# Patient Record
Sex: Male | Born: 1960
Health system: Southern US, Community
[De-identification: ages and names within clinical notes are randomized; demographics above are authoritative.]

## PROBLEM LIST (undated history)

## (undated) DIAGNOSIS — K7460 Unspecified cirrhosis of liver: Secondary | ICD-10-CM

## (undated) HISTORY — DX: Unspecified cirrhosis of liver: K74.60

---

## 2006-06-24 ENCOUNTER — Emergency Department (HOSPITAL_COMMUNITY): Admission: EM | Admit: 2006-06-24 | Discharge: 2006-06-24 | Payer: Self-pay | Admitting: Emergency Medicine

## 2006-06-27 ENCOUNTER — Emergency Department (HOSPITAL_COMMUNITY): Admission: EM | Admit: 2006-06-27 | Discharge: 2006-06-27 | Payer: Self-pay | Admitting: Emergency Medicine

## 2011-08-03 ENCOUNTER — Emergency Department (HOSPITAL_COMMUNITY)
Admission: EM | Admit: 2011-08-03 | Discharge: 2011-08-03 | Disposition: A | Discharge status: Home / Self Care | Payer: Self-pay | Attending: Emergency Medicine | Admitting: Emergency Medicine

## 2011-08-03 ENCOUNTER — Encounter: Payer: Self-pay | Admitting: Emergency Medicine

## 2011-08-03 DIAGNOSIS — L03039 Cellulitis of unspecified toe: Secondary | ICD-10-CM | POA: Insufficient documentation

## 2011-08-03 DIAGNOSIS — L02619 Cutaneous abscess of unspecified foot: Secondary | ICD-10-CM | POA: Insufficient documentation

## 2011-08-03 DIAGNOSIS — M7989 Other specified soft tissue disorders: Secondary | ICD-10-CM | POA: Insufficient documentation

## 2011-08-03 DIAGNOSIS — L02611 Cutaneous abscess of right foot: Secondary | ICD-10-CM

## 2011-08-03 DIAGNOSIS — M79609 Pain in unspecified limb: Secondary | ICD-10-CM | POA: Insufficient documentation

## 2011-08-03 DIAGNOSIS — F17200 Nicotine dependence, unspecified, uncomplicated: Secondary | ICD-10-CM | POA: Insufficient documentation

## 2011-08-03 MED ORDER — SULFAMETHOXAZOLE-TRIMETHOPRIM 800-160 MG PO TABS: 1.0000 | ORAL_TABLET | Freq: Two times a day (BID) | ORAL | Status: AC

## 2011-08-03 MED ORDER — OXYCODONE-ACETAMINOPHEN 5-325 MG PO TABS
1.0000 | ORAL_TABLET | Freq: Once | ORAL | Status: AC
  Administered 2011-08-03: 1 via ORAL
  Filled 2011-08-03: qty 1

## 2011-08-03 MED ORDER — OXYCODONE-ACETAMINOPHEN 5-325 MG PO TABS: 1.0000 | ORAL_TABLET | ORAL | Status: DC | PRN

## 2011-08-03 NOTE — ED Provider Notes (Signed)
History     CSN: [id number]  Arrival date & time 08/03/11  0806   First MD Initiated Contact with Patient 08/03/11 0815      Chief Complaint  Patient presents with  . Foot Pain    Pt has redness and swelling to Right foot for 2-3 weeks.   HPI Pt is a 50 year old male with past medical history notable for a right great toe laceration several years ago.  This was repaired and he has formed a callous there.  Three weeks ago he changed shoes and developed a blister in that area.  It spontaneously drained several days later and, since then, has been non-healing.  He reports a small amount of drainage from the area but increasing redness and pain.  He has been putting peroxide and antibiotic ointment on it and decided to come in due to pain.  He does not report any fevers/chills, n/v/d or other systemic symptoms.  History reviewed. No pertinent past medical history.  History reviewed. No pertinent past surgical history.  History reviewed. No pertinent family history.  History  Substance Use Topics  . Smoking status: Current Everyday Smoker  . Smokeless tobacco: Never Used  . Alcohol Use: Yes      Review of Systems  Constitutional: Negative.   HENT: Negative.   Eyes: Negative.   Respiratory: Negative.   Cardiovascular: Negative.   Gastrointestinal: Negative.   Genitourinary: Negative.   Musculoskeletal:       Pain in the area of the right great toe, swelling there and erythema  Skin:       See MSK  Neurological: Negative.   Hematological: Negative.   Psychiatric/Behavioral: Negative.     Allergies  Review of patient's allergies indicates no known allergies.  Home Medications  No current outpatient prescriptions on file.  BP 110/95  Pulse 102  Temp(Src) 98.3 F (36.8 C) (Oral)  Resp 18  SpO2 97%  Physical Exam  Constitutional: He is oriented to person, place, and time. He appears well-developed and well-nourished.  HENT:  Head: Normocephalic and atraumatic.    Right Ear: External ear normal.  Left Ear: External ear normal.  Nose: Nose normal.  Mouth/Throat: Oropharynx is clear and moist.  Eyes: Conjunctivae and EOM are normal.  Neck: Normal range of motion.  Cardiovascular: Normal rate, regular rhythm, normal heart sounds and intact distal pulses.   Pulmonary/Chest: Effort normal and breath sounds normal.  Abdominal: Soft. Bowel sounds are normal.  Musculoskeletal:       Feet:  Neurological: He is alert and oriented to person, place, and time. No cranial nerve deficit.  Skin: Skin is warm and dry.  Psychiatric: He has a normal mood and affect.    ED Course  INCISION AND DRAINAGE Date/Time: 08/03/2011 10:00 AM Performed by: Majel Homer Authorized by: Majel Homer Consent: Verbal consent obtained. Written consent not obtained. Risks and benefits: risks, benefits and alternatives were discussed Consent given by: patient Patient understanding: patient states understanding of the procedure being performed Patient consent: the patient's understanding of the procedure matches consent given Procedure consent: procedure consent matches procedure scheduled Relevant documents: relevant documents present and verified Site marked: the operative site was marked Imaging studies: imaging studies not available Patient identity confirmed: verbally with patient and arm band Time out: Immediately prior to procedure a "time out" was called to verify the correct patient, procedure, equipment, support staff and site/side marked as required. Type: abscess Body area: lower extremity Location details: left big toe Local  anesthetic: lidocaine 2% without epinephrine Anesthetic total: 2.5 ml Patient sedated: no Scalpel size: 11 Needle gauge: 22 Incision type: single straight Complexity: simple Drainage: bloody and purulent Drainage amount: scant Wound treatment: wound left open Packing material: none Patient tolerance: Patient tolerated the procedure  well with no immediate complications.   (including critical care time)  Labs Reviewed - No data to display No results found.   No diagnosis found.    MDM  I+D performed.  Will send home with pain meds and Abx for 7 days.        Majel Homer, MD 08/03/11 [phone]

## 2011-08-03 NOTE — ED Provider Notes (Signed)
I saw and evaluated the patient, reviewed the resident's note and I agree with the findings and plan. 50 year old male complains of pain and swelling over right great toe for a few days.  Denies trauma.  No fever.  No significant past medical history.  He says it began as a blister and progressed.  On examination.  He appears in moderate discomfort.  There is significant erythema.  Tenderness over the right first MTP joint.  There is purulence discharge.  Overlying lesion.  Differential is an abscess versus gallops.  We will perform an I&D and then placed on antibiotics and analgesics.  Nicholes Stairs, MD 08/03/11 1018

## 2011-08-03 NOTE — ED Notes (Signed)
Pt presents with redness and swelling to right foot/big toe area ongoing x 2-3 weeks.

## 2011-08-03 NOTE — ED Notes (Signed)
Pt staes that he has large bump on top  Of  rt foot  X 3 days  Has gotten increasingly swollon and hot to touch has green pus pus

## 2011-08-03 NOTE — ED Provider Notes (Signed)
I saw and evaluated the patient, reviewed the resident's note and I agree with the findings and plan.  Nicholes Stairs, MD 08/03/11 1025

## 2011-08-09 ENCOUNTER — Emergency Department (INDEPENDENT_AMBULATORY_CARE_PROVIDER_SITE_OTHER)
Admission: EM | Admit: 2011-08-09 | Discharge: 2011-08-09 | Disposition: A | Discharge status: Home / Self Care | Payer: Self-pay | Source: Home / Self Care | Attending: Emergency Medicine | Admitting: Emergency Medicine

## 2011-08-09 ENCOUNTER — Encounter (HOSPITAL_COMMUNITY): Payer: Self-pay | Admitting: *Deleted

## 2011-08-09 DIAGNOSIS — L02619 Cutaneous abscess of unspecified foot: Secondary | ICD-10-CM

## 2011-08-09 DIAGNOSIS — L03115 Cellulitis of right lower limb: Secondary | ICD-10-CM

## 2011-08-09 MED ORDER — DOXYCYCLINE HYCLATE 100 MG PO TABS: 100.0000 mg | ORAL_TABLET | Freq: Two times a day (BID) | ORAL | Status: AC

## 2011-08-09 MED ORDER — OXYCODONE-ACETAMINOPHEN 5-325 MG PO TABS: 1.0000 | ORAL_TABLET | ORAL | Status: AC | PRN

## 2011-08-09 NOTE — ED Notes (Signed)
Pt  Seen  6     Days  Ago  Had  I   And  D       Pt      Took  A  Course  Of  Anti  Biotics         Pt  Reports  The  Foot  Is  Swollen  And  Not  Getting  Any  Better

## 2011-08-09 NOTE — ED Provider Notes (Signed)
Edwin West is a 50 year old male with a right foot cellulitis. He was evaluated on 25 December the emergency room for abscess. The abscess was drained and he was prescribed Bactrim and Percocet for one week. He completed his course of antibiotics and pain medicines yesterday. He notes that his pain returned upon cessation of antibiotics.  He notes that the swelling did improve with antibiotics as did the redness of his foot.  He denies any fevers or chills.  He is able to walk but notes that his foot is swollen enough that he can't get into regular shoes.    PMH reviewed.  ROS as above otherwise neg Medications reviewed. No current facility-administered medications for this encounter.   Current Outpatient Prescriptions  Medication Sig Dispense Refill  . doxycycline (VIBRA-TABS) 100 MG tablet Take 1 tablet (100 mg total) by mouth 2 (two) times daily.  20 tablet  0  . oxyCODONE-acetaminophen (ROXICET) 5-325 MG per tablet Take 1 tablet by mouth every 4 (four) hours as needed for pain.  25 tablet  0  . sulfamethoxazole-trimethoprim (BACTRIM DS) 800-160 MG per tablet Take 1 tablet by mouth 2 (two) times daily.  14 tablet  0    Exam:  BP 114/78  Pulse 84  Temp(Src) 98.3 F (36.8 C) (Oral)  Resp 18  SpO2 98% Gen: Well NAD Lungs: CTABL Nl WOB Heart: RRR no MRG Exts: Right foot swollen with eschar on the dorsum of right first MTP.  Small area of erythema surrounding.  No fluctuance or induration noted.  Tender to palpation. All 5 toes are neurovascularly intact with good capillary refill.  Normal foot motion.  Assessment and plan: 50 year old male with partially treated cellulitis.  I think his treatment course was not long enough.  Plan to use doxycycline twice a day for 10 days and prescribe pain medicine as needed.  Additionally I provided information for Dr. Lorenso Quarry office so that he may schedule an appointment to be seen in the next few days.  I went over red flag signs or symptoms with Mr.  Heffington in detail. I discussed signs of worsening cellulitis and abscess or systemic bacterial infection. He expresses understanding.  I specifically said that if he does not improve in the next few days he should see a doctor.    Edwin West 08/09/11 2016

## 2011-08-09 NOTE — ED Provider Notes (Signed)
Medical screening examination/treatment/procedure(s) were performed by a resident physician and as supervising physician I was immediately available for consultation/collaboration.  Hillery Hunter, MD 08/09/11 [phone]

## 2011-08-09 NOTE — ED Notes (Signed)
Called in lobby x1.  

## 2017-02-17 ENCOUNTER — Encounter (HOSPITAL_COMMUNITY): Payer: Self-pay | Admitting: Emergency Medicine

## 2017-02-17 ENCOUNTER — Ambulatory Visit (HOSPITAL_COMMUNITY)
Admission: EM | Admit: 2017-02-17 | Discharge: 2017-02-17 | Disposition: A | Discharge status: Home / Self Care | Payer: Commercial Managed Care - PPO | Attending: Emergency Medicine | Admitting: Emergency Medicine

## 2017-02-17 DIAGNOSIS — R110 Nausea: Secondary | ICD-10-CM

## 2017-02-17 DIAGNOSIS — R319 Hematuria, unspecified: Secondary | ICD-10-CM

## 2017-02-17 DIAGNOSIS — N200 Calculus of kidney: Secondary | ICD-10-CM

## 2017-02-17 DIAGNOSIS — R1010 Upper abdominal pain, unspecified: Secondary | ICD-10-CM | POA: Diagnosis not present

## 2017-02-17 LAB — POCT URINALYSIS DIP (DEVICE)
BILIRUBIN URINE: NEGATIVE
GLUCOSE, UA: NEGATIVE mg/dL
KETONES UR: NEGATIVE mg/dL
LEUKOCYTES UA: NEGATIVE
Nitrite: NEGATIVE
Protein, ur: NEGATIVE mg/dL
SPECIFIC GRAVITY, URINE: 1.02 (ref 1.005–1.030)
Urobilinogen, UA: 1 mg/dL (ref 0.0–1.0)
pH: 7 (ref 5.0–8.0)

## 2017-02-17 MED ORDER — KETOROLAC TROMETHAMINE 60 MG/2ML IM SOLN
60.0000 mg | Freq: Once | INTRAMUSCULAR | Status: AC
  Administered 2017-02-17: 60 mg via INTRAMUSCULAR

## 2017-02-17 MED ORDER — TAMSULOSIN HCL 0.4 MG PO CAPS: 0.4000 mg | ORAL_CAPSULE | Freq: Every day | ORAL | 0 refills | Status: DC

## 2017-02-17 MED ORDER — ONDANSETRON 4 MG PO TBDP
ORAL_TABLET | ORAL | Status: AC
  Filled 2017-02-17: qty 1

## 2017-02-17 MED ORDER — ONDANSETRON 4 MG PO TBDP
4.0000 mg | ORAL_TABLET | Freq: Once | ORAL | Status: AC
  Administered 2017-02-17: 4 mg via ORAL

## 2017-02-17 MED ORDER — KETOROLAC TROMETHAMINE 60 MG/2ML IM SOLN
INTRAMUSCULAR | Status: AC
  Filled 2017-02-17: qty 2

## 2017-02-17 MED ORDER — HYDROCODONE-ACETAMINOPHEN 5-325 MG PO TABS: 1.0000 | ORAL_TABLET | ORAL | 0 refills | Status: DC | PRN

## 2017-02-17 MED ORDER — ONDANSETRON 4 MG PO TBDP: 4.0000 mg | ORAL_TABLET | Freq: Three times a day (TID) | ORAL | 0 refills | Status: DC | PRN

## 2017-02-17 NOTE — Discharge Instructions (Signed)
If pain worsens or if unable to void please go to ED

## 2017-02-17 NOTE — ED Provider Notes (Signed)
CSN: [id number]     Arrival date & time 02/17/17  1835 History   None    Chief Complaint  Patient presents with  . Abdominal Pain   (Consider location/radiation/quality/duration/timing/severity/associated sxs/prior Treatment) Patient c/o abdominal pain on right side and nausea.  Patient states the pain is persistent and severe.  He is having nausea.   The history is provided by the patient.  Abdominal Pain  Pain location:  R flank Pain quality: aching   Pain severity:  Severe Onset quality:  Sudden Duration:  4 hours Timing:  Constant Progression:  Worsening Chronicity:  New Ineffective treatments:  None tried Associated symptoms: fatigue and nausea   Risk factors: obesity     History reviewed. No pertinent past medical history. History reviewed. No pertinent surgical history. History reviewed. No pertinent family history. Social History  Substance Use Topics  . Smoking status: Current Every Day Smoker  . Smokeless tobacco: Never Used  . Alcohol use Yes    Review of Systems  Constitutional: Positive for fatigue.  HENT: Negative.   Eyes: Negative.   Respiratory: Negative.   Cardiovascular: Negative.   Gastrointestinal: Positive for abdominal pain and nausea.  Endocrine: Negative.   Genitourinary: Negative.   Musculoskeletal: Negative.   Allergic/Immunologic: Negative.   Neurological: Negative.   Hematological: Negative.   Psychiatric/Behavioral: Negative.     Allergies  Patient has no known allergies.  Home Medications   Prior to Admission medications   Medication Sig Start Date End Date Taking? Authorizing Provider  HYDROcodone-acetaminophen (NORCO/VICODIN) 5-325 MG tablet Take 1-2 tablets by mouth every 4 (four) hours as needed. 02/17/17   Deatra Canter, FNP  ondansetron (ZOFRAN ODT) 4 MG disintegrating tablet Take 1 tablet (4 mg total) by mouth every 8 (eight) hours as needed for nausea or vomiting. 02/17/17   Deatra Canter, FNP  tamsulosin  (FLOMAX) 0.4 MG CAPS capsule Take 1 capsule (0.4 mg total) by mouth daily. 02/17/17   Deatra Canter, FNP   Meds Ordered and Administered this Visit   Medications  ketorolac (TORADOL) injection 60 mg (not administered)  ondansetron (ZOFRAN-ODT) disintegrating tablet 4 mg (not administered)    BP 116/75 (BP Location: Right Arm)   Pulse 94   Temp 98.1 F (36.7 C) (Oral)   Resp 20   SpO2 97%  No data found.   Physical Exam  Constitutional: He is oriented to person, place, and time. He appears well-developed and well-nourished.  HENT:  Head: Normocephalic and atraumatic.  Eyes: Pupils are equal, round, and reactive to light. Conjunctivae and EOM are normal.  Neck: Normal range of motion. Neck supple.  Cardiovascular: Normal rate, regular rhythm and normal heart sounds.   Pulmonary/Chest: Effort normal and breath sounds normal.  Abdominal: Soft. Bowel sounds are normal. There is tenderness.  Right abdomen tender.  Neg Murphy's  Genitourinary:  Genitourinary Comments: Positive right CVA tenderness.  Neurological: He is alert and oriented to person, place, and time.  Nursing note and vitals reviewed.   Urgent Care Course     Procedures (including critical care time)  Labs Review Labs Reviewed  POCT URINALYSIS DIP (DEVICE) - Abnormal; Notable for the following:       Result Value   Hgb urine dipstick TRACE (*)    All other components within normal limits    Imaging Review No results found.   Visual Acuity Review  Right Eye Distance:   Left Eye Distance:   Bilateral Distance:    Right Eye Near:  Left Eye Near:    Bilateral Near:         MDM   1. Hematuria, unspecified type   2. Pain of upper abdomen   3. Kidney stone   4. Nausea without vomiting    Toradol 60 mg IM Zofran 4mg  po now  Norco 5/325 one to two po q 6 hours prn pain #12 Zofran ODT 4mg  one po tid prn #21  Push po fluids     Deatra CanterOxford, William J, [facility] LtdFNP 02/17/17 2043

## 2017-02-17 NOTE — ED Triage Notes (Signed)
Pt here for constant RUQ pain onset last night associated w/nasuea  Denies fevers, chills  Pain increases w/activity, cough, pressure, w/food  No hx of abd surgeries   LBM = today, no abnormalities noted.   A&O x4... NAD... Ambulatory... Slow gait

## 2017-02-17 NOTE — ED Notes (Signed)
Patient verbalized understanding of discharge instructions and denies any further needs or questions at this time. VS stable. Patient ambulatory with steady gait.  

## 2017-05-10 ENCOUNTER — Encounter (HOSPITAL_COMMUNITY): Payer: Self-pay | Admitting: Family Medicine

## 2017-05-10 ENCOUNTER — Ambulatory Visit (HOSPITAL_COMMUNITY)
Admission: EM | Admit: 2017-05-10 | Discharge: 2017-05-10 | Disposition: A | Discharge status: Home / Self Care | Payer: Commercial Managed Care - PPO | Attending: Family Medicine | Admitting: Family Medicine

## 2017-05-10 ENCOUNTER — Ambulatory Visit (INDEPENDENT_AMBULATORY_CARE_PROVIDER_SITE_OTHER): Payer: Commercial Managed Care - PPO

## 2017-05-10 DIAGNOSIS — R911 Solitary pulmonary nodule: Secondary | ICD-10-CM | POA: Diagnosis not present

## 2017-05-10 DIAGNOSIS — J40 Bronchitis, not specified as acute or chronic: Secondary | ICD-10-CM

## 2017-05-10 DIAGNOSIS — R05 Cough: Secondary | ICD-10-CM | POA: Diagnosis not present

## 2017-05-10 DIAGNOSIS — R0602 Shortness of breath: Secondary | ICD-10-CM

## 2017-05-10 MED ORDER — AZITHROMYCIN 250 MG PO TABS: 250.0000 mg | ORAL_TABLET | Freq: Every day | ORAL | 0 refills | Status: DC

## 2017-05-10 MED ORDER — HYDROCODONE-HOMATROPINE 5-1.5 MG/5ML PO SYRP: 5.0000 mL | ORAL_SOLUTION | Freq: Four times a day (QID) | ORAL | 0 refills | Status: DC | PRN

## 2017-05-10 NOTE — ED Triage Notes (Addendum)
Patient reports right sided rib cage pain radiating into back, onset on Saturday. Patient reports he had cough/cold like symptoms x 2 weeks. No fevers.   Denies injury. Pain increases with coughing and certain movement.

## 2017-05-10 NOTE — Discharge Instructions (Signed)
You have a pulmonary nodule in your left lung. This is not causing her pain but it does require further investigation when you're feeling better.  Please make an appointment with your primary care doctor or the clinic listed below.

## 2017-05-10 NOTE — ED Provider Notes (Signed)
[facility] CARE CENTER   [id number] 05/10/17 Arrival Time: 1330   SUBJECTIVE:  Edwin West is a 56 y.o. male who presents to the urgent care with complaint of right sided rib cage pain radiating into back, onset on Saturday. Patient reports he had cough/cold like symptoms x 2 weeks. No fevers.   Patient is a smoker.  He works as a Academic librarian. Pain is described as burning and sharp, coming on as the cough started to worsen three days ago.  No shortness of breath     History reviewed. No pertinent past medical history. History reviewed. No pertinent family history. Social History   Social History  . Marital status: Single    Spouse name: N/A  . Number of children: N/A  . Years of education: N/A   Occupational History  . Not on file.   Social History Main Topics  . Smoking status: Current Every Day Smoker  . Smokeless tobacco: Never Used  . Alcohol use Yes  . Drug use: Yes    Types: Marijuana  . Sexual activity: Not on file   Other Topics Concern  . Not on file   Social History Narrative  . No narrative on file   No outpatient prescriptions have been marked as taking for the 05/10/17 encounter [facility] Encounter).   No Known Allergies    ROS: As per HPI, remainder of ROS negative.   OBJECTIVE:   Vitals:   05/10/17 1350  BP: (!) 145/84  Pulse: 94  Resp: 18  Temp: 98 F (36.7 C)  TempSrc: Oral  SpO2: 95%     General appearance: alert; no distress;  Patient seated apprehensively avoiding deep breath Eyes: PERRL; EOMI; conjunctiva normal HENT: normocephalic; atraumatic; TMs normal, canal normal, external ears normal without trauma; nasal mucosa normal; oral mucosa normal Neck: supple Lungs: bibasilar rales with tender right mid and lower ribs anteriorly and laterally Heart: regular rate and rhythm Back: no CVA tenderness Extremities: no cyanosis or edema; symmetrical with no gross deformities Skin: warm and dry Neurologic: normal gait; grossly  normal Psychological: alert and cooperative; normal mood and affect      Labs:  Results for orders placed or performed during the hospital encounter of 02/17/17  POCT urinalysis dip (device)  Result Value Ref Range   Glucose, UA NEGATIVE NEGATIVE mg/dL   Bilirubin Urine NEGATIVE NEGATIVE   Ketones, ur NEGATIVE NEGATIVE mg/dL   Specific Gravity, Urine 1.020 1.005 - 1.030   Hgb urine dipstick TRACE (A) NEGATIVE   pH 7.0 5.0 - 8.0   Protein, ur NEGATIVE NEGATIVE mg/dL   Urobilinogen, UA 1.0 0.0 - 1.0 mg/dL   Nitrite NEGATIVE NEGATIVE   Leukocytes, UA NEGATIVE NEGATIVE    Labs Reviewed - No data to display  Dg Chest 2 View  Result Date: 05/10/2017 CLINICAL DATA:  Cough for 1 week, worsened over the last 3 days. Shortness of breath. EXAM: CHEST  2 VIEW COMPARISON:  None. FINDINGS: No pneumothorax. Coarsened interstitium is seen bilaterally. A nodular density in the lateral left lung base measures up to 7.5 mm. No other nodules or masses. No focal infiltrates. Minimal atelectasis in the lateral left lung base. The heart, hila, and mediastinum are normal. IMPRESSION: 1. There is a 7.5 mm nodular density in the lateral left lung base. Given history of smoking, recommend a CT scan for better evaluation if there are no previous studies for comparison. 2. Coarsened interstitium likely represents bronchitic change or sequela of smoking. 3. No other acute abnormalities.  These results will be called to the ordering clinician or representative by the Radiologist Assistant, and communication documented in the PACS or zVision Dashboard. Electronically Signed   By: Gerome Sam III M.D   On: 05/10/2017 14:25       ASSESSMENT & PLAN:  1. Bronchitis   2. Solitary pulmonary nodule     Meds ordered this encounter  Medications  . HYDROcodone-homatropine (HYDROMET) 5-1.5 MG/5ML syrup    Sig: Take 5 mLs by mouth every 6 (six) hours as needed for cough.    Dispense:  60 mL    Refill:  0  .  azithromycin (ZITHROMAX) 250 MG tablet    Sig: Take 1 tablet (250 mg total) by mouth daily. Take first 2 tablets together, then 1 every day until finished.    Dispense:  6 tablet    Refill:  0    Reviewed expectations re: course of current medical issues. Questions answered. Outlined signs and symptoms indicating need for more acute intervention. Patient verbalized understanding. After Visit Summary given.    Procedures:      Elvina Sidle, MD 05/10/17 1438

## 2017-05-11 ENCOUNTER — Telehealth (HOSPITAL_COMMUNITY): Payer: Self-pay | Admitting: Emergency Medicine

## 2017-05-11 NOTE — Telephone Encounter (Signed)
Pt called  Sts he was seen yest (05/10/17) and was told by Dr. Milus Glazier to call back if he was still in pain so he could call in pain meds  Notified pt that he is not here and wont be here until 10/14  Pt was upset... sts he now has to return to [facility] ED or here at the [facility] and waste more of his money.   I apologized for the confusion... But hanged the phone.

## 2017-09-06 DIAGNOSIS — B349 Viral infection, unspecified: Secondary | ICD-10-CM | POA: Diagnosis not present

## 2017-09-06 DIAGNOSIS — J029 Acute pharyngitis, unspecified: Secondary | ICD-10-CM | POA: Diagnosis not present

## 2018-04-03 ENCOUNTER — Emergency Department
Admission: EM | Admit: 2018-04-03 | Discharge: 2018-04-03 | Disposition: A | Discharge status: Home / Self Care | Payer: Commercial Managed Care - PPO | Attending: Emergency Medicine | Admitting: Emergency Medicine

## 2018-04-03 ENCOUNTER — Emergency Department: Payer: Commercial Managed Care - PPO

## 2018-04-03 ENCOUNTER — Encounter: Payer: Self-pay | Admitting: Emergency Medicine

## 2018-04-03 DIAGNOSIS — R188 Other ascites: Secondary | ICD-10-CM | POA: Diagnosis not present

## 2018-04-03 DIAGNOSIS — K7460 Unspecified cirrhosis of liver: Secondary | ICD-10-CM

## 2018-04-03 DIAGNOSIS — R140 Abdominal distension (gaseous): Secondary | ICD-10-CM

## 2018-04-03 DIAGNOSIS — J439 Emphysema, unspecified: Secondary | ICD-10-CM | POA: Diagnosis not present

## 2018-04-03 DIAGNOSIS — R609 Edema, unspecified: Secondary | ICD-10-CM | POA: Diagnosis not present

## 2018-04-03 DIAGNOSIS — F17210 Nicotine dependence, cigarettes, uncomplicated: Secondary | ICD-10-CM | POA: Diagnosis not present

## 2018-04-03 DIAGNOSIS — Z79899 Other long term (current) drug therapy: Secondary | ICD-10-CM | POA: Diagnosis not present

## 2018-04-03 DIAGNOSIS — R600 Localized edema: Secondary | ICD-10-CM | POA: Diagnosis not present

## 2018-04-03 LAB — CBC
HCT: 30.2 % — ABNORMAL LOW (ref 40.0–52.0)
HEMOGLOBIN: 10.2 g/dL — AB (ref 13.0–18.0)
MCH: 35.8 pg — ABNORMAL HIGH (ref 26.0–34.0)
MCHC: 33.7 g/dL (ref 32.0–36.0)
MCV: 106.2 fL — ABNORMAL HIGH (ref 80.0–100.0)
PLATELETS: 209 10*3/uL (ref 150–440)
RBC: 2.84 MIL/uL — ABNORMAL LOW (ref 4.40–5.90)
RDW: 15.6 % — ABNORMAL HIGH (ref 11.5–14.5)
WBC: 5.9 10*3/uL (ref 3.8–10.6)

## 2018-04-03 LAB — COMPREHENSIVE METABOLIC PANEL
ALK PHOS: 503 U/L — AB (ref 38–126)
ALT: 31 U/L (ref 0–44)
AST: 52 U/L — AB (ref 15–41)
Albumin: 2.7 g/dL — ABNORMAL LOW (ref 3.5–5.0)
Anion gap: 5 (ref 5–15)
BILIRUBIN TOTAL: 1.3 mg/dL — AB (ref 0.3–1.2)
BUN: 13 mg/dL (ref 6–20)
CALCIUM: 8.1 mg/dL — AB (ref 8.9–10.3)
CHLORIDE: 109 mmol/L (ref 98–111)
CO2: 23 mmol/L (ref 22–32)
CREATININE: 0.69 mg/dL (ref 0.61–1.24)
Glucose, Bld: 108 mg/dL — ABNORMAL HIGH (ref 70–99)
Potassium: 3.8 mmol/L (ref 3.5–5.1)
Sodium: 137 mmol/L (ref 135–145)
TOTAL PROTEIN: 6.7 g/dL (ref 6.5–8.1)

## 2018-04-03 LAB — URINALYSIS, COMPLETE (UACMP) WITH MICROSCOPIC
BACTERIA UA: NONE SEEN
Bilirubin Urine: NEGATIVE
GLUCOSE, UA: NEGATIVE mg/dL
HGB URINE DIPSTICK: NEGATIVE
Ketones, ur: NEGATIVE mg/dL
Leukocytes, UA: NEGATIVE
NITRITE: NEGATIVE
PH: 5 (ref 5.0–8.0)
Protein, ur: 100 mg/dL — AB
SPECIFIC GRAVITY, URINE: 1.029 (ref 1.005–1.030)

## 2018-04-03 LAB — LIPASE, BLOOD: Lipase: 32 U/L (ref 11–51)

## 2018-04-03 MED ORDER — FUROSEMIDE 20 MG PO TABS: 20.0000 mg | ORAL_TABLET | Freq: Every day | ORAL | 0 refills | Status: DC

## 2018-04-03 MED ORDER — FUROSEMIDE 10 MG/ML IJ SOLN
60.0000 mg | Freq: Once | INTRAMUSCULAR | Status: AC
  Administered 2018-04-03: 60 mg via INTRAVENOUS
  Filled 2018-04-03: qty 8

## 2018-04-03 NOTE — ED Notes (Signed)
Awaiting GI appointment to be set up before discharge.

## 2018-04-03 NOTE — ED Provider Notes (Signed)
[facility] Emergency Department Provider Note   ____________________________________________   I have reviewed the triage vital signs and the nursing notes.   HISTORY  Chief Complaint Leg Swelling and Shortness of Breath   History limited by: Not Limited   HPI Edwin West is a 57 y.o. male who presents to the emergency department today because of concern for swelling.  He states the swelling started about a week ago.  He states he has had some swelling in his legs in the past but over the past week not only has had swelling in the legs but also in his abdomen.  He states this has been coming by some shortness of breath.  It is worse when he lies flat.  He denies any history of liver kidney or heart disease.  States he does have a fairly significant alcohol use history.  Denies any fevers.  Denies any abdominal pain.   Per medical record review patient has a history of alcohol use  History reviewed. No pertinent past medical history.  There are no active problems to display for this patient.   History reviewed. No pertinent surgical history.  Prior to Admission medications   Medication Sig Start Date End Date Taking? Authorizing Provider  azithromycin (ZITHROMAX) 250 MG tablet Take 1 tablet (250 mg total) by mouth daily. Take first 2 tablets together, then 1 every day until finished. 05/10/17   Elvina SidleLauenstein, Kurt, MD  HYDROcodone-homatropine (HYDROMET) 5-1.5 MG/5ML syrup Take 5 mLs by mouth every 6 (six) hours as needed for cough. 05/10/17   Elvina SidleLauenstein, Kurt, MD  tamsulosin (FLOMAX) 0.4 MG CAPS capsule Take 1 capsule (0.4 mg total) by mouth daily. 02/17/17   Deatra Canterxford, William J, FNP    Allergies Patient has no known allergies.  No family history on file.  Social History Social History   Tobacco Use  . Smoking status: Current Every Day Smoker  . Smokeless tobacco: Never Used  Substance Use Topics  . Alcohol use: Yes  . Drug use: Yes    Types:  Marijuana    Review of Systems Constitutional: No fever/chills Eyes: No visual changes. ENT: No sore throat. Cardiovascular: Denies chest pain. Respiratory: Positive for shortness of breath. Gastrointestinal: Positive for abdominal distention. Genitourinary: Negative for dysuria. Musculoskeletal: Negative for back pain. Skin: Negative for rash. Neurological: Negative for headaches, focal weakness or numbness.  ____________________________________________   PHYSICAL EXAM:  VITAL SIGNS: ED Triage Vitals  Enc Vitals Group     BP 04/03/18 1126 115/81     Pulse Rate 04/03/18 1126 81     Resp 04/03/18 1126 20     Temp 04/03/18 1126 97.6 F (36.4 C)     Temp Source 04/03/18 1126 Oral     SpO2 04/03/18 1126 100 %     Weight 04/03/18 1126 235 lb (106.6 kg)     Height 04/03/18 1126 5\' 9"  (1.753 m)     Head Circumference --      Peak Flow --      Pain Score 04/03/18 1130 3   Constitutional: Alert and oriented.  Eyes: Conjunctivae are normal.  ENT      Head: Normocephalic and atraumatic.      Nose: No congestion/rhinnorhea.      Mouth/Throat: Mucous membranes are moist.      Neck: No stridor. Hematological/Lymphatic/Immunilogical: No cervical lymphadenopathy. Cardiovascular: Normal rate, regular rhythm.  No murmurs, rubs, or gallops.  Respiratory: Normal respiratory effort without tachypnea nor retractions. Breath sounds are clear and equal  bilaterally. No wheezes/rales/rhonchi. Gastrointestinal: Soft and distended. Non tender.  Genitourinary: Deferred Musculoskeletal: Normal range of motion in all extremities. Positive bilateral pitting edema.  Neurologic:  Normal speech and language. No gross focal neurologic deficits are appreciated.  Skin:  Skin is warm, dry and intact. No rash noted. Psychiatric: Mood and affect are normal. Speech and behavior are normal. Patient exhibits appropriate insight and judgment.  ____________________________________________    LABS  (pertinent positives/negatives)  Lipase 32 CBC wbc 5.9, hgb 10.2, plt 209 CMP na 137, k 3.8, glu 108, cr 0.69 UA cloudy, wbc 6-10   ____________________________________________   EKG  I, Phineas Semen, attending physician, personally viewed and interpreted this EKG  EKG Time: 1138 Rate: 78 Rhythm: normal sinus rhythm Axis: right axis deviation Intervals: qtc 483 QRS: narrow ST changes: no st elevation Impression: abnormal ekg   ____________________________________________    RADIOLOGY  CXR Mild interstitial edema  ____________________________________________   PROCEDURES  Procedures  ____________________________________________   INITIAL IMPRESSION / ASSESSMENT AND PLAN / ED COURSE  Pertinent labs & imaging results that were available during my care of the patient were reviewed by me and considered in my medical decision making (see chart for details).   Patient presented to the emergency department today because of concerns for increased swelling.  On exam patient had significant peripheral edema and abdominal distention.  Patient had a history of drinking so I did have concern for cirrhosis.  Less concern for heart failure kidney failure.  Patient's ultrasound does show cirrhosis and ascites.  Patient was given Lasix here in the emergency department.  At this point I think this is likely the cause of the patient's symptoms.  Will try to contact GI to see if we cannot get patient close outpatient follow-up for work-up and possible paracentesis  ____________________________________________   FINAL CLINICAL IMPRESSION(S) / ED DIAGNOSES  Final diagnoses:  Abdominal distension  Peripheral edema  Cirrhosis of liver with ascites, unspecified hepatic cirrhosis type [facility])     Note: This dictation was prepared with Dragon dictation. Any transcriptional errors that result from this process are unintentional     Phineas Semen, MD 04/03/18 1623

## 2018-04-03 NOTE — Discharge Instructions (Addendum)
Please seek medical attention for any high fevers, chest pain, shortness of breath, change in behavior, persistent vomiting, bloody stool or any other new or concerning symptoms.  

## 2018-04-03 NOTE — ED Triage Notes (Signed)
Patient presents to the ED with swelling in his feet and legs and abdomen with shortness of breath x 1 week.  Patient reports being able to sleep lying down.  Patient is in no obvious distress at this time.  Denies pain.

## 2018-04-03 NOTE — ED Notes (Signed)
Patient transported to Ultrasound 

## 2018-04-06 ENCOUNTER — Encounter: Payer: Self-pay | Admitting: Gastroenterology

## 2018-04-06 ENCOUNTER — Ambulatory Visit: Payer: Commercial Managed Care - PPO | Admitting: Gastroenterology

## 2018-04-06 VITALS — BP 109/70 | HR 86 | Ht 69.0 in | Wt 274.0 lb

## 2018-04-06 DIAGNOSIS — K7031 Alcoholic cirrhosis of liver with ascites: Secondary | ICD-10-CM | POA: Diagnosis not present

## 2018-04-06 DIAGNOSIS — K7460 Unspecified cirrhosis of liver: Secondary | ICD-10-CM | POA: Diagnosis not present

## 2018-04-06 DIAGNOSIS — R188 Other ascites: Secondary | ICD-10-CM

## 2018-04-06 MED ORDER — ALBUMIN HUMAN 25 % IV SOLN: 25.0000 g | Freq: Once | INTRAVENOUS | 0 refills | Status: AC

## 2018-04-06 NOTE — Progress Notes (Signed)
[facility] [address].  [address]  [place], [place] [address]  Main: [phone]  Fax: [phone]   Gastroenterology Consultation  Referring Provider:     Dr. Derrill Kay Primary Care Physician:  Patient, No Pcp Per Primary Gastroenterologist:  Edwin West. [facility] West Reason for Consultation:     Abdominal distention        HPI:    Chief Complaint  Patient presents with  . New Patient (Initial Visit)    ED referral-abdominal distention, Cirrhosis of liver with ascites    Edwin West West is a 57 y.o. y/o male referred for consultation & management  by Edwin West. Derrill Kay.  Patient with history of daily alcohol abuse referred by ER due to abdominal distention.  Patient reports chronic history of alcohol use, used to drink liquor then changed to beer, and only stopped drinking about a few weeks ago.  Went to the ER on 8/26 due to abdominal distention and peripheral edema.  He was given Lasix and was discharged with outpatient follow-up.  Patient reports noting abdominal distention starting 3 weeks ago.  Denies any abdominal pain, but reports abdominal discomfort due to distention.  No episodes of bleeding or confusion.  Does not see a primary care provider and has not seen one in years.  Does not know of any previous past medical problems.  Is not on any other medications besides Lasix that was given to him by the ER.  Past medical history: None  Past surgical history: None  Prior to Admission medications   Medication Sig Start Date End Date Taking? Authorizing Provider  furosemide (LASIX) 20 MG tablet Take 1 tablet (20 mg total) by mouth daily. 04/03/18 04/03/19 Yes Edwin West Semen, MD    Family History  Problem Relation Age of Onset  . Cancer Sister        cervical  . Heart failure Sister   . Cancer Other 49       breast cancer  . Heart failure Other      Social History   Tobacco Use  . Smoking status: Current Every Day Smoker  . Smokeless tobacco: Current User    Substance Use Topics  . Alcohol use: Not Currently  . Drug use: Yes    Types: Marijuana    Allergies as of 04/06/2018  . (No Known Allergies)    Review of Systems:    All systems reviewed and negative except where noted in HPI.   Physical Exam:  BP 109/70   Pulse 86   Ht 5\' 9"  (1.753 m)   Wt 274 lb (124.3 kg)   BMI 40.46 kg/m  No LMP for male patient. Psych:  Alert and cooperative. Normal mood and affect. General:   Alert,  Well-developed, well-nourished, pleasant and cooperative in NAD Head:  Normocephalic and atraumatic. Eyes:  Sclera clear, no icterus.   Conjunctiva pink. Ears:  Normal auditory acuity. Nose:  No deformity, discharge, or lesions. Mouth:  No deformity or lesions,oropharynx pink & moist. Neck:  Supple; no masses or thyromegaly. Lungs:  Respirations even and unlabored.  Clear throughout to auscultation.   No wheezes, crackles, or rhonchi. No acute distress. Heart:  Regular rate and rhythm; no murmurs, clicks, rubs, or gallops. Abdomen:  Normal bowel sounds.  No bruits.  Soft, non-tender and, distended without masses, hepatosplenomegaly or hernias noted.  No guarding or rebound tenderness.    Msk:  Symmetrical without gross deformities. Good, equal movement & strength bilaterally. Pulses:  Normal pulses noted. Extremities:  No  clubbing, 1+ lower extremity edema.  No cyanosis. Neurologic:  Alert and oriented x3;  grossly normal neurologically. Skin:  Intact without significant lesions or rashes. No jaundice. Lymph Nodes:  No significant cervical adenopathy. Psych:  Alert and cooperative. Normal mood and affect.   Labs: CBC    Component Value Date/Time   WBC 5.9 04/03/2018 1142   RBC 2.84 (L) 04/03/2018 1142   HGB 10.2 (L) 04/03/2018 1142   HCT 30.2 (L) 04/03/2018 1142   PLT 209 04/03/2018 1142   MCV 106.2 (H) 04/03/2018 1142   MCH 35.8 (H) 04/03/2018 1142   MCHC 33.7 04/03/2018 1142   RDW 15.6 (H) 04/03/2018 1142   CMP     Component Value  Date/Time   NA 137 04/03/2018 1209   K 3.8 04/03/2018 1209   CL 109 04/03/2018 1209   CO2 23 04/03/2018 1209   GLUCOSE 108 (H) 04/03/2018 1209   BUN 13 04/03/2018 1209   CREATININE 0.69 04/03/2018 1209   CALCIUM 8.1 (L) 04/03/2018 1209   PROT 6.7 04/03/2018 1209   ALBUMIN 2.7 (L) 04/03/2018 1209   AST 52 (H) 04/03/2018 1209   ALT 31 04/03/2018 1209   ALKPHOS 503 (H) 04/03/2018 1209   BILITOT 1.3 (H) 04/03/2018 1209   GFRNONAA >60 04/03/2018 1209   GFRAA >60 04/03/2018 1209    Imaging Studies: Dg Chest 2 View  Result Date: 04/03/2018 CLINICAL DATA:  Shortness of breath and bilateral foot swelling for 1 week. EXAM: CHEST - 2 VIEW COMPARISON:  PA and lateral chest 05/10/2017. FINDINGS: The chest is hyperexpanded with attenuation of the pulmonary vasculature consistent with emphysema. Heart size is upper normal. No pneumothorax. Trace left pleural effusion is noted. There is mild interstitial edema. No acute or focal bony abnormality. IMPRESSION: Mild interstitial edema with a trace left pleural effusion. Emphysema. Electronically Signed   By: [facility] West M.D.   On: 04/03/2018 12:26   US Abdomen Limited Ruq  Result Date: 04/03/2018 CLINICAL DATA:  57 year old male with abdominal distention for 1 week. EXAM: ULTRASOUND ABDOMEN LIMITED RIGHT UPPER QUADRANT COMPARISON:  None. FINDINGS: Gallbladder: No echogenic sludge or stones identified within the gallbladder lumen. Mild gallbladder wall thickening measuring 4-5 millimeters. No sonographic Murphy sign elicited. Common bile duct: Diameter: 6-7 millimeters, upper limits of normal. Liver: Nodular and cirrhotic liver with perihepatic free fluid. No discrete liver lesion. No intrahepatic biliary ductal dilatation. Portal vein is patent on color Doppler imaging with normal direction of blood flow towards the liver. Other findings: Ascites fluid visible in all 4 quadrants. Negative visible right kidney. IMPRESSION: 1. Cirrhotic Liver with ascites.  2. Mild gallbladder wall thickening, likely reactive due to #1. Electronically Signed   By: [facility] West M.D.   On: 04/03/2018 15:07    Assessment and Plan:   Edwin West West is a 57 y.o. y/o male has been referred for abdominal distention  Liver ultrasound consistent with cirrhosis Abdominal distention consistent with ascites Patient has been scheduled for outpatient paracentesis tomorrow, albumin ordered to be given at the time of paracentesis Fluid studies ordered as well  Lab work ordered to complete liver work-up Patient was given a list of primary care providers and was asked to set up primary care as soon as possible He will need an annual physical exam by his primary care provider, likely echo given his lower extremity edema abdominal distention to rule out low EF  Sodium intake should be less than 2000 g a day and this was discussed Patient was  asked to abstain from alcohol use completely and he verbalized understanding Next line no episodes of confusion or bleeding at this time No indication for urgent EGD or colonoscopy at this time  We will await lab work for further management We will start with large volume paracentesis at this time  He only has 7 days of Lasix that was given by the ER We will not refill at this time, given that he will need a large volume paracentesis tomorrow   Ed[facility] West Edwin West BouillonVarnita Jayanth West

## 2018-04-07 ENCOUNTER — Other Ambulatory Visit
Admission: RE | Admit: 2018-04-07 | Discharge: 2018-04-07 | Disposition: A | Discharge status: Home / Self Care | Payer: Commercial Managed Care - PPO | Source: Ambulatory Visit | Attending: Gastroenterology | Admitting: Gastroenterology

## 2018-04-07 ENCOUNTER — Ambulatory Visit
Admission: RE | Admit: 2018-04-07 | Discharge: 2018-04-07 | Disposition: A | Discharge status: Home / Self Care | Payer: Commercial Managed Care - PPO | Source: Ambulatory Visit | Attending: Gastroenterology | Admitting: Gastroenterology

## 2018-04-07 DIAGNOSIS — R188 Other ascites: Principal | ICD-10-CM

## 2018-04-07 DIAGNOSIS — K7031 Alcoholic cirrhosis of liver with ascites: Secondary | ICD-10-CM

## 2018-04-07 DIAGNOSIS — K7460 Unspecified cirrhosis of liver: Secondary | ICD-10-CM

## 2018-04-07 LAB — BODY FLUID CELL COUNT WITH DIFFERENTIAL
Eos, Fluid: 0 %
LYMPHS FL: 32 %
MONOCYTE-MACROPHAGE-SEROUS FLUID: 63 %
Neutrophil Count, Fluid: 5 %
OTHER CELLS FL: 0 %
Total Nucleated Cell Count, Fluid: 87 cu mm

## 2018-04-07 LAB — LACTATE DEHYDROGENASE, PLEURAL OR PERITONEAL FLUID
LD FL: 35 U/L — AB (ref 3–23)
LD FL: 36 U/L — AB (ref 3–23)

## 2018-04-07 LAB — ALBUMIN, PLEURAL OR PERITONEAL FLUID

## 2018-04-07 LAB — COMPREHENSIVE METABOLIC PANEL
ALBUMIN: 3.2 g/dL — AB (ref 3.5–5.0)
ALK PHOS: 473 U/L — AB (ref 38–126)
ALT: 27 U/L (ref 0–44)
ANION GAP: 5 (ref 5–15)
AST: 53 U/L — ABNORMAL HIGH (ref 15–41)
BILIRUBIN TOTAL: 1.3 mg/dL — AB (ref 0.3–1.2)
BUN: 12 mg/dL (ref 6–20)
CO2: 24 mmol/L (ref 22–32)
Calcium: 8 mg/dL — ABNORMAL LOW (ref 8.9–10.3)
Chloride: 106 mmol/L (ref 98–111)
Creatinine, Ser: 0.71 mg/dL (ref 0.61–1.24)
GFR calc Af Amer: >60 mL/min (ref >60)
GFR calc non Af Amer: >60 mL/min (ref >60)
GLUCOSE: 91 mg/dL (ref 70–99)
Potassium: 3.4 mmol/L — ABNORMAL LOW (ref 3.5–5.1)
SODIUM: 135 mmol/L (ref 135–145)
TOTAL PROTEIN: 6.9 g/dL (ref 6.5–8.1)

## 2018-04-07 LAB — PROTEIN, PLEURAL OR PERITONEAL FLUID: Total protein, fluid: <3 g/dL

## 2018-04-07 LAB — PROTIME-INR
INR: 1.03
Prothrombin Time: 13.4 seconds (ref 11.4–15.2)

## 2018-04-07 LAB — CBC
HEMATOCRIT: 27.6 % — AB (ref 40.0–52.0)
Hemoglobin: 9.5 g/dL — ABNORMAL LOW (ref 13.0–18.0)
MCH: 35.3 pg — ABNORMAL HIGH (ref 26.0–34.0)
MCHC: 34.4 g/dL (ref 32.0–36.0)
MCV: 102.7 fL — AB (ref 80.0–100.0)
Platelets: 179 10*3/uL (ref 150–440)
RBC: 2.69 MIL/uL — ABNORMAL LOW (ref 4.40–5.90)
RDW: 15.2 % — AB (ref 11.5–14.5)
WBC: 5.1 10*3/uL (ref 3.8–10.6)

## 2018-04-07 LAB — PATHOLOGIST SMEAR REVIEW

## 2018-04-07 LAB — FERRITIN: Ferritin: 13 ng/mL — ABNORMAL LOW (ref 24–336)

## 2018-04-07 LAB — AMMONIA: Ammonia: 18 umol/L (ref 9–35)

## 2018-04-07 MED ORDER — ALBUMIN HUMAN 25 % IV SOLN
25.0000 g | Freq: Once | INTRAVENOUS | Status: AC
  Administered 2018-04-07: 25 g via INTRAVENOUS

## 2018-04-07 MED ORDER — ALBUMIN HUMAN 25 % IV SOLN
INTRAVENOUS | Status: AC
  Administered 2018-04-07: 25 g via INTRAVENOUS
  Filled 2018-04-07: qty 100

## 2018-04-07 MED ORDER — ALBUMIN HUMAN 25 % IV SOLN
INTRAVENOUS | Status: AC
  Filled 2018-04-07: qty 100

## 2018-04-07 NOTE — Procedures (Signed)
Pre Procedural Dx: Symptomatic Ascites Post Procedural Dx: Same  Successful US guided paracentesis yielding 7.2 L of serous ascitic fluid. Sample sent to laboratory as requested.  EBL: None  Complications: None immediate  Katherina RightJay Yuliet Needs, MD Pager #: [phone]

## 2018-04-08 LAB — HEPATITIS B CORE ANTIBODY, TOTAL: HEP B C TOTAL AB: NEGATIVE

## 2018-04-08 LAB — HEPATITIS C ANTIBODY (REFLEX): HCV Ab: 0.2 s/co ratio (ref 0.0–0.9)

## 2018-04-08 LAB — HEPATITIS A ANTIBODY, IGM: HEP A IGM: NEGATIVE

## 2018-04-08 LAB — PROTEIN, BODY FLUID (OTHER): Total Protein, Body Fluid Other: 1.2 g/dL

## 2018-04-08 LAB — HEPATITIS B SURFACE ANTIBODY,QUALITATIVE: HEP B S AB: NONREACTIVE

## 2018-04-08 LAB — HEPATITIS B SURFACE ANTIGEN: Hepatitis B Surface Ag: NEGATIVE

## 2018-04-08 LAB — ALBUMIN, FLUID (OTHER): Albumin, Body Fluid Other: 0.4 g/dL

## 2018-04-08 LAB — HEPATITIS A ANTIBODY, TOTAL: Hep A Total Ab: NEGATIVE

## 2018-04-08 LAB — HCV COMMENT:

## 2018-04-08 LAB — HEPATITIS B CORE ANTIBODY, IGM: Hep B C IgM: NEGATIVE

## 2018-04-08 LAB — CERULOPLASMIN: Ceruloplasmin: 32.9 mg/dL — ABNORMAL HIGH (ref 16.0–31.0)

## 2018-04-09 DIAGNOSIS — K7460 Unspecified cirrhosis of liver: Secondary | ICD-10-CM

## 2018-04-09 HISTORY — DX: Unspecified cirrhosis of liver: K74.60

## 2018-04-09 LAB — MITOCHONDRIAL/SMOOTH MUSCLE AB PNL
F-ACTIN AB IGG: 32 U — AB (ref 0–19)
Mitochondrial M2 Ab, IgG: <20 Units (ref 0.0–20.0)

## 2018-04-10 LAB — BODY FLUID CULTURE
Culture: NO GROWTH
Gram Stain: NONE SEEN

## 2018-04-12 ENCOUNTER — Telehealth: Payer: Self-pay | Admitting: Gastroenterology

## 2018-04-12 NOTE — Telephone Encounter (Signed)
Patient daughter calling to get lab results from 8.30.19 and wanting to know if pt needs to come in for follow up since he had paracentesis. Pt daughter requesting cb today to discuss.

## 2018-04-14 NOTE — Telephone Encounter (Signed)
Dr. Maximino Greenland wants to see pt next week in office. appt set for Monday 9/9 at 11:00 am.

## 2018-04-14 NOTE — Telephone Encounter (Signed)
I spoke with daughter and went over labs, she is aware that hgb and hct low. She also scheduled a f/u appt 10/7. I informed her that I would let Dr. Maximino Greenland know and will contact her after she advises what the plan is.

## 2018-04-17 ENCOUNTER — Encounter: Payer: Self-pay | Admitting: Gastroenterology

## 2018-04-17 ENCOUNTER — Ambulatory Visit: Payer: Commercial Managed Care - PPO | Admitting: Gastroenterology

## 2018-04-17 VITALS — BP 142/77 | HR 92 | Ht 69.0 in | Wt 266.8 lb

## 2018-04-17 DIAGNOSIS — D649 Anemia, unspecified: Secondary | ICD-10-CM | POA: Diagnosis not present

## 2018-04-17 DIAGNOSIS — K7031 Alcoholic cirrhosis of liver with ascites: Secondary | ICD-10-CM

## 2018-04-17 MED ORDER — ALBUMIN HUMAN 25 % IV SOLN: 50.0000 g | Freq: Once | INTRAVENOUS | 0 refills | Status: AC

## 2018-04-17 MED ORDER — FUROSEMIDE 20 MG PO TABS: 20.0000 mg | ORAL_TABLET | Freq: Every day | ORAL | 3 refills | Status: DC

## 2018-04-17 MED ORDER — SPIRONOLACTONE 50 MG PO TABS: 50.0000 mg | ORAL_TABLET | Freq: Every day | ORAL | 3 refills | Status: DC

## 2018-04-17 MED ORDER — FERROUS SULFATE 325 (65 FE) MG PO TBEC: 325.0000 mg | DELAYED_RELEASE_TABLET | Freq: Two times a day (BID) | ORAL | 3 refills | Status: DC

## 2018-04-17 NOTE — Progress Notes (Signed)
[name], MD [address]  [address]  [place], [place] [address]  Main: [phone]  Fax: [phone]   Primary Care Physician: Erasmo Downer, MD  Primary Gastroenterologist:  Dr. [name]  Chief Complaint  Patient presents with  . Follow-up    talk with Dr. Maximino Greenland regarding low hgb/hct, pt fatigued, what is the plan    HPI: Edwin West is a 57 y.o. male here for follow-up of ascites.  Last paracentesis was on April 07, 2018, 7.2 fluid was removed.  No signs of SBP on fluid cell count.  Fluid studies consistent with portal hypertension.  Cytology on the fluid was ordered but was not performed by lab.  Patient denies any fever or chills.  Does report abdominal distention for the last few days.  No abdominal pain.  Reports lower extremity edema.  No signs of confusion or bleeding.  Has established primary care appointment as we discussed last time.  Current Outpatient Medications  Medication Sig Dispense Refill  . ferrous sulfate 325 (65 FE) MG EC tablet Take 1 tablet (325 mg total) by mouth 2 (two) times daily before a meal. 60 tablet 3  . furosemide (LASIX) 20 MG tablet Take 1 tablet (20 mg total) by mouth daily. 30 tablet 3  . spironolactone (ALDACTONE) 50 MG tablet Take 1 tablet (50 mg total) by mouth daily. 30 tablet 3   No current facility-administered medications for this visit.     Allergies as of 04/17/2018  . (No Known Allergies)    ROS:  General: Negative for anorexia, weight loss, fever, chills, reports fatigue. ENT: Negative for hoarseness, difficulty swallowing , nasal congestion. CV: Negative for chest pain, angina, palpitations, dyspnea on exertion, peripheral edema.  Respiratory: Negative for dyspnea at rest, dyspnea on exertion, cough, sputum, wheezing.  GI: See history of present illness. GU:  Negative for dysuria, hematuria, urinary incontinence, urinary frequency, nocturnal urination.  Endo: Negative for unusual  weight change.    Physical Examination:   BP (!) 142/77   Pulse 92   Ht 5\' 9"  (1.753 m)   Wt 266 lb 12.8 oz (121 kg)   BMI 39.40 kg/m   General: Well-nourished, well-developed in no acute distress.  Eyes: No icterus. Conjunctivae pink. Mouth: Oropharyngeal mucosa moist and pink , no lesions erythema or exudate. Neck: Supple, Trachea midline Abdomen: Bowel sounds are normal, nontender, distended, no hepatosplenomegaly or masses, no abdominal bruits or hernia , no rebound or guarding.   Extremities: 2+ lower extremity edema. No clubbing or deformities. Neuro: Alert and oriented x 3.  Grossly intact.  No asterixis Skin: Warm and dry, no jaundice.   Psych: Alert and cooperative, normal mood and affect.   Labs: CMP     Component Value Date/Time   NA 135 04/07/2018 1320   K 3.4 (L) 04/07/2018 1320   CL 106 04/07/2018 1320   CO2 24 04/07/2018 1320   GLUCOSE 91 04/07/2018 1320   BUN 12 04/07/2018 1320   CREATININE 0.71 04/07/2018 1320   CALCIUM 8.0 (L) 04/07/2018 1320   PROT 6.9 04/07/2018 1320   ALBUMIN 3.2 (L) 04/07/2018 1320   AST 53 (H) 04/07/2018 1320   ALT 27 04/07/2018 1320   ALKPHOS 473 (H) 04/07/2018 1320   BILITOT 1.3 (H) 04/07/2018 1320   GFRNONAA >60 04/07/2018 1320   GFRAA >60 04/07/2018 1320   Lab Results  Component Value Date   WBC 5.1 04/07/2018   HGB 9.5 (L) 04/07/2018   HCT 27.6 (L)  04/07/2018   MCV 102.7 (H) 04/07/2018   PLT 179 04/07/2018    Imaging Studies: Dg Chest 2 View  Result Date: 04/03/2018 CLINICAL DATA:  Shortness of breath and bilateral foot swelling for 1 week. EXAM: CHEST - 2 VIEW COMPARISON:  PA and lateral chest 05/10/2017. FINDINGS: The chest is hyperexpanded with attenuation of the pulmonary vasculature consistent with emphysema. Heart size is upper normal. No pneumothorax. Trace left pleural effusion is noted. There is mild interstitial edema. No acute or focal bony abnormality. IMPRESSION: Mild interstitial edema with a trace left  pleural effusion. Emphysema. Electronically Signed   By: Drusilla Kanner M.D.   On: 04/03/2018 12:26   US Paracentesis  Result Date: 04/07/2018 INDICATION: Recent diagnosis of cirrhosis, now with symptomatic ascites. Please perform ultrasound-guided paracentesis for diagnostic and therapeutic purposes. EXAM: ULTRASOUND-GUIDED PARACENTESIS COMPARISON:  None. MEDICATIONS: None. COMPLICATIONS: None immediate. TECHNIQUE: Informed written consent was obtained from the patient after a discussion of the risks, benefits and alternatives to treatment. A timeout was performed prior to the initiation of the procedure. Initial ultrasound scanning demonstrates a large amount of ascites within the right lower abdominal quadrant. The right lower abdomen was prepped and draped in the usual sterile fashion. 1% lidocaine with epinephrine was used for local anesthesia. An ultrasound image was saved for documentation purposed. An 8 Fr Safe-T-Centesis catheter was introduced. The paracentesis was performed. The catheter was removed and a dressing was applied. The patient tolerated the procedure well without immediate post procedural complication. FINDINGS: A total of approximately 7.2 liters of serous fluid was removed. Samples were sent to the laboratory as requested by the clinical team. IMPRESSION: Successful ultrasound-guided paracentesis yielding 7.2 liters of peritoneal fluid. Electronically Signed   By: Simonne Come M.D.   On: 04/07/2018 12:48   US Abdomen Limited Ruq  Result Date: 04/03/2018 CLINICAL DATA:  57 year old male with abdominal distention for 1 week. EXAM: ULTRASOUND ABDOMEN LIMITED RIGHT UPPER QUADRANT COMPARISON:  None. FINDINGS: Gallbladder: No echogenic sludge or stones identified within the gallbladder lumen. Mild gallbladder wall thickening measuring 4-5 millimeters. No sonographic Murphy sign elicited. Common bile duct: Diameter: 6-7 millimeters, upper limits of normal. Liver: Nodular and cirrhotic  liver with perihepatic free fluid. No discrete liver lesion. No intrahepatic biliary ductal dilatation. Portal vein is patent on color Doppler imaging with normal direction of blood flow towards the liver. Other findings: Ascites fluid visible in all 4 quadrants. Negative visible right kidney. IMPRESSION: 1. Cirrhotic Liver with ascites. 2. Mild gallbladder wall thickening, likely reactive due to #1. Electronically Signed   By: Odessa Fleming M.D.   On: 04/03/2018 15:07    Assessment and Plan:   Edwin West is a 57 y.o. y/o male here for follow-up of cirrhosis and ascites  Meld 7 Patient has been compliant with no alcohol use for about a month at this time He will need repeat paracentesis given abdominal distention We will try to schedule as an outpatient We will start on Lasix 20 mg and spironolactone 50 mg Repeat BMP in 1 week and if kidney function remains normal, can increase dose at that time  Sodium restriction to less than 2000 mg a day discussed again Patient was asked to try to move his primary care provider to a sooner date since he has not seen a primary care provider in years Patient will need regular labs by his primary care provider, and possibly an echo as well  Last lab work on August 30 shows low ferritin at  13 We will start oral iron replacement We will also check B12 and folate as MCV is elevated and anemia is possibly both due to iron deficiency and possible B12 or folate deficiency given his chronic alcohol use Importance of maintaining good nutrition encouraged as well  Patient will need an EGD and colonoscopy, for variceal screening, colorectal cancer screening, iron deficiency anemia  However, given his repeated abdominal distention, requiring large volume paracentesis, need for new diuretics, will further medically optimize and schedule for procedures after his paracentesis and after he has had some time on diuretics.  Importance of alcohol abstinence discussed in  detail again and he verbalized understanding No signs of GI bleeding to indicate urgent endoscopy at this time  The importance of follow-up with primary care provider discussed in detail and him and his daughter will try to make an appointment sooner if possible with their primary care provider.  No signs of confusion  Last labs showed potassium of 3.4 Patient asked to eat potassium rich foods such as banana.  In addition, the spironolactone will counteract Lasix as far as avoiding hypokalemia.  In addition patient was only taking Lasix at the time of the potassium draw.  Patient is not immune to hepatitis B.  Will check hepatitis A immunity and if not immune, patient can get vaccination for both.   Dr [name]

## 2018-04-18 ENCOUNTER — Other Ambulatory Visit: Payer: Self-pay

## 2018-04-18 ENCOUNTER — Telehealth: Payer: Self-pay

## 2018-04-18 DIAGNOSIS — K7031 Alcoholic cirrhosis of liver with ascites: Secondary | ICD-10-CM

## 2018-04-18 MED ORDER — ALBUMIN HUMAN 25 % IV SOLN: 25.0000 g | Freq: Once | INTRAVENOUS | 0 refills | Status: AC

## 2018-04-18 NOTE — Telephone Encounter (Signed)
I left both [name] and [name] a message letting them know that they were not forgotten that there was a change in the order (albumin) and this has now been changed back. (technical difficulty) and they should hear from [place] tomorrow with his appt time. The pt., Edwin West called right back and was told over the phone. He did state he was starting to hurt a little under his ribs. An appt will be scheduled asap and we will let him know.

## 2018-04-19 ENCOUNTER — Telehealth: Payer: Self-pay | Admitting: Gastroenterology

## 2018-04-19 NOTE — Telephone Encounter (Signed)
Barbara from scheduling is calling for Edwin West pt is scheduled for Parathentisis for Sep 13th needs to be there at 8:00 [facility]

## 2018-04-19 NOTE — Telephone Encounter (Signed)
Left message that paracentesis scheduled with date, place and time. If any questions to contact the office.

## 2018-04-19 NOTE — Telephone Encounter (Signed)
Pt 's daughter calls and given phone number to reschedule paracentesis since 9/13 is a conflict.

## 2018-04-21 ENCOUNTER — Ambulatory Visit
Admission: RE | Admit: 2018-04-21 | Discharge: 2018-04-21 | Disposition: A | Discharge status: Home / Self Care | Payer: Commercial Managed Care - PPO | Source: Ambulatory Visit | Attending: Gastroenterology | Admitting: Gastroenterology

## 2018-04-21 ENCOUNTER — Telehealth: Payer: Self-pay | Admitting: Gastroenterology

## 2018-04-21 DIAGNOSIS — K7031 Alcoholic cirrhosis of liver with ascites: Secondary | ICD-10-CM | POA: Diagnosis not present

## 2018-04-21 DIAGNOSIS — D649 Anemia, unspecified: Secondary | ICD-10-CM

## 2018-04-21 DIAGNOSIS — R188 Other ascites: Secondary | ICD-10-CM | POA: Diagnosis not present

## 2018-04-21 LAB — BODY FLUID CELL COUNT WITH DIFFERENTIAL
Eos, Fluid: 0 %
Lymphs, Fluid: 52 %
Monocyte-Macrophage-Serous Fluid: 37 %
NEUTROPHIL FLUID: 11 %
Total Nucleated Cell Count, Fluid: 251 cu mm

## 2018-04-21 MED ORDER — ALBUMIN HUMAN 25 % IV SOLN
INTRAVENOUS | Status: AC
  Filled 2018-04-21: qty 100

## 2018-04-21 MED ORDER — ALBUMIN HUMAN 25 % IV SOLN
INTRAVENOUS | Status: AC
  Administered 2018-04-21: 25 g via INTRAVENOUS
  Filled 2018-04-21: qty 100

## 2018-04-21 MED ORDER — ALBUMIN HUMAN 25 % IV SOLN
25.0000 g | Freq: Once | INTRAVENOUS | Status: AC
  Administered 2018-04-21: 25 g via INTRAVENOUS

## 2018-04-21 NOTE — Telephone Encounter (Signed)
Pt daughter is calling regarding pt PCP apt  And Lab work  Apt as well she wants to know if it is ok to come in any day

## 2018-04-21 NOTE — Procedures (Signed)
Ultrasound-guided diagnostic and therapeutic paracentesis performed yielding 7.4 liters of yellow fluid. No immediate complications. A portion of the fluid was sent to the lab for preordered studies. IV albumin will be administered afterwards.

## 2018-04-24 LAB — BODY FLUID CULTURE: Culture: NO GROWTH

## 2018-04-24 LAB — CYTOLOGY - NON PAP

## 2018-04-25 NOTE — Telephone Encounter (Signed)
I spoke with pt regarding work note and to come to office tomorrow for lab draw. He states he is still working on PCP.

## 2018-04-25 NOTE — Addendum Note (Signed)
Addended by: Sheenah Dimitroff W on: 04/25/2018 04:36 PM   Modules accepted: Orders  

## 2018-04-25 NOTE — Addendum Note (Signed)
Addended by: Jackquline DenmarkIDGEWAY, Gayla Benn W on: 04/25/2018 04:36 PM   Modules accepted: Orders

## 2018-04-25 NOTE — Addendum Note (Signed)
Addended by: Yulisa Chirico W on: 04/25/2018 04:36 PM   Modules accepted: Orders  

## 2018-04-25 NOTE — Telephone Encounter (Signed)
Pt daughter is calling to find out if Dr. Maximino Greenlandahiliani can write a note for pt to return to work  She is coming in for Lab work tomorrow and would like to pick this note up then.

## 2018-04-25 NOTE — Addendum Note (Signed)
Addended by: Akeela Busk W on: 04/25/2018 04:36 PM   Modules accepted: Orders  

## 2018-04-26 ENCOUNTER — Telehealth: Payer: Self-pay

## 2018-04-26 DIAGNOSIS — D649 Anemia, unspecified: Secondary | ICD-10-CM | POA: Diagnosis not present

## 2018-04-26 NOTE — Telephone Encounter (Signed)
Another work note written due to wrong Monday to return to work.

## 2018-04-27 LAB — COMPREHENSIVE METABOLIC PANEL
A/G RATIO: 0.9 — AB (ref 1.2–2.2)
ALK PHOS: 555 IU/L — AB (ref 39–117)
ALT: 20 IU/L (ref 0–44)
AST: 45 IU/L — ABNORMAL HIGH (ref 0–40)
Albumin: 3.3 g/dL — ABNORMAL LOW (ref 3.5–5.5)
BUN/Creatinine Ratio: 13 (ref 9–20)
BUN: 10 mg/dL (ref 6–24)
Bilirubin Total: 1 mg/dL (ref 0.0–1.2)
CHLORIDE: 101 mmol/L (ref 96–106)
CO2: 21 mmol/L (ref 20–29)
Calcium: 8.7 mg/dL (ref 8.7–10.2)
Creatinine, Ser: 0.79 mg/dL (ref 0.76–1.27)
GFR calc Af Amer: 117 mL/min/{1.73_m2} (ref >59)
GFR calc non Af Amer: 101 mL/min/{1.73_m2} (ref >59)
GLOBULIN, TOTAL: 3.8 g/dL (ref 1.5–4.5)
Glucose: 94 mg/dL (ref 65–99)
POTASSIUM: 5.5 mmol/L — AB (ref 3.5–5.2)
SODIUM: 135 mmol/L (ref 134–144)
Total Protein: 7.1 g/dL (ref 6.0–8.5)

## 2018-04-27 LAB — IRON AND TIBC
IRON SATURATION: 16 % (ref 15–55)
Iron: 52 ug/dL (ref 38–169)
TIBC: 328 ug/dL (ref 250–450)
UIBC: 276 ug/dL (ref 111–343)

## 2018-04-27 LAB — HEPATITIS A ANTIBODY, TOTAL: HEP A TOTAL AB: NEGATIVE

## 2018-04-27 LAB — B12 AND FOLATE PANEL
Folate: 6.8 ng/mL (ref >3.0)
Vitamin B-12: 666 pg/mL (ref 232–1245)

## 2018-05-15 ENCOUNTER — Encounter

## 2018-05-15 ENCOUNTER — Encounter: Payer: Self-pay | Admitting: Gastroenterology

## 2018-05-15 ENCOUNTER — Telehealth: Payer: Self-pay | Admitting: Gastroenterology

## 2018-05-15 ENCOUNTER — Ambulatory Visit: Payer: Commercial Managed Care - PPO | Admitting: Gastroenterology

## 2018-05-15 VITALS — BP 92/57 | HR 79 | Ht 69.0 in | Wt 222.2 lb

## 2018-05-15 DIAGNOSIS — Z1211 Encounter for screening for malignant neoplasm of colon: Secondary | ICD-10-CM | POA: Diagnosis not present

## 2018-05-15 DIAGNOSIS — Z1212 Encounter for screening for malignant neoplasm of rectum: Secondary | ICD-10-CM | POA: Diagnosis not present

## 2018-05-15 DIAGNOSIS — Z23 Encounter for immunization: Secondary | ICD-10-CM

## 2018-05-15 DIAGNOSIS — Z13810 Encounter for screening for upper gastrointestinal disorder: Secondary | ICD-10-CM | POA: Diagnosis not present

## 2018-05-15 MED ORDER — FUROSEMIDE 40 MG PO TABS: 40.0000 mg | ORAL_TABLET | Freq: Every day | ORAL | 2 refills | Status: DC

## 2018-05-15 MED ORDER — SPIRONOLACTONE 100 MG PO TABS: 100.0000 mg | ORAL_TABLET | Freq: Every day | ORAL | 2 refills | Status: DC

## 2018-05-15 NOTE — Telephone Encounter (Signed)
Patient called again & is very anxious about receiving his lab work ordered by Dr Maximino Greenland

## 2018-05-17 ENCOUNTER — Ambulatory Visit: Payer: Commercial Managed Care - PPO | Admitting: Family Medicine

## 2018-05-17 ENCOUNTER — Encounter: Payer: Self-pay | Admitting: Family Medicine

## 2018-05-17 VITALS — BP 100/52 | HR 89 | Ht 67.5 in | Wt 218.0 lb

## 2018-05-17 DIAGNOSIS — K7031 Alcoholic cirrhosis of liver with ascites: Secondary | ICD-10-CM | POA: Diagnosis not present

## 2018-05-17 DIAGNOSIS — Z7689 Persons encountering health services in other specified circumstances: Secondary | ICD-10-CM | POA: Diagnosis not present

## 2018-05-17 DIAGNOSIS — M954 Acquired deformity of chest and rib: Secondary | ICD-10-CM

## 2018-05-17 DIAGNOSIS — E875 Hyperkalemia: Secondary | ICD-10-CM

## 2018-05-17 DIAGNOSIS — D649 Anemia, unspecified: Secondary | ICD-10-CM

## 2018-05-17 DIAGNOSIS — Z720 Tobacco use: Secondary | ICD-10-CM

## 2018-05-17 NOTE — Patient Instructions (Addendum)
Please come back in 3 months for your complete physical  Consider stopping smoking  Steps to Quit Smoking Smoking tobacco can be bad for your health. It can also affect almost every organ in your body. Smoking puts you and people around you at risk for many serious long-lasting (chronic) diseases. Quitting smoking is hard, but it is one of the best things that you can do for your health. It is never too late to quit. What are the benefits of quitting smoking? When you quit smoking, you lower your risk for getting serious diseases and conditions. They can include:  Lung cancer or lung disease.  Heart disease.  Stroke.  Heart attack.  Not being able to have children (infertility).  Weak bones (osteoporosis) and broken bones (fractures).  If you have coughing, wheezing, and shortness of breath, those symptoms may get better when you quit. You may also get sick less often. If you are pregnant, quitting smoking can help to lower your chances of having a baby of low birth weight. What can I do to help me quit smoking? Talk with your doctor about what can help you quit smoking. Some things you can do (strategies) include:  Quitting smoking totally, instead of slowly cutting back how much you smoke over a period of time.  Going to in-person counseling. You are more likely to quit if you go to many counseling sessions.  Using resources and support systems, such as: ? Agricultural engineer with a Veterinary surgeon. ? Phone quitlines. ? Automotive engineer. ? Support groups or group counseling. ? Text messaging programs. ? Mobile phone apps or applications.  Taking medicines. Some of these medicines may have nicotine in them. If you are pregnant or breastfeeding, do not take any medicines to quit smoking unless your doctor says it is okay. Talk with your doctor about counseling or other things that can help you.  Talk with your doctor about using more than one strategy at the same time, such as  taking medicines while you are also going to in-person counseling. This can help make quitting easier. What things can I do to make it easier to quit? Quitting smoking might feel very hard at first, but there is a lot that you can do to make it easier. Take these steps:  Talk to your family and friends. Ask them to support and encourage you.  Call phone quitlines, reach out to support groups, or work with a Veterinary surgeon.  Ask people who smoke to not smoke around you.  Avoid places that make you want (trigger) to smoke, such as: ? Bars. ? Parties. ? Smoke-break areas at work.  Spend time with people who do not smoke.  Lower the stress in your life. Stress can make you want to smoke. Try these things to help your stress: ? Getting regular exercise. ? Deep-breathing exercises. ? Yoga. ? Meditating. ? Doing a body scan. To do this, close your eyes, focus on one area of your body at a time from head to toe, and notice which parts of your body are tense. Try to relax the muscles in those areas.  Download or buy apps on your mobile phone or tablet that can help you stick to your quit plan. There are many free apps, such as QuitGuide from the [employer] developer for Disease Control and Prevention). You can find more support from smokefree.gov and other websites.  This information is not intended to replace advice given to you by your health care provider. Make sure  you discuss any questions you have with your health care provider. Document Released: 05/22/2009 Document Revised: 03/23/2016 Document Reviewed: 12/10/2014 Elsevier Interactive Patient Education  2018 [employer].

## 2018-05-17 NOTE — Progress Notes (Signed)
Subjective:    Patient ID: Edwin West, male    DOB: 1961-07-25, 57 y.o.   MRN: [id number]  HPI This is a 57 yo male who presents today to establish care. Works as a Academic librarian. Lives with his daughter. Enjoys football.   Last CPE- many years, no recent primary care Colonoscopy- has scheduled for next month with EDG Tdap- unsure, will get at next visit, just had hep A and hep B 2 days ago Flu- will get at work Science writer- over due Eye- over due, unsure Exercise- work active  Cirrhosis- was seen in ED 04/03/18 with severe anasarca. US showed cirrhosis and ascites. He was given diuretics and seen in follow up by GI. He had paracentesis with 7.2 l fluid removed 04/07/18. Seen again by GI 04/17/18, discussed anemia, sodium restriction of 2 gm daily. Repeat paracentesis was performed on 04/21/18 and 7.4 liters of fluid was removed. He was given albumin following procedure.   Alcohol- stopped a little over 2 months ago. Was drinking up to a pint a day for many years.   Fatigued. Has some popping in right side of sternum with big breath, occurred after big cough. Not painful. Smokes less than 1 ppd. Chronic SOB. Loose bowel movement 3-4 a day, occasional blood. No abdominal pain. Some arthritic pain. No headache, visual change or lightheadedness. Denies sleep disturbance, anxiety or depression.   Past Medical History:  Diagnosis Date  . Cirrhosis of liver (HCC) 04/2018   No past surgical history on file. Family History  Problem Relation Age of Onset  . Cancer Sister        cervical  . Heart failure Sister   . Cancer Other 49       breast cancer  . Heart failure Other    Social History   Tobacco Use  . Smoking status: Current Every Day Smoker  . Smokeless tobacco: Former Engineer, water Use Topics  . Alcohol use: Not Currently  . Drug use: Not Currently    Types: Marijuana, Oxycodone     Review of Systems Per HPI    Objective:   Physical Exam  Constitutional: He is  oriented to person, place, and time. He appears well-developed and well-nourished. No distress.  HENT:  Head: Normocephalic and atraumatic.  Neck: Normal range of motion. Neck supple. No thyromegaly present.  Cardiovascular: Normal rate, regular rhythm, normal heart sounds and intact distal pulses.  Pulmonary/Chest: Effort normal and breath sounds normal.    Abdominal: Bowel sounds are normal. He exhibits distension. There is no tenderness. There is no rebound and no guarding.  Musculoskeletal: Normal range of motion. He exhibits edema (1+ pedal).  Lymphadenopathy:    He has no cervical adenopathy.  Neurological: He is alert and oriented to person, place, and time.  Skin: Skin is warm and dry. He is not diaphoretic.  Psychiatric: He has a normal mood and affect. His behavior is normal. Judgment and thought content normal.  Vitals reviewed.     BP (!) 100/52 (BP Location: Right Arm, Patient Position: Sitting, Cuff Size: Normal)   Pulse 89   Ht 5' 7.5" (1.715 m)   Wt 218 lb (98.9 kg)   SpO2 96%   BMI 33.64 kg/m  Wt Readings from Last 3 Encounters:  05/17/18 218 lb (98.9 kg)  05/15/18 222 lb 3.2 oz (100.8 kg)  04/17/18 266 lb 12.8 oz (121 kg)   Depression screen PHQ 2/9 05/17/2018  Decreased Interest 0  Down, Depressed, Hopeless 0  PHQ - 2 Score 0       Assessment & Plan:  1. Encounter to establish care - reviewed available records, patient without recent primary care, will have him follow up in 3 months for CPE and will get him caught up on overdue health maintenance - getting flu vaccine tomorrow, will have him come in for pneumococcal 23 in 2 weeks, he is getting hep A/B series from GI  2. Alcoholic cirrhosis of liver with ascites (HCC) - discussed his sobriety, he denies having cravings, has good family support  3. Hyperkalemia - Basic Metabolic Panel  4. Anemia, unspecified type - CBC with Differential  5. Chest wall deformity - no pain or instability or visible  deformity, small click felt with deep breath - RTC precautions reviewed  6. Tobacco abuse - encouraged cessation and provided tips for success.    Olean Ree, FNP-BC  [facility], [facility]  05/21/2018 8:41 PM

## 2018-05-17 NOTE — Progress Notes (Signed)
[name], MD [address]  [address]  [place], [place] [address]  Main: [phone]  Fax: [phone]   Primary Care Physician: Emi Belfast, FNP  Primary Gastroenterologist:  Dr. [name]  Chief Complaint  Patient presents with  . Follow-up    cirrhosis liver, hx ascites    HPI: Negan Grudzien is a 57 y.o. male who is overall doing well on diuretics.  States abdominal distention is much less than he used to have, but still has some distention.  No episodes of bleeding.  No episodes of confusion.  No diarrhea.  No abdominal pain.  Reports lower extremity edema is less as well.  Current Outpatient Medications  Medication Sig Dispense Refill  . ferrous sulfate 325 (65 FE) MG EC tablet Take 1 tablet (325 mg total) by mouth 2 (two) times daily before a meal. 60 tablet 3  . furosemide (LASIX) 40 MG tablet Take 1 tablet (40 mg total) by mouth daily. 30 tablet 2  . spironolactone (ALDACTONE) 100 MG tablet Take 1 tablet (100 mg total) by mouth daily. 30 tablet 2   No current facility-administered medications for this visit.     Allergies as of 05/15/2018  . (No Known Allergies)    ROS:  General: Negative for anorexia, weight loss, fever, chills, fatigue, weakness. ENT: Negative for hoarseness, difficulty swallowing , nasal congestion. CV: Negative for chest pain, angina, palpitations, dyspnea on exertion, peripheral edema.  Respiratory: Negative for dyspnea at rest, dyspnea on exertion, cough, sputum, wheezing.  GI: See history of present illness. GU:  Negative for dysuria, hematuria, urinary incontinence, urinary frequency, nocturnal urination.  Endo: Negative for unusual weight change.    Physical Examination:   BP (!) 92/57   Pulse 79   Ht 5\' 9"  (1.753 m)   Wt 222 lb 3.2 oz (100.8 kg)   BMI 32.81 kg/m   General: Well-nourished, well-developed in no acute distress.  Eyes: No icterus. Conjunctivae pink. Mouth: Oropharyngeal mucosa moist  and pink , no lesions erythema or exudate. Neck: Supple, Trachea midline Abdomen: Bowel sounds are normal, nontender, distended, no hepatosplenomegaly or masses, no abdominal bruits or hernia , no rebound or guarding.   Extremities: No lower extremity edema. No clubbing or deformities. Neuro: Alert and oriented x 3.  Grossly intact. Skin: Warm and dry, no jaundice.   Psych: Alert and cooperative, normal mood and affect.   Labs: CMP     Component Value Date/Time   NA 135 04/26/2018 1332   K 5.5 (H) 04/26/2018 1332   CL 101 04/26/2018 1332   CO2 21 04/26/2018 1332   GLUCOSE 94 04/26/2018 1332   GLUCOSE 91 04/07/2018 1320   BUN 10 04/26/2018 1332   CREATININE 0.79 04/26/2018 1332   CALCIUM 8.7 04/26/2018 1332   PROT 7.1 04/26/2018 1332   ALBUMIN 3.3 (L) 04/26/2018 1332   AST 45 (H) 04/26/2018 1332   ALT 20 04/26/2018 1332   ALKPHOS 555 (H) 04/26/2018 1332   BILITOT 1.0 04/26/2018 1332   GFRNONAA 101 04/26/2018 1332   GFRAA 117 04/26/2018 1332   Lab Results  Component Value Date   WBC 5.1 04/07/2018   HGB 9.5 (L) 04/07/2018   HCT 27.6 (L) 04/07/2018   MCV 102.7 (H) 04/07/2018   PLT 179 04/07/2018    Imaging Studies: US Paracentesis  Result Date: 04/21/2018 INDICATION: Patient with history of alcoholic cirrhosis, recurrent ascites. Request made for diagnostic and therapeutic paracentesis. EXAM: ULTRASOUND GUIDED DIAGNOSTIC AND THERAPEUTIC PARACENTESIS MEDICATIONS: None  COMPLICATIONS: None immediate. PROCEDURE: Informed written consent was obtained from the patient after a discussion of the risks, benefits and alternatives to treatment. A timeout was performed prior to the initiation of the procedure. Initial ultrasound scanning demonstrates a large amount of ascites within the right lower abdominal quadrant. The right lower abdomen was prepped and draped in the usual sterile fashion. 1% lidocaine was used for local anesthesia. Following this, a 6 Fr Safe-T-Centesis catheter was  introduced. An ultrasound image was saved for documentation purposes. The paracentesis was performed. The catheter was removed and a dressing was applied. The patient tolerated the procedure well without immediate post procedural complication. FINDINGS: A total of approximately 7.4 liters of yellow fluid was removed. Samples were sent to the laboratory as requested by the clinical team. IMPRESSION: Successful ultrasound-guided diagnostic and therapeutic paracentesis yielding 7.4 liters of peritoneal fluid. IV albumin will be administered afterwards. Read by: Jeananne Rama, PA-C Electronically Signed   By: Irish Lack M.D.   On: 04/21/2018 10:44    Assessment and Plan:   Camrin Gearheart is a 57 y.o. y/o male here for follow-up of alcoholic cirrhosis  Remains abstinent Increase Lasix to 40 mg and spironolactone to 100 mg daily Last paracentesis was on September 13  Patient due for variceal screening and CRC screening, will schedule at this time Repeat blood work ordered today  Continue abstinence  Not immune to hepatitis A or B Vaccination given today  Patient has an appointment with his primary care provider tomorrow and have asked him to make sure he is up-to-date on all of his other immunizations including flu shot, pneumonia vaccine etc. and he verbalized understanding.  Dr [name]

## 2018-05-18 ENCOUNTER — Other Ambulatory Visit: Payer: Self-pay | Admitting: Family Medicine

## 2018-05-18 DIAGNOSIS — K7031 Alcoholic cirrhosis of liver with ascites: Secondary | ICD-10-CM

## 2018-05-18 DIAGNOSIS — Z23 Encounter for immunization: Secondary | ICD-10-CM

## 2018-05-18 LAB — BASIC METABOLIC PANEL WITH GFR
BUN: 15 mg/dL (ref 6–23)
CO2: 26 meq/L (ref 19–32)
Calcium: 9.3 mg/dL (ref 8.4–10.5)
Chloride: 103 meq/L (ref 96–112)
Creatinine, Ser: 0.77 mg/dL (ref 0.40–1.50)
GFR: 111.36 mL/min
Glucose, Bld: 95 mg/dL (ref 70–99)
Potassium: 4.5 meq/L (ref 3.5–5.1)
Sodium: 135 meq/L (ref 135–145)

## 2018-05-18 LAB — CBC WITH DIFFERENTIAL/PLATELET
BASOS ABS: 0 10*3/uL (ref 0.0–0.1)
BASOS PCT: 0.4 % (ref 0.0–3.0)
EOS ABS: 0.2 10*3/uL (ref 0.0–0.7)
Eosinophils Relative: 3.3 % (ref 0.0–5.0)
HEMATOCRIT: 34.6 % — AB (ref 39.0–52.0)
Hemoglobin: 11.4 g/dL — ABNORMAL LOW (ref 13.0–17.0)
LYMPHS PCT: 29.1 % (ref 12.0–46.0)
Lymphs Abs: 1.6 10*3/uL (ref 0.7–4.0)
MCHC: 33.1 g/dL (ref 30.0–36.0)
MCV: 97.9 fl (ref 78.0–100.0)
MONO ABS: 0.7 10*3/uL (ref 0.1–1.0)
Monocytes Relative: 13.6 % — ABNORMAL HIGH (ref 3.0–12.0)
NEUTROS ABS: 2.9 10*3/uL (ref 1.4–7.7)
Neutrophils Relative %: 53.6 % (ref 43.0–77.0)
PLATELETS: 163 10*3/uL (ref 150.0–400.0)
RBC: 3.53 Mil/uL — ABNORMAL LOW (ref 4.22–5.81)
RDW: 18.9 % — AB (ref 11.5–15.5)
WBC: 5.4 10*3/uL (ref 4.0–10.5)

## 2018-05-19 ENCOUNTER — Telehealth: Payer: Self-pay | Admitting: Family Medicine

## 2018-05-19 NOTE — Telephone Encounter (Signed)
Copied from CRM [id number]. Topic: Quick Communication - Lab Results (Clinic Use ONLY) >> May 19, 2018  2:13 PM Lutricia Horsfall, [place] wrote: Called patient to inform them of  lab results. When patient returns call, triage nurse may disclose results.

## 2018-05-21 ENCOUNTER — Encounter: Payer: Self-pay | Admitting: Family Medicine

## 2018-05-21 DIAGNOSIS — K7031 Alcoholic cirrhosis of liver with ascites: Secondary | ICD-10-CM | POA: Insufficient documentation

## 2018-05-21 DIAGNOSIS — D649 Anemia, unspecified: Secondary | ICD-10-CM | POA: Insufficient documentation

## 2018-06-15 ENCOUNTER — Encounter: Payer: Self-pay | Admitting: Student

## 2018-06-16 ENCOUNTER — Ambulatory Visit
Admission: RE | Admit: 2018-06-16 | Discharge: 2018-06-16 | Disposition: A | Discharge status: Home / Self Care | Payer: Commercial Managed Care - PPO | Source: Ambulatory Visit | Attending: Gastroenterology | Admitting: Gastroenterology

## 2018-06-16 ENCOUNTER — Encounter
Admission: RE | Disposition: A | Discharge status: Home / Self Care | Payer: Self-pay | Source: Ambulatory Visit | Attending: Gastroenterology

## 2018-06-16 ENCOUNTER — Ambulatory Visit: Payer: Commercial Managed Care - PPO | Admitting: Anesthesiology

## 2018-06-16 ENCOUNTER — Encounter: Payer: Self-pay | Admitting: *Deleted

## 2018-06-16 DIAGNOSIS — K3189 Other diseases of stomach and duodenum: Secondary | ICD-10-CM

## 2018-06-16 DIAGNOSIS — K766 Portal hypertension: Secondary | ICD-10-CM | POA: Diagnosis not present

## 2018-06-16 DIAGNOSIS — K7031 Alcoholic cirrhosis of liver with ascites: Secondary | ICD-10-CM | POA: Diagnosis not present

## 2018-06-16 DIAGNOSIS — Z79899 Other long term (current) drug therapy: Secondary | ICD-10-CM | POA: Diagnosis not present

## 2018-06-16 DIAGNOSIS — Z13810 Encounter for screening for upper gastrointestinal disorder: Secondary | ICD-10-CM | POA: Diagnosis not present

## 2018-06-16 DIAGNOSIS — K7460 Unspecified cirrhosis of liver: Secondary | ICD-10-CM | POA: Diagnosis not present

## 2018-06-16 DIAGNOSIS — K7030 Alcoholic cirrhosis of liver without ascites: Secondary | ICD-10-CM | POA: Diagnosis not present

## 2018-06-16 DIAGNOSIS — Z13818 Encounter for screening for other digestive system disorders: Secondary | ICD-10-CM | POA: Diagnosis not present

## 2018-06-16 DIAGNOSIS — F17210 Nicotine dependence, cigarettes, uncomplicated: Secondary | ICD-10-CM | POA: Insufficient documentation

## 2018-06-16 DIAGNOSIS — Z1212 Encounter for screening for malignant neoplasm of rectum: Secondary | ICD-10-CM | POA: Diagnosis not present

## 2018-06-16 DIAGNOSIS — Z1211 Encounter for screening for malignant neoplasm of colon: Secondary | ICD-10-CM | POA: Diagnosis not present

## 2018-06-16 DIAGNOSIS — K621 Rectal polyp: Secondary | ICD-10-CM | POA: Diagnosis not present

## 2018-06-16 DIAGNOSIS — K635 Polyp of colon: Secondary | ICD-10-CM | POA: Diagnosis not present

## 2018-06-16 DIAGNOSIS — Z452 Encounter for adjustment and management of vascular access device: Secondary | ICD-10-CM

## 2018-06-16 DIAGNOSIS — I8510 Secondary esophageal varices without bleeding: Secondary | ICD-10-CM

## 2018-06-16 HISTORY — PX: ESOPHAGOGASTRODUODENOSCOPY (EGD) WITH PROPOFOL: SHX5813

## 2018-06-16 HISTORY — PX: COLONOSCOPY WITH PROPOFOL: SHX5780

## 2018-06-16 LAB — POCT I-STAT 4, (NA,K, GLUC, HGB,HCT)
Glucose, Bld: 91 mg/dL (ref 70–99)
HEMATOCRIT: 28 % — AB (ref 39.0–52.0)
HEMOGLOBIN: 9.5 g/dL — AB (ref 13.0–17.0)
Potassium: 3.9 mmol/L (ref 3.5–5.1)
SODIUM: 138 mmol/L (ref 135–145)

## 2018-06-16 SURGERY — COLONOSCOPY WITH PROPOFOL
Anesthesia: General

## 2018-06-16 MED ORDER — PROPOFOL 500 MG/50ML IV EMUL
INTRAVENOUS | Status: DC | PRN
  Administered 2018-06-16: 160 ug/kg/min via INTRAVENOUS

## 2018-06-16 MED ORDER — LIDOCAINE HCL (CARDIAC) PF 100 MG/5ML IV SOSY
PREFILLED_SYRINGE | INTRAVENOUS | Status: DC | PRN
  Administered 2018-06-16: 100 mg via INTRAVENOUS

## 2018-06-16 MED ORDER — PROPOFOL 500 MG/50ML IV EMUL
INTRAVENOUS | Status: AC
  Filled 2018-06-16: qty 50

## 2018-06-16 MED ORDER — IPRATROPIUM-ALBUTEROL 0.5-2.5 (3) MG/3ML IN SOLN: 3.0000 mL | Freq: Once | RESPIRATORY_TRACT | Status: DC

## 2018-06-16 MED ORDER — NADOLOL 20 MG PO TABS: 20.0000 mg | ORAL_TABLET | Freq: Every day | ORAL | 1 refills | Status: DC

## 2018-06-16 MED ORDER — PROPOFOL 10 MG/ML IV BOLUS
INTRAVENOUS | Status: DC | PRN
  Administered 2018-06-16 (×3): 50 mg via INTRAVENOUS
  Administered 2018-06-16: 100 mg via INTRAVENOUS

## 2018-06-16 MED ORDER — GLYCOPYRROLATE PF 0.2 MG/ML IJ SOSY
PREFILLED_SYRINGE | INTRAMUSCULAR | Status: DC | PRN
  Administered 2018-06-16: .2 mg via INTRAVENOUS

## 2018-06-16 MED ORDER — SODIUM CHLORIDE 0.9 % IV SOLN
INTRAVENOUS | Status: DC
  Administered 2018-06-16: 1000 mL via INTRAVENOUS

## 2018-06-16 MED ORDER — GLYCOPYRROLATE 0.2 MG/ML IJ SOLN
INTRAMUSCULAR | Status: AC
  Filled 2018-06-16: qty 1

## 2018-06-16 MED ORDER — LIDOCAINE HCL (PF) 2 % IJ SOLN
INTRAMUSCULAR | Status: AC
  Filled 2018-06-16: qty 10

## 2018-06-16 MED ORDER — PROPOFOL 10 MG/ML IV BOLUS
INTRAVENOUS | Status: AC
  Filled 2018-06-16: qty 20

## 2018-06-16 NOTE — Op Note (Signed)
[facility] Patient Name: Edwin West Procedure Date: 06/16/2018 8:57 AM MRN: [id number] Account #: [id number] Date of Birth: [birth year] Admit Type: Outpatient Age: 57 Room: [facility] ENDO ROOM 2 Gender: Male Note Status: Finalized Procedure:            Colonoscopy Indications:          Screening for colorectal malignant neoplasm Providers:            Alixandria Friedt B. Maximino Greenland MD, MD Referring MD:         Emi Belfast (Referring MD) Medicines:            Monitored Anesthesia Care Complications:        No immediate complications. Procedure:            Pre-Anesthesia Assessment:                       - ASA Grade Assessment: III - A patient with severe                        systemic disease.                       - Prior to the procedure, a History and Physical was                        performed, and patient medications, allergies and                        sensitivities were reviewed. The patient's tolerance of                        previous anesthesia was reviewed.                       - The risks and benefits of the procedure and the                        sedation options and risks were discussed with the                        patient. All questions were answered and informed                        consent was obtained.                       - Patient identification and proposed procedure were                        verified prior to the procedure by the physician, the                        nurse, the anesthesiologist, the anesthetist and the                        technician. The procedure was verified in the procedure                        room.  After obtaining informed consent, the colonoscope was                        passed under direct vision. Throughout the procedure,                        the patient's blood pressure, pulse, and oxygen                        saturations were monitored continuously. The                     Colonoscope was introduced through the anus and                        advanced to the the cecum, identified by appendiceal                        orifice and ileocecal valve. The colonoscopy was                        performed with ease. The patient tolerated the                        procedure well. The quality of the bowel preparation                        was fair. Findings:      The perianal and digital rectal examinations were normal.      A 5 mm polyp was found in the rectum. The polyp was sessile. The polyp       was removed with a cold snare. Resection and retrieval were complete.      The exam was otherwise without abnormality.      The rectum, sigmoid colon, descending colon, transverse colon, ascending       colon and cecum appeared normal.      The retroflexed view of the distal rectum and anal verge was normal and       showed no anal or rectal abnormalities. Impression:           - Preparation of the colon was fair.                       - One 5 mm polyp in the rectum, removed with a cold                        snare. Resected and retrieved.                       - The examination was otherwise normal.                       - The rectum, sigmoid colon, descending colon,                        transverse colon, ascending colon and cecum are normal.                       - The distal rectum and anal verge are normal on  retroflexion view.                       - Due to the prep, this was not a satisfactory exam for                        colorectal cancer screening (and thus repeat is                        recommended in 1 year), that is evaluation for small or                        flat lesions or polyps. Patient should follow up in                        clinic after discharge to discuss need for future                        colonoscopy and colorectal cancer screening. Recommendation:       - Discharge patient to home (with  escort).                       - Advance diet as tolerated.                       - Continue present medications.                       - Await pathology results.                       - Repeat colonoscopy in 1 year due to fair prep. 2 day                        prep on next exam.                       - The findings and recommendations were discussed with                        the patient.                       - The findings and recommendations were discussed with                        the patient's family.                       - Return to primary care physician as previously                        scheduled. Procedure Code(s):    --- Professional ---                       [phone], Colonoscopy, flexible; with removal of tumor(s),                        polyp(s), or other lesion(s) by snare technique Diagnosis Code(s):    --- Professional ---  Z12.11, Encounter for screening for malignant neoplasm                        of colon                       K62.1, Rectal polyp CPT copyright 2018 American Medical Association. All rights reserved. The codes documented in this report are preliminary and upon coder review may  be revised to meet current compliance requirements.  [name], MD Michel Bickers B. Maximino Greenland MD, MD 06/16/2018 9:57:07 AM This report has been signed electronically. Number of Addenda: 0 Note Initiated On: 06/16/2018 8:57 AM Scope Withdrawal Time: 0 hours 11 minutes 38 seconds  Total Procedure Duration: 0 hours 27 minutes 54 seconds  Estimated Blood Loss: Estimated blood loss: none.      [facility]

## 2018-06-16 NOTE — Anesthesia Postprocedure Evaluation (Signed)
Anesthesia Post Note  Patient: Edwin West  Procedure(s) Performed: COLONOSCOPY WITH PROPOFOL (N/A ) ESOPHAGOGASTRODUODENOSCOPY (EGD) WITH PROPOFOL (N/A )  Patient location during evaluation: Endoscopy Anesthesia Type: General Level of consciousness: awake and alert Pain management: pain level controlled Vital Signs Assessment: post-procedure vital signs reviewed and stable Respiratory status: spontaneous breathing, nonlabored ventilation, respiratory function stable and patient connected to nasal cannula oxygen Cardiovascular status: blood pressure returned to baseline and stable Postop Assessment: no apparent nausea or vomiting Anesthetic complications: no     Last Vitals:  Vitals:   06/16/18 1000 06/16/18 1010  BP: 116/68 110/73  Pulse: 78 78  Resp: 17 20  Temp:    SpO2: 100% 100%    Last Pain:  Vitals:   06/16/18 0945  TempSrc: Tympanic  PainSc:                  Cleda Mccreedy Merrissa Giacobbe

## 2018-06-16 NOTE — Transfer of Care (Signed)
Immediate Anesthesia Transfer of Care Note  Patient: Edwin West  Procedure(s) Performed: COLONOSCOPY WITH PROPOFOL (N/A ) ESOPHAGOGASTRODUODENOSCOPY (EGD) WITH PROPOFOL (N/A )  Patient Location: Endoscopy Unit  Anesthesia Type:General  Level of Consciousness: sedated  Airway & Oxygen Therapy: Patient Spontanous Breathing  Post-op Assessment: Report given to RN and Post -op Vital signs reviewed and stable  Post vital signs: Reviewed and stable  Last Vitals:  Vitals Value Taken Time  BP    Temp    Pulse 87 06/16/2018  9:45 AM  Resp 27 06/16/2018  9:45 AM  SpO2 98 % 06/16/2018  9:45 AM  Vitals shown include unvalidated device data.  Last Pain:  Vitals:   06/16/18 0801  TempSrc: Tympanic  PainSc: 0-No pain         Complications: No apparent anesthesia complications

## 2018-06-16 NOTE — Anesthesia Post-op Follow-up Note (Signed)
Anesthesia QCDR form completed.        

## 2018-06-16 NOTE — Anesthesia Preprocedure Evaluation (Signed)
Anesthesia Evaluation  Patient identified by MRN, date of birth, ID band Patient awake    Reviewed: Allergy & Precautions, H&P , NPO status , Patient's Chart, lab work & pertinent test results  History of Anesthesia Complications Negative for: history of anesthetic complications  Airway Mallampati: III  TM Distance: <3 FB Neck ROM: limited    Dental  (+) Chipped, Poor Dentition, Missing   Pulmonary COPD, Current Smoker,           Cardiovascular Exercise Tolerance: Good (-) angina(-) Past MI and (-) DOE negative cardio ROS       Neuro/Psych negative neurological ROS  negative psych ROS   GI/Hepatic negative GI ROS, neg GERD  ,(+) Cirrhosis       ,   Endo/Other  negative endocrine ROS  Renal/GU negative Renal ROS  negative genitourinary   Musculoskeletal   Abdominal   Peds  Hematology negative hematology ROS (+)   Anesthesia Other Findings Past Medical History: 04/2018: Cirrhosis of liver (HCC)  History reviewed. No pertinent surgical history.  BMI    Body Mass Index:  31.44 kg/m      Reproductive/Obstetrics negative OB ROS                             Anesthesia Physical Anesthesia Plan  ASA: III  Anesthesia Plan: General   Post-op Pain Management:    Induction: Intravenous  PONV Risk Score and Plan: Propofol infusion and TIVA  Airway Management Planned: Natural Airway and Nasal Cannula  Additional Equipment:   Intra-op Plan:   Post-operative Plan:   Informed Consent: I have reviewed the patients History and Physical, chart, labs and discussed the procedure including the risks, benefits and alternatives for the proposed anesthesia with the patient or authorized representative who has indicated his/her understanding and acceptance.   Dental Advisory Given  Plan Discussed with: Anesthesiologist, CRNA and Surgeon  Anesthesia Plan Comments: (Patient consented for  risks of anesthesia including but not limited to:  - adverse reactions to medications - risk of intubation if required - damage to teeth, lips or other oral mucosa - sore throat or hoarseness - Damage to heart, brain, lungs or loss of life  Patient voiced understanding.)        Anesthesia Quick Evaluation

## 2018-06-16 NOTE — H&P (Signed)
[name], MD [address], [address], [place], [place], [address] [address], [place], [place], [address] Phone: [phone]  Fax: [phone]  Primary Care Physician:  Emi Belfast, FNP   Pre-Procedure History & Physical: HPI:  Edwin West is a 57 y.o. male is here for a colonoscopy and EGD.   Past Medical History:  Diagnosis Date  . Cirrhosis of liver (HCC) 04/2018    History reviewed. No pertinent surgical history.  Prior to Admission medications   Medication Sig Start Date End Date Taking? Authorizing Provider  ferrous sulfate 325 (65 FE) MG EC tablet Take 1 tablet (325 mg total) by mouth 2 (two) times daily before a meal. 04/17/18  Yes Tahiliani, Michel Bickers B, MD  furosemide (LASIX) 40 MG tablet Take 1 tablet (40 mg total) by mouth daily. 05/15/18  Yes Pasty Spillers, MD  spironolactone (ALDACTONE) 100 MG tablet Take 1 tablet (100 mg total) by mouth daily. 05/15/18  Yes Pasty Spillers, MD    Allergies as of 05/16/2018  . (No Known Allergies)    Family History  Problem Relation Age of Onset  . Cancer Sister        cervical  . Heart failure Sister   . Cancer Other 49       breast cancer  . Heart failure Other     Social History   Socioeconomic History  . Marital status: Single    Spouse name: Not on file  . Number of children: Not on file  . Years of education: Not on file  . Highest education level: Not on file  Occupational History  . Not on file  Social Needs  . Financial resource strain: Not on file  . Food insecurity:    Worry: Not on file    Inability: Not on file  . Transportation needs:    Medical: Not on file    Non-medical: Not on file  Tobacco Use  . Smoking status: Current Every Day Smoker    Packs/day: 0.50    Types: Cigarettes  . Smokeless tobacco: Former Neurosurgeon    Types: Chew  Substance and Sexual Activity  . Alcohol use: Not Currently  . Drug use: Not Currently    Types: Marijuana,  Oxycodone    Comment: none since 5'  . Sexual activity: Yes    Partners: Female  Lifestyle  . Physical activity:    Days per week: Not on file    Minutes per session: Not on file  . Stress: Not on file  Relationships  . Social connections:    Talks on phone: Not on file    Gets together: Not on file    Attends religious service: Not on file    Active member of club or organization: Not on file    Attends meetings of clubs or organizations: Not on file    Relationship status: Not on file  . Intimate partner violence:    Fear of current or ex partner: Not on file    Emotionally abused: Not on file    Physically abused: Not on file    Forced sexual activity: Not on file  Other Topics Concern  . Not on file  Social History Narrative  . Not on file    Review of Systems: See HPI, otherwise negative ROS  Physical Exam: BP 102/68   Pulse 73   Temp (!) 96.8 F (36 C) (Tympanic)   Resp 18   Ht 5' 9.5" (1.765 m)  Wt 98 kg   SpO2 100%   BMI 31.44 kg/m  General:   Alert,  pleasant and cooperative in NAD Head:  Normocephalic and atraumatic. Neck:  Supple; no masses or thyromegaly. Lungs:  Clear throughout to auscultation, normal respiratory effort.    Heart:  +S1, +S2, Regular rate and rhythm, No edema. Abdomen:  Soft, nontender and nondistended. Normal bowel sounds, without guarding, and without rebound.   Neurologic:  Alert and  oriented x4;  grossly normal neurologically.  Impression/Plan: Edwin West is here for a colonoscopy to be performed for average risk screening and EGD for Variceal Screening   Risks, benefits, limitations, and alternatives regarding the procedures have been reviewed with the patient.  Questions have been answered.  All parties agreeable.   Pasty Spillers, MD  06/16/2018, 8:48 AM

## 2018-06-16 NOTE — Op Note (Signed)
[facility]astroenter[facility] Patient Name: Edwin West Procedure Date: 06/16/2018 8:57 AM MRN: [id number] Account #: [id number] Date of Birth: 1960-11-09 Admit Type: Outpatient Age: 57 Room: [facility] ENDO ROOM 2 Gender: Male Note Status: Finalized Procedure:            Upper GI endoscopy Indications:          Cirrhosis rule out esophageal varices Providers:            Varnita B. Maximino Greenland MD, MD Referring MD:         Emi Belfast (Referring MD) Medicines:            Monitored Anesthesia Care Complications:        No immediate complications. Procedure:            Pre-Anesthesia Assessment:                       - The risks and benefits of the procedure and the                        sedation options and risks were discussed with the                        patient. All questions were answered and informed                        consent was obtained.                       - Patient identification and proposed procedure were                        verified prior to the procedure.                       - ASA Grade Assessment: III - A patient with severe                        systemic disease.                       After obtaining informed consent, the endoscope was                        passed under direct vision. Throughout the procedure,                        the patient's blood pressure, pulse, and oxygen                        saturations were monitored continuously. The Endoscope                        was introduced through the mouth, and advanced to the                        second part of duodenum. The upper GI endoscopy was                        accomplished with ease. The patient tolerated the  procedure well. Findings:      Three columns of non-bleeding grade II varices were found in the distal       esophagus,. No stigmata of recent bleeding were evident and no red wale       signs were present. Variceal screening was done  for primary prophylaxis       and patient has no previous history of GI or variceal bleeding. Patient       was coughing throughout the procedure with intermittent hypoxia.       Variceal banding at this time would thus entail the risk of procedural       complications (bleeding and potentially the band falling off with       patient's coughing throughout the procedure) and thus risks of banding       would outweigh benefits at this time when beta blockers can be used for       primary prophylaxis more safely than EVL. The decision was thus made not       to band the varices at this time. (This was also discussed with       Anesthesia attending during the procedure and they would recommend       considering intubation in the future if EVL is needed due to patient's       coughing/reactive airway and intermittent hypoxia).      Moderate portal hypertensive gastropathy was found in the gastric fundus       and in the gastric body.      The examined duodenum was normal. Impression:           - Non-bleeding grade II esophageal varices.                       - Portal hypertensive gastropathy.                       - Normal examined duodenum.                       - No specimens collected. Recommendation:       - Give a beta blocker with dosage titrated by the heart                        rate.                       - Continue present medications.                       - Return to my office as previously scheduled.                       - Return to primary care physician as previously                        scheduled.                       - The findings and recommendations were discussed with                        the patient.                       - The findings and recommendations  were discussed with                        the patient's family. Procedure Code(s):    --- Professional ---                       [phone], Esophagogastroduodenoscopy, flexible, transoral;                         diagnostic, including collection of specimen(s) by                        brushing or washing, when performed (separate procedure) Diagnosis Code(s):    --- Professional ---                       K74.60, Unspecified cirrhosis of liver                       I85.10, Secondary esophageal varices without bleeding                       K76.6, Portal hypertension                       K31.89, Other diseases of stomach and duodenum CPT copyright 2018 American Medical Association. All rights reserved. The codes documented in this report are preliminary and upon coder review may  be revised to meet current compliance requirements.  [name], MD Michel Bickers B. Maximino Greenland MD, MD 06/16/2018 9:53:43 AM This report has been signed electronically. Number of Addenda: 0 Note Initiated On: 06/16/2018 8:57 AM      [facility]

## 2018-06-19 ENCOUNTER — Encounter: Payer: Self-pay | Admitting: Gastroenterology

## 2018-06-19 LAB — SURGICAL PATHOLOGY

## 2018-06-21 ENCOUNTER — Encounter: Payer: Self-pay | Admitting: Gastroenterology

## 2018-06-21 ENCOUNTER — Ambulatory Visit (INDEPENDENT_AMBULATORY_CARE_PROVIDER_SITE_OTHER): Payer: Commercial Managed Care - PPO | Admitting: Gastroenterology

## 2018-06-21 ENCOUNTER — Encounter (INDEPENDENT_AMBULATORY_CARE_PROVIDER_SITE_OTHER): Payer: Self-pay

## 2018-06-21 VITALS — BP 95/58 | HR 71 | Ht 67.5 in | Wt 217.8 lb

## 2018-06-21 DIAGNOSIS — K7460 Unspecified cirrhosis of liver: Secondary | ICD-10-CM | POA: Diagnosis not present

## 2018-06-21 DIAGNOSIS — Z23 Encounter for immunization: Secondary | ICD-10-CM

## 2018-06-21 DIAGNOSIS — D508 Other iron deficiency anemias: Secondary | ICD-10-CM

## 2018-06-21 DIAGNOSIS — R188 Other ascites: Secondary | ICD-10-CM

## 2018-06-22 ENCOUNTER — Other Ambulatory Visit: Payer: Self-pay

## 2018-06-22 ENCOUNTER — Telehealth: Payer: Self-pay

## 2018-06-22 ENCOUNTER — Encounter: Payer: Self-pay | Admitting: Gastroenterology

## 2018-06-22 DIAGNOSIS — R188 Other ascites: Principal | ICD-10-CM

## 2018-06-22 DIAGNOSIS — K7460 Unspecified cirrhosis of liver: Secondary | ICD-10-CM

## 2018-06-22 DIAGNOSIS — D508 Other iron deficiency anemias: Secondary | ICD-10-CM

## 2018-06-22 LAB — COMPREHENSIVE METABOLIC PANEL
A/G RATIO: 0.8 — AB (ref 1.2–2.2)
ALT: 42 IU/L (ref 0–44)
AST: 64 IU/L — ABNORMAL HIGH (ref 0–40)
Albumin: 3.4 g/dL — ABNORMAL LOW (ref 3.5–5.5)
Alkaline Phosphatase: 582 IU/L — ABNORMAL HIGH (ref 39–117)
BILIRUBIN TOTAL: 1.1 mg/dL (ref 0.0–1.2)
BUN / CREAT RATIO: 12 (ref 9–20)
BUN: 10 mg/dL (ref 6–24)
CHLORIDE: 104 mmol/L (ref 96–106)
CO2: 21 mmol/L (ref 20–29)
Calcium: 9 mg/dL (ref 8.7–10.2)
Creatinine, Ser: 0.81 mg/dL (ref 0.76–1.27)
GFR calc non Af Amer: 100 mL/min/{1.73_m2} (ref >59)
GFR, EST AFRICAN AMERICAN: 116 mL/min/{1.73_m2} (ref >59)
Globulin, Total: 4.1 g/dL (ref 1.5–4.5)
Glucose: 79 mg/dL (ref 65–99)
POTASSIUM: 4.8 mmol/L (ref 3.5–5.2)
Sodium: 136 mmol/L (ref 134–144)
TOTAL PROTEIN: 7.5 g/dL (ref 6.0–8.5)

## 2018-06-22 LAB — ANTI-SMOOTH MUSCLE ANTIBODY, IGG: Smooth Muscle Ab: 27 Units — ABNORMAL HIGH (ref 0–19)

## 2018-06-22 LAB — GAMMA GT: GGT: 354 IU/L — ABNORMAL HIGH (ref 0–65)

## 2018-06-22 LAB — PROTIME-INR
INR: 1.1 (ref 0.8–1.2)
Prothrombin Time: 11.3 s (ref 9.1–12.0)

## 2018-06-22 NOTE — Progress Notes (Signed)
Vonda Antigua, MD [address]  [place]  [place], [place] [address]  Main: [phone]  Fax: [phone]   Primary Care Physician: Elby Beck, FNP  Primary Gastroenterologist:  Dr. Vonda Antigua  Chief Complaint  Patient presents with  . Follow-up    cirrhosis of liver    HPI: Edwin West is a 57 y.o. male with history of alcoholic cirrhosis now abstinent here for follow-up.  Patient has clinically improved significantly since we first started seeing him, and since he has been abstaining from alcohol use.  Compliant with Lasix and spironolactone.  No diarrhea, constipation, abdominal pain, lower extremity edema, episodes of bleeding.  Abdominal distention improved.  Variceal screening EGD, June 16, 2018, nonbleeding grade 2 esophageal varices.  Portal hypertensive gastropathy.  Patient has since been on nadolol 20 mg once daily with no side effects.  Blood pressure today is 95/58, and has been systolic in the 86V before starting the medication as well.  No dizziness.  Screening colonoscopy, June 16, 2018.  Fair prep.  One 5 mm cecal polyp removed.  Repeat recommended in 1 year to with 2-day prep due to fair prep.  Current Outpatient Medications  Medication Sig Dispense Refill  . ferrous sulfate 325 (65 FE) MG EC tablet Take 1 tablet (325 mg total) by mouth 2 (two) times daily before a meal. 60 tablet 3  . furosemide (LASIX) 40 MG tablet Take 1 tablet (40 mg total) by mouth daily. 30 tablet 2  . nadolol (CORGARD) 20 MG tablet Take 1 tablet (20 mg total) by mouth daily. 30 tablet 1  . spironolactone (ALDACTONE) 100 MG tablet Take 1 tablet (100 mg total) by mouth daily. 30 tablet 2   No current facility-administered medications for this visit.     Allergies as of 06/21/2018  . (No Known Allergies)    ROS:  General: Negative for anorexia, weight loss, fever, chills, fatigue, weakness. ENT: Negative for hoarseness, difficulty swallowing  , nasal congestion. CV: Negative for chest pain, angina, palpitations, dyspnea on exertion, peripheral edema.  Respiratory: Negative for dyspnea at rest, dyspnea on exertion, cough, sputum, wheezing.  GI: See history of present illness. GU:  Negative for dysuria, hematuria, urinary incontinence, urinary frequency, nocturnal urination.  Endo: Negative for unusual weight change.    Physical Examination:   BP (!) 95/58   Pulse 71   Ht 5' 7.5" (1.715 m)   Wt 217 lb 12.8 oz (98.8 kg)   BMI 33.61 kg/m   General: Well-nourished, well-developed in no acute distress.  Eyes: No icterus. Conjunctivae pink. Mouth: Oropharyngeal mucosa moist and pink , no lesions erythema or exudate. Neck: Supple, Trachea midline Abdomen: Bowel sounds are normal, nontender, mildly distended, no hepatosplenomegaly or masses, no abdominal bruits or hernia , no rebound or guarding.   Extremities: No lower extremity edema. No clubbing or deformities. Neuro: Alert and oriented x 3.  Grossly intact. Skin: Warm and dry, no jaundice.   Psych: Alert and cooperative, normal mood and affect.   Labs: CMP     Component Value Date/Time   NA 136 06/21/2018 1519   K 4.8 06/21/2018 1519   CL 104 06/21/2018 1519   CO2 21 06/21/2018 1519   GLUCOSE 79 06/21/2018 1519   GLUCOSE 91 06/16/2018 0829   BUN 10 06/21/2018 1519   CREATININE 0.81 06/21/2018 1519   CALCIUM 9.0 06/21/2018 1519   PROT 7.5 06/21/2018 1519   ALBUMIN 3.4 (L) 06/21/2018 1519   AST 64 (H)  06/21/2018 1519   ALT 42 06/21/2018 1519   ALKPHOS 582 (H) 06/21/2018 1519   BILITOT 1.1 06/21/2018 1519   GFRNONAA 100 06/21/2018 1519   GFRAA 116 06/21/2018 1519   Lab Results  Component Value Date   WBC 5.4 05/17/2018   HGB 9.5 (L) 06/16/2018   HCT 28.0 (L) 06/16/2018   MCV 97.9 05/17/2018   PLT 163.0 05/17/2018    Imaging Studies: No results found.  Assessment and Plan:   Edwin West is a 58 y.o. y/o male here for follow-up of alcoholic  cirrhosis  Child Pugh class B, meld 8 based on 06/22/2018 labs from today Continue abstinence from alcohol Continue Lasix and spironolactone.  Kidney function normal. As abdominal ascites improves, can decrease Lasix and spironolactone in the future Continue nadolol 20 mg daily.  Will not increase dose today as systolic blood pressure in the 90s, asymptomatic. Can increase dose as tolerated on next visit  Alk phos remains persistently elevated in the 500s.  AST 64. Chronically elevated liver enzymes are likely due to cirrhosis Previous liver serology including viral and autoimmune hepatitis work-up has been unrevealing except for mildly elevated F-actin. This was reordered and results are pending. Based on results of the above, can proceed with further testing such as ultrasound liver Doppler and/or liver biopsy  Patient has a history of iron deficiency anemia with no etiology seen on EGD and colonoscopy. Patient referred to hematology Givens capsule scheduled.  Dr Vonda Antigua

## 2018-06-22 NOTE — Telephone Encounter (Signed)
Left message that Givens capsule study scheduled for 07-12-18. To contact [facility] the day prior to procedure for time (the number that was given on his prep instructions). If any questions to contact office.  Also left daughter Gershon Cullriscilla same message.

## 2018-06-23 ENCOUNTER — Telehealth: Payer: Self-pay

## 2018-06-23 NOTE — Telephone Encounter (Signed)
-----   Message from Pasty SpillersVarnita B Tahiliani, MD sent at 06/23/2018  8:42 AM EST ----- Eunice Blaseebbie please let patient know, his blood work shows persistently elevated alkaline phosphatase which is 1 of the other liver enzymes.  Because of this please order an ultrasound liver Doppler, indication elevated liver enzymes.

## 2018-06-23 NOTE — Telephone Encounter (Signed)
Left message for pt to contact office to schedule ultrasound liver doppler. Pt calls right back. Pt is willing to do this but he needs to keep his 40 hrs. At work. Works from 7-3:30 pm. Daughter who comes with pt is available on M, W, F. Pt aware that I will contact him next week with appt.

## 2018-06-26 ENCOUNTER — Other Ambulatory Visit: Payer: Self-pay

## 2018-06-26 DIAGNOSIS — R748 Abnormal levels of other serum enzymes: Secondary | ICD-10-CM

## 2018-06-26 NOTE — Telephone Encounter (Signed)
Daughter [phone] would like Debbie to call her concerning her dads lab work an upcomming appts

## 2018-06-26 NOTE — Telephone Encounter (Signed)
Left message to contact office. Left message that his Ultrasound liver doppler scheduled [facility]:  [facility]; arrival time 3:45pm. Nothing to eat or drink 8 hrs prior to your procedure.

## 2018-06-26 NOTE — Telephone Encounter (Signed)
Left message to contact office

## 2018-06-28 NOTE — Telephone Encounter (Signed)
I spoke with pt's daughter and she and pt got message regarding appt for ultrasound doppler. Pt would like to wait after small bowel capsule results to make sure he needs the referral to Hematology. I told her this was up to him but this was ordered also for hx of iron def. Anemia.

## 2018-06-30 ENCOUNTER — Ambulatory Visit
Admission: RE | Admit: 2018-06-30 | Discharge: 2018-06-30 | Disposition: A | Discharge status: Home / Self Care | Payer: Commercial Managed Care - PPO | Source: Ambulatory Visit | Attending: Gastroenterology | Admitting: Gastroenterology

## 2018-06-30 DIAGNOSIS — R748 Abnormal levels of other serum enzymes: Secondary | ICD-10-CM | POA: Diagnosis not present

## 2018-06-30 NOTE — Telephone Encounter (Signed)
Pt's daughter notified that Dr. Maximino Greenlandahiliani would like for pt to follow through with the hematology referral for hx iron deficiency anemia.

## 2018-07-12 ENCOUNTER — Ambulatory Visit
Admission: RE | Admit: 2018-07-12 | Discharge: 2018-07-12 | Disposition: A | Discharge status: Home / Self Care | Payer: Commercial Managed Care - PPO | Source: Ambulatory Visit | Attending: Gastroenterology | Admitting: Gastroenterology

## 2018-07-12 ENCOUNTER — Encounter
Admission: RE | Disposition: A | Discharge status: Home / Self Care | Payer: Self-pay | Source: Ambulatory Visit | Attending: Gastroenterology

## 2018-07-12 DIAGNOSIS — D509 Iron deficiency anemia, unspecified: Secondary | ICD-10-CM

## 2018-07-12 HISTORY — PX: GIVENS CAPSULE STUDY: SHX5432

## 2018-07-12 SURGERY — IMAGING PROCEDURE, GI TRACT, INTRALUMINAL, VIA CAPSULE

## 2018-07-13 ENCOUNTER — Encounter: Payer: Self-pay | Admitting: Gastroenterology

## 2018-07-19 ENCOUNTER — Ambulatory Visit (INDEPENDENT_AMBULATORY_CARE_PROVIDER_SITE_OTHER): Payer: Commercial Managed Care - PPO | Admitting: Gastroenterology

## 2018-07-19 ENCOUNTER — Encounter: Payer: Self-pay | Admitting: Gastroenterology

## 2018-07-19 VITALS — BP 108/67 | HR 85 | Ht 67.5 in | Wt 213.2 lb

## 2018-07-19 DIAGNOSIS — K7030 Alcoholic cirrhosis of liver without ascites: Secondary | ICD-10-CM | POA: Diagnosis not present

## 2018-07-19 DIAGNOSIS — D508 Other iron deficiency anemias: Secondary | ICD-10-CM | POA: Diagnosis not present

## 2018-07-19 MED ORDER — NADOLOL 20 MG PO TABS: 20.0000 mg | ORAL_TABLET | Freq: Every day | ORAL | 3 refills | Status: DC

## 2018-07-19 MED ORDER — SPIRONOLACTONE 50 MG PO TABS: 50.0000 mg | ORAL_TABLET | Freq: Every day | ORAL | 3 refills | Status: DC

## 2018-07-19 MED ORDER — FUROSEMIDE 20 MG PO TABS: 20.0000 mg | ORAL_TABLET | Freq: Every day | ORAL | 3 refills | Status: DC

## 2018-07-19 NOTE — Progress Notes (Signed)
 Varnita Tahiliani, MD [address]  [address]  [place], [place] [address]  Main: [phone]  Fax: [phone]   Primary Care Physician: Gessner, Deborah B, FNP  Primary Gastroenterologist:  Dr. Varnita Tahiliani  Chief Complaint  Patient presents with  . Follow-up    Cirrhosis of liver    HPI: Edwin West is a 57 y.o. male with history of alcoholic cirrhosis here for follow-up.  Patient continues to improve since being diagnosed with alcoholic cirrhosis.  He remains abstinent of alcohol.  Compliant with Lasix and spironolactone. The patient denies abdominal or flank pain, anorexia, nausea or vomiting, dysphagia, change in bowel habits or black or bloody stools or weight loss.  Variceal screening EGD, June 16, 2018, nonbleeding grade 2 esophageal varices.  Portal hypertensive gastropathy.  Patient has since been on nadolol 20 mg once daily with no side effects.    Screening colonoscopy, June 16, 2018.  Fair prep.  One 5 mm cecal polyp removed.  Repeat recommended in 1 year to with 2-day prep due to fair prep.  Current Outpatient Medications  Medication Sig Dispense Refill  . ferrous sulfate 325 (65 FE) MG EC tablet Take 1 tablet (325 mg total) by mouth 2 (two) times daily before a meal. 60 tablet 3  . furosemide (LASIX) 40 MG tablet Take 1 tablet (40 mg total) by mouth daily. 30 tablet 2  . nadolol (CORGARD) 20 MG tablet Take 1 tablet (20 mg total) by mouth daily. 30 tablet 1  . spironolactone (ALDACTONE) 100 MG tablet Take 1 tablet (100 mg total) by mouth daily. 30 tablet 2   No current facility-administered medications for this visit.     Allergies as of 07/19/2018  . (No Known Allergies)    ROS:  General: Negative for anorexia, weight loss, fever, chills, fatigue, weakness. ENT: Negative for hoarseness, difficulty swallowing , nasal congestion. CV: Negative for chest pain, angina, palpitations, dyspnea on exertion, peripheral edema.    Respiratory: Negative for dyspnea at rest, dyspnea on exertion, cough, sputum, wheezing.  GI: See history of present illness. GU:  Negative for dysuria, hematuria, urinary incontinence, urinary frequency, nocturnal urination.  Endo: Negative for unusual weight change.    Physical Examination:  Vitals:   07/19/18 1314  BP: 108/67  Pulse: 85  Weight: 213 lb 3.2 oz (96.7 kg)  Height: 5' 7.5" (1.715 m)    General: Well-nourished, well-developed in no acute distress.  Eyes: No icterus. Conjunctivae pink. Mouth: Oropharyngeal mucosa moist and pink , no lesions erythema or exudate. Neck: Supple, Trachea midline Abdomen: Bowel sounds are normal, nontender, nondistended, no hepatosplenomegaly or masses, no abdominal bruits or hernia , no rebound or guarding.   Extremities: No lower extremity edema. No clubbing or deformities. Neuro: Alert and oriented x 3.  Grossly intact. Skin: Warm and dry, no jaundice.   Psych: Alert and cooperative, normal mood and affect.   Labs: CMP     Component Value Date/Time   NA 136 06/21/2018 1519   K 4.8 06/21/2018 1519   CL 104 06/21/2018 1519   CO2 21 06/21/2018 1519   GLUCOSE 79 06/21/2018 1519   GLUCOSE 91 06/16/2018 0829   BUN 10 06/21/2018 1519   CREATININE 0.81 06/21/2018 1519   CALCIUM 9.0 06/21/2018 1519   PROT 7.5 06/21/2018 1519   ALBUMIN 3.4 (L) 06/21/2018 1519   AST 64 (H) 06/21/2018 1519   ALT 42 06/21/2018 1519   ALKPHOS 582 (H) 06/21/2018 1519   BILITOT 1.1 06/21/2018 1519     GFRNONAA 100 06/21/2018 1519   GFRAA 116 06/21/2018 1519   Lab Results  Component Value Date   WBC 5.4 05/17/2018   HGB 9.5 (L) 06/16/2018   HCT 28.0 (L) 06/16/2018   MCV 97.9 05/17/2018   PLT 163.0 05/17/2018    Imaging Studies: Us Liver Doppler  Result Date: 07/03/2018 CLINICAL DATA:  Elevated liver enzymes EXAM: DUPLEX ULTRASOUND OF LIVER TECHNIQUE: Color and duplex Doppler ultrasound was performed to evaluate the hepatic in-flow and out-flow  vessels. COMPARISON:  None. FINDINGS: Portal Vein Velocities Main:  42 cm/sec Right:  18 cm/sec Left:  14 cm/sec Hepatic Vein Velocities Right:  19 cm/sec Middle:  31 cm/sec Left:  22 cm/sec Hepatic Artery Velocity:  115 cm/sec Splenic Vein Velocity:  24 cm/sec Varices: Absent Ascites: Present Flow in the portal vein is hepatopedal while flow in the hepatic veins is hepatofugal. Splenic vein is patent with flow directed towards the liver. IMPRESSION: Portal, splenic, and back pains are patent with normal directionality of flow. Electronically Signed   By: Arthur  Hoss M.D.   On: 07/03/2018 07:53    Assessment and Plan:   Edwin West is a 57 y.o. y/o male here for follow-up of alcoholic cirrhosis  Child Pugh class B, meld 8 based on June 22, 2018 labs Continue alcohol abstinence Continue Lasix and spironolactone, kidney function normal Decrease Lasix to 20 mg daily and sprinolactone to 50 mg daily as no further abdominal distention or lower extremity edema Can discontinue on next visit if patient remains asymptomatic If patient develops abdominal distention, lower extremity edema or any other reason for concern he was asked to notify us and he verbalized understanding  Patient had elevated alk phos and GGT Previous liver serology including viral autoimmune hepatitis work-up has been unrevealing except for mildly elevated F-actin Ultrasound liver Doppler shows patent portal, splenic and hepatic veins. We will repeat alk phos and if still persistently elevated, consider liver biopsy  Patient has history of iron deficiency with no etiology seen on EGD and colonoscopy Patient was referred to hematology and states they will make an appointment soon Givens capsule done, results pending.  The Givens capsule software has been updated recently and has been giving an error when I tried to access it multiple times.  The Endo unit staff has contacted the capsule staff to get it corrected.   Therefore I have not been unable to read it and will try to read it once the software has been corrected.   Dr Varnita Tahiliani  

## 2018-07-19 NOTE — Addendum Note (Signed)
Addended by: Jackquline DenmarkIDGEWAY, Macarena Langseth W on: 07/19/2018 03:49 PM   Modules accepted: Orders

## 2018-07-20 ENCOUNTER — Encounter: Payer: Self-pay | Admitting: Gastroenterology

## 2018-07-20 ENCOUNTER — Telehealth: Payer: Self-pay | Admitting: Gastroenterology

## 2018-07-20 LAB — COMPREHENSIVE METABOLIC PANEL
ALT: 42 IU/L (ref 0–44)
AST: 65 IU/L — AB (ref 0–40)
Albumin/Globulin Ratio: 0.8 — ABNORMAL LOW (ref 1.2–2.2)
Albumin: 3.5 g/dL (ref 3.5–5.5)
Alkaline Phosphatase: 699 IU/L — ABNORMAL HIGH (ref 39–117)
BILIRUBIN TOTAL: 1.5 mg/dL — AB (ref 0.0–1.2)
BUN/Creatinine Ratio: 15 (ref 9–20)
BUN: 11 mg/dL (ref 6–24)
CALCIUM: 8.9 mg/dL (ref 8.7–10.2)
CHLORIDE: 102 mmol/L (ref 96–106)
CO2: 22 mmol/L (ref 20–29)
Creatinine, Ser: 0.75 mg/dL — ABNORMAL LOW (ref 0.76–1.27)
GFR calc Af Amer: 119 mL/min/{1.73_m2} (ref >59)
GFR calc non Af Amer: 103 mL/min/{1.73_m2} (ref >59)
GLOBULIN, TOTAL: 4.4 g/dL (ref 1.5–4.5)
GLUCOSE: 86 mg/dL (ref 65–99)
POTASSIUM: 4.7 mmol/L (ref 3.5–5.2)
Sodium: 136 mmol/L (ref 134–144)
Total Protein: 7.9 g/dL (ref 6.0–8.5)

## 2018-07-20 LAB — FERRITIN: FERRITIN: 51 ng/mL (ref 30–400)

## 2018-07-20 NOTE — Telephone Encounter (Signed)
Small bowel capsule study read and report sent for scanning.  In brief no active bleeding noted.  No specific lesions noted.  Nonspecific red spots seen in the small bowel.  Patient's daughter is present during patient's clinic visits and patient has given [place] permission to talk to the patient's daughter in regard to all his medical care.  I discussed with his daughter, Lannette Donath small bowel capsule results.  I did discuss that 1 of the red spots seem to have a small trace of red streak next to it.  We discussed the implications of this.  That most likely this is just a red spot and not active bleeding.  We did discuss options of further management with enteroscopy versus conservative management given that his hemoglobin is normal, he has no active bleeding, and his ferritin has responded well to iron replacement.  Risks and benefits of both options discussed in detail.  They would like to continue with conservative management not put him through risks of enteroscopy given that he has not had any active bleeding.  They will make an appointment with hematology for iron deficiency.  We discussed that his alk phos continues to be elevated and discussed need for liver biopsy.  They are agreeable and this will be scheduled.

## 2018-07-21 ENCOUNTER — Other Ambulatory Visit: Payer: Self-pay | Admitting: Gastroenterology

## 2018-07-26 ENCOUNTER — Encounter: Payer: Self-pay | Admitting: Gastroenterology

## 2018-07-28 ENCOUNTER — Ambulatory Visit: Payer: Self-pay | Admitting: Family Medicine

## 2018-08-01 ENCOUNTER — Other Ambulatory Visit: Payer: Self-pay

## 2018-08-01 DIAGNOSIS — R748 Abnormal levels of other serum enzymes: Secondary | ICD-10-CM

## 2018-08-07 ENCOUNTER — Other Ambulatory Visit: Payer: Self-pay

## 2018-08-08 ENCOUNTER — Telehealth: Payer: Self-pay

## 2018-08-08 ENCOUNTER — Other Ambulatory Visit: Payer: Self-pay

## 2018-08-08 NOTE — Telephone Encounter (Signed)
Left message with [facility] [phone], MRN to schedule liver biopsy. Office closes at noon today and opens again 08/10/18.

## 2018-08-08 NOTE — Telephone Encounter (Signed)
-----   Message from Pasty SpillersVarnita B Tahiliani, MD sent at 08/07/2018  4:47 PM EST ----- Regarding: RE: liver biopsy Just the pathology itself.  ----- Message ----- From: Fenton Mallingidgeway, Zamia Tyminski, LPN Sent: [id number]/30/2019   3:35 PM EST To: Pasty SpillersVarnita B Tahiliani, MD Subject: liver biopsy                                   Could you let me know what labs you want with his liver biopsy. I have the order pending. Thank you, Eunice Blaseebbie

## 2018-08-10 NOTE — Telephone Encounter (Signed)
[facility] calls and schedules liver biopsy for 08/16/2018 at 10 am. Arrival time 9:00 am. Left message for both daughter and patient. Also left message that RN will be calling from [facility] and to please check mobile if a number comes up that is unfamiliar.

## 2018-08-14 ENCOUNTER — Telehealth: Payer: Self-pay | Admitting: Gastroenterology

## 2018-08-14 NOTE — Telephone Encounter (Signed)
Pt daughter is calling regarding pt Liver biopsy she states pt wants to cancel it please call her

## 2018-08-15 NOTE — Telephone Encounter (Signed)
I spoke with pt's daughter and due to medical bills and insurance, pt wants to cancel liver biopsy for now. Will send recall letter for pt to contact office to reschedule this. Daughter inquired was there any blood work pt needed prior to his f/u visit on 10/25/18. I informed her that I would let Dr. Maximino Greenland know of her question and of need to cancel liver biopsy at this time.

## 2018-08-16 ENCOUNTER — Ambulatory Visit: Admission: RE | Admit: 2018-08-16 | Payer: Commercial Managed Care - PPO | Source: Ambulatory Visit

## 2018-08-25 ENCOUNTER — Encounter: Payer: Commercial Managed Care - PPO | Admitting: Family Medicine

## 2018-09-01 NOTE — Telephone Encounter (Signed)
Pt unable to get assistance due to he is working.

## 2018-09-08 ENCOUNTER — Encounter: Payer: Self-pay | Admitting: Family Medicine

## 2018-09-08 ENCOUNTER — Ambulatory Visit: Payer: Commercial Managed Care - PPO | Admitting: Family Medicine

## 2018-09-08 VITALS — BP 108/64 | HR 83 | Temp 97.8°F | Ht 67.5 in | Wt 223.0 lb

## 2018-09-08 DIAGNOSIS — J309 Allergic rhinitis, unspecified: Secondary | ICD-10-CM | POA: Diagnosis not present

## 2018-09-08 DIAGNOSIS — J312 Chronic pharyngitis: Secondary | ICD-10-CM | POA: Diagnosis not present

## 2018-09-08 DIAGNOSIS — Z23 Encounter for immunization: Secondary | ICD-10-CM | POA: Diagnosis not present

## 2018-09-08 DIAGNOSIS — R490 Dysphonia: Secondary | ICD-10-CM | POA: Diagnosis not present

## 2018-09-08 DIAGNOSIS — K7031 Alcoholic cirrhosis of liver with ascites: Secondary | ICD-10-CM

## 2018-09-08 MED ORDER — FLUTICASONE PROPIONATE 50 MCG/ACT NA SUSP: 2.0000 | Freq: Every day | NASAL | 6 refills | Status: DC

## 2018-09-08 NOTE — Patient Instructions (Addendum)
Good to see you today  Please take a daily cetirizine (generic Zyrtec) and start nasal spray nightly.  I have put in an ear, nose and throat referral- it is important that the specialist look at your throat  I will ask Dr. Randalyn Rhea about your diuretics and call you at the beginning of next week

## 2018-09-08 NOTE — Progress Notes (Signed)
Subjective:    Patient ID: Edwin West, male    DOB: [birth year], 58 y.o.   MRN: [id number]  HPI This is a 58 yo male, accompanied by his daughter, who presents today with sore throat, hoarseness x 7 months. Does not recall any trigger. Symptoms all the time. Pain on both sides of throat, feels dry.  Has tried throat lozenges without relief. Temporary relief with liquids. Occasional cough with "blood, tissue" from throat. Bad cough in morning. Some white sputum. Chronic post nasal drainage. No acid indigestion. Has decreased smoking. Wears a face shield at work, not a mask. 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 EGD 06/16/18,   History of alcoholic cirrhosis and esophageal varices. At last visit with Dr. Maximino Greenland (GI), she decreased his furosemide and spironolactone. Has noticed increased abdominal swelling, no leg swelling. Has not had liver biopsy due to cost. Has scale at home, weight up some, not as much as prior to diuretics being started.    Past Medical History:  Diagnosis Date  . Cirrhosis of liver (HCC) 04/2018   Past Surgical History:  Procedure Laterality Date  . COLONOSCOPY WITH PROPOFOL N/A 06/16/2018   Procedure: COLONOSCOPY WITH PROPOFOL;  Surgeon: Pasty Spillers, MD;  Location: [facility] ENDOSCOPY;  Service: Endoscopy;  Laterality: N/A;  . ESOPHAGOGASTRODUODENOSCOPY (EGD) WITH PROPOFOL N/A 06/16/2018   Procedure: ESOPHAGOGASTRODUODENOSCOPY (EGD) WITH PROPOFOL;  Surgeon: Pasty Spillers, MD;  Location: [facility] ENDOSCOPY;  Service: Endoscopy;  Laterality: N/A;  . GIVENS CAPSULE STUDY N/A 07/12/2018   Procedure: GIVENS CAPSULE STUDY;  Surgeon: Pasty Spillers, MD;  Location: [facility] ENDOSCOPY;  Service: Endoscopy;  Laterality: N/A;   Family History  Problem Relation Age of Onset  . Cancer Sister        cervical  . Heart failure Sister   . Cancer Other 49       breast cancer  . Heart failure Other    Social History   Tobacco Use  . Smoking status:  Current Every Day Smoker    Packs/day: 0.50    Types: Cigarettes  . Smokeless tobacco: Former Neurosurgeon    Types: Chew  Substance Use Topics  . Alcohol use: Not Currently  . Drug use: Not Currently    Types: Marijuana, Oxycodone    Comment: none since 20'      Review of Systems Per HPI    Objective:   Physical Exam Vitals signs reviewed.  Constitutional:      General: He is not in acute distress.    Appearance: He is well-developed. He is obese. He is not toxic-appearing.  HENT:     Head: Normocephalic and atraumatic.     Right Ear: Ear canal normal.     Left Ear: Ear canal normal.     Ears:     Comments: Bilateral TMs dull.     Nose: Congestion and rhinorrhea present.     Comments: Frequent throat clearing.     Mouth/Throat:     Mouth: Mucous membranes are moist. No oral lesions.     Pharynx: Uvula midline. Posterior oropharyngeal erythema (mild) present. No oropharyngeal exudate or uvula swelling.     Comments: Voice hoarse.  Neck:     Musculoskeletal: Normal range of motion and neck supple.     Thyroid: No thyromegaly.  Cardiovascular:     Rate and Rhythm: Normal rate and regular rhythm.     Heart sounds: Normal heart sounds.  Pulmonary:     Effort: Pulmonary effort is  normal.     Breath sounds: Normal breath sounds.  Abdominal:     General: There is distension.     Palpations: Abdomen is soft. There is no mass.     Tenderness: There is no abdominal tenderness. There is no guarding or rebound.  Musculoskeletal:     Right lower leg: Edema (trace) present.     Left lower leg: Edema (trace) present.  Lymphadenopathy:     Cervical: No cervical adenopathy.  Skin:    General: Skin is warm and dry.  Neurological:     Mental Status: He is alert and oriented to person, place, and time.  Psychiatric:        Mood and Affect: Mood normal.        Behavior: Behavior normal.       BP 108/64 (BP Location: Left Arm, Patient Position: Sitting, Cuff Size: Large)   Pulse  83   Temp 97.8 F (36.6 C) (Oral)   Ht 5' 7.5" (1.715 m)   Wt 231 lb (104.8 kg)   SpO2 97%   BMI 35.65 kg/m  Wt Readings from Last 3 Encounters:  09/08/18 223 lb (101.2 kg)  07/19/18 213 lb 3.2 oz (96.7 kg)  06/21/18 217 lb 12.8 oz (98.8 kg)    BP Readings from Last 3 Encounters:  09/08/18 108/64  07/19/18 108/67  06/21/18 (!) 95/58       Assessment & Plan:  1. Chronic hoarseness - Discussed importance of referral to ENT to rule out malignancy given ETOH and smoking history - may have component of post nasal drip and allergic rhinitis, but given severity and duration of symptoms, needs to have direct visualization.  - Ambulatory referral to ENT  2. Chronic sore throat - Ambulatory referral to ENT  3. Allergic rhinitis, unspecified seasonality, unspecified trigger - fluticasone (FLONASE) 50 MCG/ACT nasal spray; Place 2 sprays into both nostrils daily.  Dispense: 16 g; Refill: 6  4. Alcoholic cirrhosis of liver with ascites (HCC) - will contact his gastroenterologist about increasing one or both of his diuretics - continued follow up with GI  5. Need for 23-polyvalent pneumococcal polysaccharide vaccine - Pneumococcal polysaccharide vaccine 23-valent greater than or equal to 2yo subcutaneous/IM  - follow up in 3 months for CPE  Olean Reeeborah , FNP-BC  [facility], [facility]  09/10/2018 1:49 PM

## 2018-09-10 ENCOUNTER — Encounter: Payer: Self-pay | Admitting: Family Medicine

## 2018-09-11 ENCOUNTER — Telehealth: Payer: Self-pay | Admitting: Gastroenterology

## 2018-09-11 ENCOUNTER — Telehealth: Payer: Self-pay | Admitting: Family Medicine

## 2018-09-11 NOTE — Telephone Encounter (Signed)
-----   Message from Varnita B Tahiliani, MD sent at 09/11/2018 12:07 PM EST ----- °We will have him come to clinic to assess. Tati can you please call him and have him come in this week. Ok to overbook °----- Message ----- °From: Gessner, Deborah B, FNP °Sent: 09/08/2018   6:11 PM EST °To: Varnita B Tahiliani, MD ° °Hi Dr. Tahiliani,  °I saw Mr. Sperl today and his weight is up and he has increased abdominal distension. He has trace pretibial edema, no increased SOB. His spironolactone and furosemide were decreased at his last visit with you as he was doing so well. He continues to abstain from alcohol. He is aware of need to reschedule his liver biopsy.  °I told him that I would contact you to determine if he should go back up on his furosemide, spironolactone or both?  °Thank you for your help,  °Debbie Gessner, FNP-BC ° ° °

## 2018-09-11 NOTE — Telephone Encounter (Signed)
Dr. Michele Mcalpine (GI) office to call patient regarding follow up of weight gain/increased abdominal swelling.

## 2018-09-11 NOTE — Telephone Encounter (Signed)
Left vm for pt to call office and schedule f/u this week with Dr. Karie Schwalbe

## 2018-09-11 NOTE — Telephone Encounter (Signed)
I called daughter, Edwin West, to sch cpe for pt based on Debbie Gessner's msg below. If she call back please set up cpe.  DG msg-Will you please call Edwin West' daughter and schedule him for a CPE? He has trouble getting off work, I will do his CPE at a 4 o'clock slot if needed.

## 2018-09-12 ENCOUNTER — Telehealth: Payer: Self-pay | Admitting: Gastroenterology

## 2018-09-12 NOTE — Telephone Encounter (Signed)
-----   Message from Pasty Spillers, MD sent at 09/11/2018 12:07 PM EST ----- We will have him come to clinic to assess. Tati can you please call him and have him come in this week. Ok to [employer] ----- Message ----- From: Emi Belfast, FNP Sent: 09/08/2018   6:11 PM EST To: Pasty Spillers, MD  Hi Dr. Maximino Greenland,  I saw Mr. Shear today and his weight is up and he has increased abdominal distension. He has trace pretibial edema, no increased SOB. His spironolactone and furosemide were decreased at his last visit with you as he was doing so well. He continues to abstain from alcohol. He is aware of need to reschedule his liver biopsy.  I told him that I would contact you to determine if he should go back up on his furosemide, spironolactone or both?  Thank you for your help,  Deboraha Sprang, FNP-BC

## 2018-09-12 NOTE — Telephone Encounter (Signed)
Left vm for pt daughter to call office to get pt scheduled for an apt

## 2018-09-20 NOTE — Telephone Encounter (Signed)
Daughter called back scheduled appointment 2/28 for cpx @ 4

## 2018-10-04 ENCOUNTER — Encounter: Payer: Self-pay | Admitting: Gastroenterology

## 2018-10-04 ENCOUNTER — Ambulatory Visit: Payer: Commercial Managed Care - PPO | Admitting: Gastroenterology

## 2018-10-04 ENCOUNTER — Other Ambulatory Visit: Payer: Self-pay

## 2018-10-04 VITALS — BP 118/68 | HR 82 | Ht 67.5 in | Wt 221.6 lb

## 2018-10-04 DIAGNOSIS — K649 Unspecified hemorrhoids: Secondary | ICD-10-CM | POA: Diagnosis not present

## 2018-10-04 DIAGNOSIS — K7460 Unspecified cirrhosis of liver: Secondary | ICD-10-CM | POA: Diagnosis not present

## 2018-10-04 DIAGNOSIS — K7031 Alcoholic cirrhosis of liver with ascites: Secondary | ICD-10-CM | POA: Diagnosis not present

## 2018-10-04 MED ORDER — SPIRONOLACTONE 100 MG PO TABS: 100.0000 mg | ORAL_TABLET | Freq: Every day | ORAL | 3 refills | Status: DC

## 2018-10-04 MED ORDER — HYDROCORTISONE ACETATE 25 MG RE SUPP: 25.0000 mg | Freq: Two times a day (BID) | RECTAL | 0 refills | Status: DC

## 2018-10-04 MED ORDER — FUROSEMIDE 40 MG PO TABS: 40.0000 mg | ORAL_TABLET | Freq: Every day | ORAL | 3 refills | Status: DC

## 2018-10-04 NOTE — Progress Notes (Signed)
Vonda Antigua, MD [address]  [place]  [place], [place] [address]  Main: [phone]  Fax: [phone]   Primary Care Physician: Elby Beck, FNP  CC: Follow-up for cirrhosis  HPI: Edwin West is a 58 y.o. male with history of alcoholic cirrhosis, abstinent since October 2019, here for follow-up.  On last visit in December 2019 his Lasix and spironolactone has been decreased to Lasix 20 mg daily and spironolactone 50 mg daily as abdominal distention lower extremity edema had improved.  Patient reports increasing lower extremity edema since the decrease of his medications.  Also reports of abdominal distention.  Denies any abdominal pain.  No nausea or vomiting.  Does report noting minimal prior blood per rectum about 3 weeks ago.  This has not reoccurred.  Variceal screening EGD, June 16, 2018, nonbleeding grade 2 esophageal varices. Portal hypertensive gastropathy. Patient has since been on nadolol 20 mg once daily with no side effects.   Screening colonoscopy, June 16, 2018. Fair prep. One 5 mm cecal polyp removed. Repeat recommended in 1 year to with 2-day prep due to fair prep.  Small bowel capsule study with no active bleeding noted.  Nonspecific red spots seen.  1 of the red spots seem to have a small trace of red streak next to it.  This was discussed with patient and family after his report with options of enteroscopy versus conservative management.  They chose conservative management and patient continues to be stable from anemia perspective.  He was referred to hematology and he has not made this appointment yet.  However, his ferritin has improved with oral iron replacement.  Current Outpatient Medications  Medication Sig Dispense Refill  . ferrous sulfate 325 (65 FE) MG EC tablet Take 1 tablet (325 mg total) by mouth 2 (two) times daily before a meal. (Patient not taking: Reported on 09/08/2018) 60 tablet 3  . fluticasone (FLONASE) 50  MCG/ACT nasal spray Place 2 sprays into both nostrils daily. 16 g 6  . furosemide (LASIX) 20 MG tablet Take 1 tablet (20 mg total) by mouth daily. 30 tablet 3  . nadolol (CORGARD) 20 MG tablet Take 1 tablet (20 mg total) by mouth daily. (Patient not taking: Reported on 09/08/2018) 30 tablet 3  . spironolactone (ALDACTONE) 50 MG tablet Take 1 tablet (50 mg total) by mouth daily. 30 tablet 3   No current facility-administered medications for this visit.     Allergies as of 10/04/2018  . (No Known Allergies)    ROS:  General: Negative for anorexia, weight loss, fever, chills, fatigue, weakness. ENT: Negative for hoarseness, difficulty swallowing , nasal congestion. CV: Negative for chest pain, angina, palpitations, dyspnea on exertion, peripheral edema.  Respiratory: Negative for dyspnea at rest, dyspnea on exertion, cough, sputum, wheezing.  GI: See history of present illness. GU:  Negative for dysuria, hematuria, urinary incontinence, urinary frequency, nocturnal urination.  Endo: Negative for unusual weight change.    Physical Examination:  Vitals:   10/04/18 1523  BP: 118/68  Pulse: 82  Weight: 221 lb 9.6 oz (100.5 kg)  Height: 5' 7.5" (1.715 m)     General: Well-nourished, well-developed in no acute distress.  Eyes: No icterus. Conjunctivae pink. Mouth: Oropharyngeal mucosa moist and pink , no lesions erythema or exudate. Neck: Supple, Trachea midline Abdomen: Bowel sounds are normal, nontender, distended, no hepatosplenomegaly or masses, abdominal wall hernia present, no rebound or guarding.   Rectal exam: No external or perianal lesions.  Digital rectal  exam with no masses in the rectal vault.  No melena or bright red blood seen. Extremities: Mild, 1+ lower extremity edema. No clubbing or deformities. Neuro: Alert and oriented x 3.  Grossly intact. Skin: Warm and dry, no jaundice.   Psych: Alert and cooperative, normal mood and affect.   Labs: CMP     Component  Value Date/Time   NA 136 07/19/2018 1441   K 4.7 07/19/2018 1441   CL 102 07/19/2018 1441   CO2 22 07/19/2018 1441   GLUCOSE 86 07/19/2018 1441   GLUCOSE 91 06/16/2018 0829   BUN 11 07/19/2018 1441   CREATININE 0.75 (L) 07/19/2018 1441   CALCIUM 8.9 07/19/2018 1441   PROT 7.9 07/19/2018 1441   ALBUMIN 3.5 07/19/2018 1441   AST 65 (H) 07/19/2018 1441   ALT 42 07/19/2018 1441   ALKPHOS 699 (H) 07/19/2018 1441   BILITOT 1.5 (H) 07/19/2018 1441   GFRNONAA 103 07/19/2018 1441   GFRAA 119 07/19/2018 1441   Lab Results  Component Value Date   WBC 5.4 05/17/2018   HGB 9.5 (L) 06/16/2018   HCT 28.0 (L) 06/16/2018   MCV 97.9 05/17/2018   PLT 163.0 05/17/2018    Imaging Studies: No results found.  Assessment and Plan:   Edwin West is a 58 y.o. y/o male here for follow-up of alcoholic cirrhosis next  Patient continues to remain abstinent Meld 8 based on November 2019 labs We will repeat CMP and INR today for repeat meld  Patient has had elevated alk phos and GGT in the past Liver biopsy was ordered and patient has been asked to schedule this multiple times and has not done so Previous liver serology including viral and autoimmune hepatitis work-up has been unrevealing except for mildly elevated F-actin Ultrasound liver Doppler shows patent portal, splenic and hepatic veins We will reassess alk phos today  Patient has history of iron deficiency with no etiology seen on EGD and colonoscopy and small bowel capsule study.  Due to his report of minimal bright red blood per rectum, we have discussed high-fiber diet.  His last colonoscopy did not report any hemorrhoids.  However, we will attempt treatment with Anusol suppositories to see if it relieves his symptoms.  I will check a hemoglobin today as well.  We will increase his Lasix back to 40 mg and spironolactone 200 mg and reevaluate him in 4 to 6 weeks.  Continue abstinence  Dr Vonda Antigua

## 2018-10-05 LAB — COMPREHENSIVE METABOLIC PANEL
ALT: 38 IU/L (ref 0–44)
AST: 64 IU/L — ABNORMAL HIGH (ref 0–40)
Albumin/Globulin Ratio: 0.9 — ABNORMAL LOW (ref 1.2–2.2)
Albumin: 3.3 g/dL — ABNORMAL LOW (ref 3.8–4.9)
Alkaline Phosphatase: 659 IU/L — ABNORMAL HIGH (ref 39–117)
BUN / CREAT RATIO: 18 (ref 9–20)
BUN: 13 mg/dL (ref 6–24)
Bilirubin Total: 1.3 mg/dL — ABNORMAL HIGH (ref 0.0–1.2)
CO2: 20 mmol/L (ref 20–29)
Calcium: 8.4 mg/dL — ABNORMAL LOW (ref 8.7–10.2)
Chloride: 108 mmol/L — ABNORMAL HIGH (ref 96–106)
Creatinine, Ser: 0.72 mg/dL — ABNORMAL LOW (ref 0.76–1.27)
GFR calc Af Amer: 121 mL/min/{1.73_m2} (ref >59)
GFR, EST NON AFRICAN AMERICAN: 105 mL/min/{1.73_m2} (ref >59)
Globulin, Total: 3.5 g/dL (ref 1.5–4.5)
Glucose: 88 mg/dL (ref 65–99)
Potassium: 3.9 mmol/L (ref 3.5–5.2)
Sodium: 140 mmol/L (ref 134–144)
TOTAL PROTEIN: 6.8 g/dL (ref 6.0–8.5)

## 2018-10-05 LAB — PROTIME-INR
INR: 1.1 (ref 0.8–1.2)
Prothrombin Time: 11.1 s (ref 9.1–12.0)

## 2018-10-05 LAB — HEMOGLOBIN: Hemoglobin: 9.6 g/dL — ABNORMAL LOW (ref 13.0–17.7)

## 2018-10-06 ENCOUNTER — Ambulatory Visit (INDEPENDENT_AMBULATORY_CARE_PROVIDER_SITE_OTHER): Payer: Commercial Managed Care - PPO | Admitting: Family Medicine

## 2018-10-06 ENCOUNTER — Encounter: Payer: Self-pay | Admitting: Family Medicine

## 2018-10-06 ENCOUNTER — Telehealth: Payer: Self-pay

## 2018-10-06 VITALS — BP 118/74 | HR 84 | Temp 97.4°F | Ht 67.5 in | Wt 214.8 lb

## 2018-10-06 DIAGNOSIS — Z720 Tobacco use: Secondary | ICD-10-CM

## 2018-10-06 DIAGNOSIS — Z114 Encounter for screening for human immunodeficiency virus [HIV]: Secondary | ICD-10-CM

## 2018-10-06 DIAGNOSIS — Z0000 Encounter for general adult medical examination without abnormal findings: Secondary | ICD-10-CM

## 2018-10-06 DIAGNOSIS — F1011 Alcohol abuse, in remission: Secondary | ICD-10-CM

## 2018-10-06 DIAGNOSIS — R490 Dysphonia: Secondary | ICD-10-CM | POA: Diagnosis not present

## 2018-10-06 DIAGNOSIS — K429 Umbilical hernia without obstruction or gangrene: Secondary | ICD-10-CM

## 2018-10-06 DIAGNOSIS — Z125 Encounter for screening for malignant neoplasm of prostate: Secondary | ICD-10-CM | POA: Diagnosis not present

## 2018-10-06 DIAGNOSIS — Z13220 Encounter for screening for lipoid disorders: Secondary | ICD-10-CM

## 2018-10-06 NOTE — Telephone Encounter (Signed)
LVM for pt to call office regarding lab results.  Thanks Azha Constantin 

## 2018-10-06 NOTE — Patient Instructions (Addendum)
Good to see you today  Try to take your iron every other day- better than not at all  Call [facility] [phone] to change your appointment, it is currently scheduled for 3/5 at 9 am  Keeping you healthy  Get these tests  Blood pressure- Have your blood pressure checked once a year by your healthcare provider.  Normal blood pressure is 120/80  Weight- Have your body mass index (BMI) calculated to screen for obesity.  BMI is a measure of body fat based on height and weight. You can also calculate your own BMI at [email or web address].  Cholesterol- Have your cholesterol checked every year.  Diabetes- Have your blood sugar checked regularly if you have high blood pressure, high cholesterol, have a family history of diabetes or if you are overweight.  Screening for Colon Cancer- Colonoscopy starting at age 85.  Screening may begin sooner depending on your family history and other health conditions. Follow up colonoscopy as directed by your Gastroenterologist.  Screening for Prostate Cancer- Both blood work (PSA) and a rectal exam help screen for Prostate Cancer.  Screening begins at age 45 with African-American men and at age 31 with Caucasian men.  Screening may begin sooner depending on your family history.  Take these medicines  Aspirin- One aspirin daily can help prevent Heart disease and Stroke.  Flu shot- Every fall.  Tetanus- Every 10 years.  Zostavax- Once after the age of 38 to prevent Shingles.  Pneumonia shot- Once after the age of 12; if you are younger than 61, ask your healthcare provider if you need a Pneumonia shot.  Take these steps  Don't smoke- If you do smoke, talk to your doctor about quitting.  For tips on how to quit, go to www.smokefree.gov or call 1-800-QUIT-NOW.  Be physically active- Exercise 5 days a week for at least 30 minutes.  If you are not already physically active start slow and gradually work up to 30 minutes of moderate physical activity.   Examples of moderate activity include walking briskly, mowing the yard, dancing, swimming, bicycling, etc.  Eat a healthy diet- Eat a variety of healthy food such as fruits, vegetables, low fat milk, low fat cheese, yogurt, lean meant, poultry, fish, beans, tofu, etc. For more information go to www.thenutritionsource.org  Drink alcohol in moderation- Limit alcohol intake to less than two drinks a day. Never drink and drive.  Dentist- Brush and floss twice daily; visit your dentist twice a year.  Depression- Your emotional health is as important as your physical health. If you're feeling down, or losing interest in things you would normally enjoy please talk to your healthcare provider.  Eye exam- Visit your eye doctor every year.  Safe sex- If you may be exposed to a sexually transmitted infection, use a condom.  Seat belts- Seat belts can save your life; always wear one.  Smoke/Carbon Monoxide detectors- These detectors need to be installed on the appropriate level of your home.  Replace batteries at least once a year.  Skin cancer- When out in the sun, cover up and use sunscreen 15 SPF or higher.  Violence- If anyone is threatening you, please tell your healthcare provider.  Living Will/ Health care power of attorney- Speak with your healthcare provider and family.

## 2018-10-06 NOTE — Progress Notes (Signed)
Subjective:    Patient ID: Edwin West, male    DOB: May 18, 1961, 58 y.o.   MRN: [id number]  HPI This is a 57 yo male who presents today for complete physical. Has been doing ok. F  Last CPE- many years ago PSA- unknown Colonoscopy- 06/16/18 Tdap- he thinks with in the last 5 years Flu- annual Dental- over due Eye- over due Exercise- no regular exercise  Not taking iron due to constipation.   Cirrhosis- saw GI 10/04/18. Labs obtained. He continues to have elevated Alk phos, he has been advised to have a liver biopsy to further work up cause of persistently elevated LFTs. He stopped drinking alcohol 10/19. He reports difficulty with medical expenses and taking time off of work. His diuretics were increased at his last GI visit due to increased LE edema and abdominal swelling and bloating.   Chronic hoarseness- was seen for this 09/08/18. Has upcoming ENT appointment next week.     Past Medical History:  Diagnosis Date  . Cirrhosis of liver ([facility]) 04/2018   Past Surgical History:  Procedure Laterality Date  . COLONOSCOPY WITH PROPOFOL N/A 06/16/2018   Procedure: COLONOSCOPY WITH PROPOFOL;  Surgeon: Virgel Manifold, MD;  Location: [facility] ENDOSCOPY;  Service: Endoscopy;  Laterality: N/A;  . ESOPHAGOGASTRODUODENOSCOPY (EGD) WITH PROPOFOL N/A 06/16/2018   Procedure: ESOPHAGOGASTRODUODENOSCOPY (EGD) WITH PROPOFOL;  Surgeon: Virgel Manifold, MD;  Location: [facility]OSCOPY;  Service: Endoscopy;  Laterality: N/A;  . GIVENS CAPSULE STUDY N/A 07/12/2018   Procedure: GIVENS CAPSULE STUDY;  Surgeon: Virgel Manifold, MD;  Location: [facility]OSCOPY;  Service: Endoscopy;  Laterality: N/A;   Family History  Problem Relation Age of Onset  . Cancer Sister        cervical  . Heart failure Sister   . Cancer Other 49       breast cancer  . Heart failure Other    Social History   Tobacco Use  . Smoking status: Current Every Day Smoker    Packs/day: 0.50    Types: Cigarettes    . Smokeless tobacco: Former Systems developer    Types: Chew  Substance Use Topics  . Alcohol use: Not Currently  . Drug use: Not Currently    Types: Marijuana, Oxycodone    Comment: none since 20'      Review of Systems  Constitutional: Positive for fatigue (chronic). Negative for fever and unexpected weight change.  HENT: Positive for voice change.   Eyes: Negative.   Respiratory: Positive for cough (occasional phlegm, worse in am, worse at work) and shortness of breath (occasionally). Negative for wheezing.   Cardiovascular: Negative for leg swelling.  Gastrointestinal: Positive for abdominal distention.  Endocrine: Positive for cold intolerance.  Genitourinary: Negative.   Musculoskeletal: Negative.   Skin: Negative.   Allergic/Immunologic: Negative.   Neurological: Negative.   Hematological: Negative.   Psychiatric/Behavioral: Positive for sleep disturbance (5 hours a night).       Objective:   Physical Exam Constitutional:      General: He is not in acute distress.    Appearance: He is obese. He is not ill-appearing, toxic-appearing or diaphoretic.  HENT:     Head: Normocephalic and atraumatic.     Nose: Nose normal.     Mouth/Throat:     Mouth: Mucous membranes are moist.     Dentition: Abnormal dentition (missing and broken teeth). No gum lesions.     Tongue: No lesions.     Palate: No mass and lesions.  Pharynx: Oropharynx is clear. Uvula midline.  Eyes:     Conjunctiva/sclera: Conjunctivae normal.  Neck:     Musculoskeletal: Normal range of motion and neck supple. No neck rigidity or muscular tenderness.     Thyroid: No thyroid mass, thyromegaly or thyroid tenderness.  Cardiovascular:     Rate and Rhythm: Normal rate and regular rhythm.     Heart sounds: Normal heart sounds.  Pulmonary:     Effort: Pulmonary effort is normal.     Breath sounds: Normal breath sounds.  Abdominal:     General: Bowel sounds are normal. There is distension.     Palpations: There  is no mass.     Tenderness: There is no abdominal tenderness. There is no guarding or rebound.     Hernia: A hernia (umbilical, firm due to distension, no pain) is present.  Genitourinary:    Penis: Normal.      Scrotum/Testes: Normal.  Musculoskeletal:     Right lower leg: Edema (1+) present.     Left lower leg: Edema (1+) present.  Lymphadenopathy:     Cervical: No cervical adenopathy.  Skin:    General: Skin is warm and dry.     Coloration: Skin is pale. Skin is not jaundiced.  Neurological:     Mental Status: He is alert and oriented to person, place, and time.  Psychiatric:        Mood and Affect: Mood normal.        Behavior: Behavior normal.        Thought Content: Thought content normal.        Judgment: Judgment normal.       BP 118/74 (BP Location: Right Arm, Patient Position: Sitting, Cuff Size: Normal)   Pulse 84   Temp (!) 97.4 F (36.3 C) (Oral)   Ht 5' 7.5" (1.715 m)   Wt 214 lb 12.8 oz (97.4 kg)   SpO2 97%   BMI 33.15 kg/m  Wt Readings from Last 3 Encounters:  10/06/18 214 lb 12.8 oz (97.4 kg)  10/04/18 221 lb 9.6 oz (100.5 kg)  09/08/18 223 lb (101.2 kg)       Assessment & Plan:  1. Annual physical exam - Discussed and encouraged healthy lifestyle choices- adequate sleep, regular exercise, stress management and healthy food choices.   2. Hoarseness of voice - concerning with smoking history and history of alcoholism, has upcoming appointment with ENT - TSH  3. Screening for prostate cancer - PSA  4. Screening for lipid disorders - Lipid Panel  5. Screening for HIV without presence of risk factors - HIV Antibody (routine testing w rflx)  6. Alcohol abuse, in remission - encouraged continued abstinence  7. Umbilical hernia without obstruction and without gangrene - discussed RTC precautions if he develops increased bulging or pain  8. Tobacco abuse - he is not interested in quitting at this time, has been cutting back   Clarene Reamer, FNP-BC  [facility], [place]  10/08/2018 4:51 PM

## 2018-10-06 NOTE — Telephone Encounter (Signed)
-----   Message from Varnita B Tahiliani, MD sent at 10/06/2018 10:38 AM EST ----- Debbie please let patient know, his liver enzymes are still elevated. He should get his liver biopsy done at this time.  MELD 8 

## 2018-10-07 LAB — PSA: PSA: 0.5 ng/mL (ref <=4.0)

## 2018-10-07 LAB — LIPID PANEL
CHOLESTEROL: 143 mg/dL (ref <200)
HDL: 60 mg/dL (ref >=40)
LDL Cholesterol (Calc): 71 mg/dL (calc)
Non-HDL Cholesterol (Calc): 83 mg/dL (calc) (ref <130)
Total CHOL/HDL Ratio: 2.4 (calc) (ref <5.0)
Triglycerides: 50 mg/dL (ref <150)

## 2018-10-07 LAB — TSH: TSH: 5.6 mIU/L — ABNORMAL HIGH (ref 0.40–4.50)

## 2018-10-07 LAB — HIV ANTIBODY (ROUTINE TESTING W REFLEX): HIV 1&2 Ab, 4th Generation: NONREACTIVE

## 2018-10-08 ENCOUNTER — Encounter: Payer: Self-pay | Admitting: Family Medicine

## 2018-10-08 DIAGNOSIS — K429 Umbilical hernia without obstruction or gangrene: Secondary | ICD-10-CM | POA: Insufficient documentation

## 2018-10-08 DIAGNOSIS — Z720 Tobacco use: Secondary | ICD-10-CM | POA: Insufficient documentation

## 2018-10-08 DIAGNOSIS — F1011 Alcohol abuse, in remission: Secondary | ICD-10-CM | POA: Insufficient documentation

## 2018-10-08 DIAGNOSIS — R490 Dysphonia: Secondary | ICD-10-CM | POA: Insufficient documentation

## 2018-10-11 ENCOUNTER — Telehealth: Payer: Self-pay | Admitting: *Deleted

## 2018-10-11 NOTE — Telephone Encounter (Signed)
Left message on voicemail for patient to call back. See result note. 

## 2018-10-17 ENCOUNTER — Telehealth: Payer: Self-pay

## 2018-10-17 NOTE — Telephone Encounter (Signed)
-----   Message from Pasty Spillers, MD sent at 10/06/2018 10:38 AM EST ----- Eunice Blase please let patient know, his liver enzymes are still elevated. He should get his liver biopsy done at this time.  MELD 8

## 2018-10-17 NOTE — Telephone Encounter (Signed)
Patient has been informed his liver enzymes are still elevated and that you advise him to have a liver biopsy done.  Patient has not agreed to this, however he states that he will call back tomorrow.  Thanks [employer]

## 2018-10-18 DIAGNOSIS — R490 Dysphonia: Secondary | ICD-10-CM | POA: Diagnosis not present

## 2018-10-18 DIAGNOSIS — K219 Gastro-esophageal reflux disease without esophagitis: Secondary | ICD-10-CM | POA: Diagnosis not present

## 2018-10-18 DIAGNOSIS — Z720 Tobacco use: Secondary | ICD-10-CM | POA: Diagnosis not present

## 2018-10-18 DIAGNOSIS — J384 Edema of larynx: Secondary | ICD-10-CM | POA: Diagnosis not present

## 2018-10-25 ENCOUNTER — Ambulatory Visit: Payer: Commercial Managed Care - PPO | Admitting: Gastroenterology

## 2018-10-27 ENCOUNTER — Telehealth: Payer: Self-pay

## 2018-10-27 NOTE — Telephone Encounter (Signed)
Please try to call patient and give him his lab results.

## 2018-10-27 NOTE — Telephone Encounter (Signed)
Pt called no answer voice mail left for pt to call back 

## 2018-10-27 NOTE — Telephone Encounter (Signed)
Pt called with no answer voice mail left for pt to call back   Please call patient and let him know that his cholesterol and prostate test are normal. His HIV screening was negative. His thyroid is a little underactive and we will recheck this at his 73-month follow-up. Please schedule him a 64-month follow-up. Also, please ask him if he has his ENT appointment scheduled

## 2018-10-30 ENCOUNTER — Encounter: Payer: Self-pay | Admitting: Family Medicine

## 2018-10-30 NOTE — Telephone Encounter (Signed)
Letter sent.

## 2018-11-07 ENCOUNTER — Encounter: Admission: RE | Payer: Self-pay | Source: Home / Self Care

## 2018-11-07 ENCOUNTER — Ambulatory Visit
Admission: RE | Admit: 2018-11-07 | Payer: Commercial Managed Care - PPO | Source: Home / Self Care | Admitting: Otolaryngology

## 2018-11-07 SURGERY — MICROLARYNGOSCOPY
Anesthesia: General

## 2018-11-22 ENCOUNTER — Ambulatory Visit: Payer: Commercial Managed Care - PPO | Admitting: Gastroenterology

## 2018-11-27 ENCOUNTER — Encounter: Payer: Self-pay | Admitting: *Deleted

## 2018-11-27 ENCOUNTER — Other Ambulatory Visit: Payer: Self-pay

## 2018-11-27 ENCOUNTER — Ambulatory Visit (INDEPENDENT_AMBULATORY_CARE_PROVIDER_SITE_OTHER): Payer: Commercial Managed Care - PPO | Admitting: Gastroenterology

## 2018-11-27 DIAGNOSIS — Z5329 Procedure and treatment not carried out because of patient's decision for other reasons: Secondary | ICD-10-CM

## 2019-01-25 ENCOUNTER — Other Ambulatory Visit: Payer: Self-pay | Admitting: Gastroenterology

## 2019-01-29 NOTE — Telephone Encounter (Signed)
Patient needs clinic appointment for further refills

## 2019-02-05 ENCOUNTER — Telehealth: Payer: Self-pay | Admitting: Gastroenterology

## 2019-02-05 NOTE — Telephone Encounter (Signed)
-----   Message from Varnita B Tahiliani, MD sent at 01/29/2019  9:14 AM EDT ----- ° Please set up clinic appointment with me in 3 to 4 weeks ° °

## 2019-02-05 NOTE — Telephone Encounter (Signed)
Left vm for pt to call office and schedule f/u apt 3 weeks

## 2019-02-19 ENCOUNTER — Telehealth: Payer: Self-pay | Admitting: Gastroenterology

## 2019-02-19 ENCOUNTER — Encounter: Payer: Self-pay | Admitting: Gastroenterology

## 2019-02-19 NOTE — Telephone Encounter (Signed)
-----   Message from Varnita B Tahiliani, MD sent at 01/29/2019  9:14 AM EDT ----- ° Please set up clinic appointment with me in 3 to 4 weeks ° °

## 2019-02-19 NOTE — Telephone Encounter (Signed)
-----   Message from Virgel Manifold, MD sent at 01/29/2019  9:14 AM EDT -----  Please set up clinic appointment with me in 3 to 4 weeks

## 2019-02-19 NOTE — Telephone Encounter (Signed)
Left vm for pt to call office and schedule f/u apt

## 2019-02-19 NOTE — Telephone Encounter (Signed)
Letter sent.

## 2019-02-28 ENCOUNTER — Other Ambulatory Visit: Payer: Self-pay | Admitting: Gastroenterology

## 2019-03-05 ENCOUNTER — Telehealth: Payer: Self-pay | Admitting: Gastroenterology

## 2019-03-05 NOTE — Telephone Encounter (Signed)
-----   Message from Debbie Ridgeway, LPN sent at 03/05/2019 11:46 AM EDT ----- °This pt needs to be seen in clinic. Schedule follow up in 2-4 weeks. Last refill sent ° °

## 2019-03-05 NOTE — Telephone Encounter (Signed)
Left vm to schedule apt  °

## 2019-03-14 ENCOUNTER — Telehealth: Payer: Self-pay | Admitting: Gastroenterology

## 2019-03-14 ENCOUNTER — Encounter: Payer: Self-pay | Admitting: Gastroenterology

## 2019-03-14 NOTE — Telephone Encounter (Signed)
Left vm to schedule apt letter sent also

## 2019-03-14 NOTE — Telephone Encounter (Signed)
-----   Message from Martie Lee, LPN sent at 5/32/0233 11:46 AM EDT ----- This pt needs to be seen in clinic. Schedule follow up in 2-4 weeks. Last refill sent

## 2019-03-26 ENCOUNTER — Telehealth: Payer: Self-pay

## 2019-03-26 NOTE — Telephone Encounter (Signed)
Patients daughter Lannette Donath has contacted the office in regards to scheduling her fathers follow up office visit with Dr. Bonna Gains.  This is noted in patients chart follow up 2-4 weeks.  Please call daughter to schedule her fathers follow up.  Lannette Donath [phone]  Thanks Sharyn Lull

## 2019-03-29 ENCOUNTER — Other Ambulatory Visit: Payer: Self-pay | Admitting: Gastroenterology

## 2019-04-25 ENCOUNTER — Other Ambulatory Visit: Payer: Self-pay

## 2019-04-25 ENCOUNTER — Encounter: Payer: Self-pay | Admitting: Gastroenterology

## 2019-04-25 ENCOUNTER — Encounter (INDEPENDENT_AMBULATORY_CARE_PROVIDER_SITE_OTHER): Payer: Self-pay

## 2019-04-25 ENCOUNTER — Ambulatory Visit: Payer: Commercial Managed Care - PPO | Admitting: Gastroenterology

## 2019-04-25 VITALS — BP 123/74 | HR 80 | Temp 97.9°F | Wt 225.0 lb

## 2019-04-25 DIAGNOSIS — K7031 Alcoholic cirrhosis of liver with ascites: Secondary | ICD-10-CM

## 2019-04-25 MED ORDER — CARVEDILOL 6.25 MG PO TABS: 6.2500 mg | ORAL_TABLET | Freq: Every day | ORAL | 0 refills | Status: DC

## 2019-04-25 MED ORDER — LACTULOSE 10 G PO PACK: 10.0000 g | PACK | Freq: Every day | ORAL | 1 refills | Status: AC

## 2019-04-25 NOTE — Patient Instructions (Addendum)
[facility]15[facility]. Nothing to eat or drank after midnight.  If you need to rescheduled this please call [phone]

## 2019-04-25 NOTE — Progress Notes (Signed)
Edwin Antigua, MD [address]  [place], [place] [address]  Main: [phone]  Fax: [phone]   Primary Care Physician: Edwin Beck, FNP   Chief Complaint  Patient presents with  . Cirrhosis    Patient has been fatigue. Has had abdominal pain constipation and diarrhea     HPI: Edwin West is a 58 y.o. male with history of alcoholic cirrhosis, abstinent since October 2019 here for follow-up.  Noncompliant with previous follow-ups and work-up.  Has had elevated liver enzymes in the past liver biopsy recommended which she has not done despite multiple requests to do so.  Taking Lasix 40 mg daily and spironolactone 100 mg daily.  Has not noted any increased abdominal distention or lower extremity edema.  Has an obese abdomen otherwise.  Was also previously on nadolol which he states he has not been taking.  No episodes of bleeding or confusion.  However does report some drowsiness.  No nausea or vomiting.  No altered bowel habits, blood in stool, weight loss.  Variceal screening EGD, June 16, 2018, nonbleeding grade 2 esophageal varices. Portal hypertensive gastropathy. Patient has since been on nadolol 20 mg once daily with no side effects.  Screening colonoscopy, June 16, 2018. Fair prep. One 5 mm cecal polyp removed. Repeat recommended in 1 year to with 2-day prep due to fair prep.  Small bowel capsule study with no active bleeding noted.  Nonspecific red spots seen.  1 of the red spots seem to have a small trace of red streak next to it.  This was discussed with patient and family after his report with options of enteroscopy versus conservative management.  They chose conservative management and patient continues to be stable from anemia perspective.  He was referred to hematology and he has not made this appointment yet.  However, his ferritin has improved with oral iron replacement.  Current Outpatient Medications  Medication  Sig Dispense Refill  . furosemide (LASIX) 40 MG tablet TAKE 1 TABLET BY MOUTH EVERY DAY 30 tablet 0  . spironolactone (ALDACTONE) 100 MG tablet TAKE 1 TABLET BY MOUTH EVERY DAY 30 tablet 0  . ferrous sulfate 325 (65 FE) MG EC tablet Take 1 tablet (325 mg total) by mouth 2 (two) times daily before a meal. (Patient not taking: Reported on 10/06/2018) 60 tablet 3   No current facility-administered medications for this visit.     Allergies as of 04/25/2019  . (No Known Allergies)    ROS:  General: Negative for anorexia, weight loss, fever, chills, fatigue, weakness. ENT: Negative for hoarseness, difficulty swallowing , nasal congestion. CV: Negative for chest pain, angina, palpitations, dyspnea on exertion, peripheral edema.  Respiratory: Negative for dyspnea at rest, dyspnea on exertion, cough, sputum, wheezing.  GI: See history of present illness. GU:  Negative for dysuria, hematuria, urinary incontinence, urinary frequency, nocturnal urination.  Endo: Negative for unusual weight change.    Physical Examination:   BP 123/74 (BP Location: Left Arm, Patient Position: Sitting, Cuff Size: Normal)   Pulse 80   Temp 97.9 F (36.6 C) (Oral)   Wt 225 lb (102.1 kg)   BMI 34.72 kg/m   General: Well-nourished, well-developed in no acute distress.  Eyes: No icterus. Conjunctivae pink. Mouth: Oropharyngeal mucosa moist and pink , no lesions erythema or exudate. Neck: Supple, Trachea midline Abdomen: Bowel sounds are normal, nontender, nondistended, no hepatosplenomegaly or masses, no abdominal bruits or hernia , no rebound or guarding.   Extremities:  No lower extremity edema. No clubbing or deformities. Neuro: Alert and oriented x 3.  Grossly intact. Skin: Warm and dry, no jaundice.   Psych: Alert and cooperative, normal mood and affect.   Labs: CMP     Component Value Date/Time   NA 140 10/04/2018 1555   K 3.9 10/04/2018 1555   CL 108 (H) 10/04/2018 1555   CO2 20 10/04/2018 1555    GLUCOSE 88 10/04/2018 1555   GLUCOSE 91 06/16/2018 0829   BUN 13 10/04/2018 1555   CREATININE 0.72 (L) 10/04/2018 1555   CALCIUM 8.4 (L) 10/04/2018 1555   PROT 6.8 10/04/2018 1555   ALBUMIN 3.3 (L) 10/04/2018 1555   AST 64 (H) 10/04/2018 1555   ALT 38 10/04/2018 1555   ALKPHOS 659 (H) 10/04/2018 1555   BILITOT 1.3 (H) 10/04/2018 1555   GFRNONAA 105 10/04/2018 1555   GFRAA 121 10/04/2018 1555   Lab Results  Component Value Date   WBC 5.4 05/17/2018   HGB 9.6 (L) 10/04/2018   HCT 28.0 (L) 06/16/2018   MCV 97.9 05/17/2018   PLT 163.0 05/17/2018    Imaging Studies: No results found.  Assessment and Plan:   [name] is a 58 y.o. y/o male with alcoholic cirrhosis, abstinent since October 2019, here for follow-up  Importance of compliance discussed again We will repeat labs his last labs were in February 2020  I expect liver enzymes to continue to be elevated like they were before next still high, I would recommend liver biopsy again like before  Continue to avoid hepatotoxic drugs including alcohol Previous liver serology including viral and autoimmune hepatitis work-up has been unrevealing except for mildly elevated F-actin Ultrasound liver Doppler shows patent portal, splenic and hepatic veins  I will start him on lactulose once daily due to his reports of intermittent drowsiness  Since he has not been taking nadolol, we will switch him to Coreg and start at 6.25 once daily and increase if tolerated  He has not made his appointment with hematology for iron deficiency anemia  Continue Lasix and spironolactone  We will get right upper quadrant ultrasound for HCC screening and if ascites present, order paracentesis     Dr Melodie BouillonVarnita West

## 2019-04-26 LAB — PROTIME-INR
INR: 1 (ref 0.8–1.2)
Prothrombin Time: 10.9 s (ref 9.1–12.0)

## 2019-04-26 LAB — COMPREHENSIVE METABOLIC PANEL
ALT: 42 IU/L (ref 0–44)
AST: 61 IU/L — ABNORMAL HIGH (ref 0–40)
Albumin/Globulin Ratio: 1.3 (ref 1.2–2.2)
Albumin: 3.6 g/dL — ABNORMAL LOW (ref 3.8–4.9)
Alkaline Phosphatase: 589 IU/L — ABNORMAL HIGH (ref 39–117)
BUN/Creatinine Ratio: 16 (ref 9–20)
BUN: 12 mg/dL (ref 6–24)
Bilirubin Total: 1.1 mg/dL (ref 0.0–1.2)
CO2: 19 mmol/L — ABNORMAL LOW (ref 20–29)
Calcium: 8.6 mg/dL — ABNORMAL LOW (ref 8.7–10.2)
Chloride: 107 mmol/L — ABNORMAL HIGH (ref 96–106)
Creatinine, Ser: 0.75 mg/dL — ABNORMAL LOW (ref 0.76–1.27)
GFR calc Af Amer: 119 mL/min/{1.73_m2} (ref >59)
GFR calc non Af Amer: 103 mL/min/{1.73_m2} (ref >59)
Globulin, Total: 2.8 g/dL (ref 1.5–4.5)
Glucose: 85 mg/dL (ref 65–99)
Potassium: 4.1 mmol/L (ref 3.5–5.2)
Sodium: 137 mmol/L (ref 134–144)
Total Protein: 6.4 g/dL (ref 6.0–8.5)

## 2019-04-26 LAB — CBC
Hematocrit: 28.9 % — ABNORMAL LOW (ref 37.5–51.0)
Hemoglobin: 9.5 g/dL — ABNORMAL LOW (ref 13.0–17.7)
MCH: 29.9 pg (ref 26.6–33.0)
MCHC: 32.9 g/dL (ref 31.5–35.7)
MCV: 91 fL (ref 79–97)
Platelets: 141 10*3/uL — ABNORMAL LOW (ref 150–450)
RBC: 3.18 x10E6/uL — ABNORMAL LOW (ref 4.14–5.80)
RDW: 18.3 % — ABNORMAL HIGH (ref 11.6–15.4)
WBC: 5.3 10*3/uL (ref 3.4–10.8)

## 2019-04-26 LAB — GAMMA GT: GGT: 481 IU/L — ABNORMAL HIGH (ref 0–65)

## 2019-04-28 ENCOUNTER — Other Ambulatory Visit: Payer: Self-pay | Admitting: Gastroenterology

## 2019-04-30 ENCOUNTER — Telehealth: Payer: Self-pay

## 2019-04-30 NOTE — Telephone Encounter (Signed)
Tried to call patient but patient did not say anything when he answered

## 2019-04-30 NOTE — Telephone Encounter (Signed)
-----   Message from Virgel Manifold, MD sent at 04/27/2019 12:57 PM EDT ----- His liver enzymes are still elevated. I recommend liver biopsy for further evaluation.

## 2019-04-30 NOTE — Telephone Encounter (Signed)
Called and left a message for call back  

## 2019-04-30 NOTE — Telephone Encounter (Signed)
Last office visit 01/08/55 Alcoholic cirrhosis  Last refill 03/29/19 Lasix 40mg  30 tab 0 refills  Spironolactone 100mg  03/2019 30 tab 0 refills  Return in 4-6 weeks  Has appointment 06/27/2019

## 2019-04-30 NOTE — Telephone Encounter (Signed)
Tried to call patient. The number rang and then no one said anything. Will try again later

## 2019-05-01 NOTE — Telephone Encounter (Signed)
Patient states at this time he does not want to have the liver biopsy. Patient states he will give [place] a call back when he is ready.

## 2019-05-01 NOTE — Telephone Encounter (Signed)
Called and left a message for call back  

## 2019-05-01 NOTE — Telephone Encounter (Signed)
Pt is returning a call for Caryl Pina he aware to be on a look out for her call

## 2019-05-14 ENCOUNTER — Ambulatory Visit: Payer: Commercial Managed Care - PPO

## 2019-05-30 ENCOUNTER — Telehealth: Payer: Self-pay | Admitting: Gastroenterology

## 2019-05-30 NOTE — Telephone Encounter (Signed)
We received return mail on patient ([place], [place]  [id number]) Please get mailing address.

## 2019-06-09 ENCOUNTER — Ambulatory Visit: Admit: 2019-06-09 | Payer: Commercial Managed Care - PPO | Admitting: Otolaryngology

## 2019-06-09 SURGERY — MICROLARYNGOSCOPY
Anesthesia: General

## 2019-06-27 ENCOUNTER — Ambulatory Visit: Payer: Commercial Managed Care - PPO | Admitting: Gastroenterology

## 2019-07-27 ENCOUNTER — Other Ambulatory Visit: Payer: Self-pay | Admitting: Gastroenterology

## 2019-07-27 NOTE — Telephone Encounter (Signed)
Last office visit 82/88/3374 Alcoholic cirrhosis of liver  Last refill Lasix 04/30/19 0 refills  Spironolactone 04/30/2019 0 refills  Has appointment 07/30/2019

## 2019-07-30 ENCOUNTER — Ambulatory Visit: Payer: Commercial Managed Care - PPO | Admitting: Gastroenterology

## 2019-09-11 ENCOUNTER — Ambulatory Visit: Payer: Commercial Managed Care - PPO | Attending: Internal Medicine

## 2019-09-11 DIAGNOSIS — Z20822 Contact with and (suspected) exposure to covid-19: Secondary | ICD-10-CM

## 2019-09-12 LAB — NOVEL CORONAVIRUS, NAA: SARS-CoV-2, NAA: NOT DETECTED

## 2019-10-11 ENCOUNTER — Other Ambulatory Visit: Payer: Self-pay | Admitting: Gastroenterology

## 2019-10-11 NOTE — Telephone Encounter (Signed)
Last office visit 04/25/2019 Alcoholic cirrhosis  Last refill Lasix 07/27/2019 1 refills 30 tablets  Spironolactone 07/27/2019 30 tab 1 refill  Sent mychart message is due for appointment

## 2019-11-26 ENCOUNTER — Other Ambulatory Visit: Payer: Self-pay | Admitting: Gastroenterology

## 2019-11-26 NOTE — Telephone Encounter (Signed)
Last office visit 04/25/2019 Alcoholic cirrhosis  Last refill Spironolactone 10/11/2019 0 refills  Lasix 10/11/2019 0 refills  Was told on 10/11/2019 needed appointment

## 2019-11-27 NOTE — Telephone Encounter (Signed)
Pt will need an appointment before any further refills after this

## 2020-01-27 ENCOUNTER — Other Ambulatory Visit: Payer: Self-pay | Admitting: Gastroenterology

## 2020-01-28 ENCOUNTER — Other Ambulatory Visit: Payer: Self-pay | Admitting: Gastroenterology

## 2020-01-28 NOTE — Telephone Encounter (Signed)
Last office visit 04/25/19 Alcoholic cirrhosis  Last refill 11/27/2019 0 refills  No appointment is scheduled  

## 2020-01-28 NOTE — Telephone Encounter (Signed)
Pt needs an appointment. Only 10 days sent, please overbook before further refills

## 2020-01-28 NOTE — Telephone Encounter (Signed)
Last office visit 04/25/19 Alcoholic cirrhosis  Last refill 11/27/2019 0 refills  No appointment is scheduled

## 2020-03-12 ENCOUNTER — Other Ambulatory Visit: Payer: Self-pay | Admitting: Gastroenterology

## 2020-04-09 ENCOUNTER — Ambulatory Visit: Payer: Commercial Managed Care - PPO | Admitting: Gastroenterology

## 2020-04-09 ENCOUNTER — Other Ambulatory Visit: Payer: Self-pay

## 2020-04-09 ENCOUNTER — Encounter: Payer: Self-pay | Admitting: Gastroenterology

## 2020-04-09 VITALS — BP 126/69 | HR 80 | Temp 98.3°F | Ht 69.0 in | Wt 205.0 lb

## 2020-04-09 DIAGNOSIS — K7031 Alcoholic cirrhosis of liver with ascites: Secondary | ICD-10-CM | POA: Diagnosis not present

## 2020-04-09 DIAGNOSIS — R748 Abnormal levels of other serum enzymes: Secondary | ICD-10-CM | POA: Diagnosis not present

## 2020-04-09 MED ORDER — FUROSEMIDE 40 MG PO TABS: 40.0000 mg | ORAL_TABLET | Freq: Every day | ORAL | 0 refills | Status: DC

## 2020-04-09 MED ORDER — SPIRONOLACTONE 100 MG PO TABS: 100.0000 mg | ORAL_TABLET | Freq: Every day | ORAL | 0 refills | Status: DC

## 2020-04-09 NOTE — Progress Notes (Signed)
[name], MD [address]  [address]  [place], [place] [address]  Main: [phone]  Fax: [phone]   Primary Care Physician: Emi Belfast, FNP  CC: Cirrhosis  HPI: Edwin West is a 59 y.o. male with history of alcoholic liver cirrhosis, with noncompliance with follow-up, with multiple no-shows in the past, presents for follow-up after almost a year.  Was previously on Lasix, Aldactone.  Has been continuing to take this since last visit. Was also on Coreg for beta-blocker, due to varices noted on EGD.  Has not been taking this.  Has not had any lab work done recently either.  Liver biopsy was previously recommended due to persistently elevated liver enzymes and patient still has not had this done.  Denies any bleeding.  Denies any confusion.  Denies any alcohol intake since previous visits.  Does report some abdominal distention.  Previous history: Has had elevated liver enzymes in the past liver biopsy recommended which he has not done despite multiple requests to do so.   Has an obese abdomen otherwise.  Ultrasound liver Doppler shows patent portal, splenic and hepatic veins  Previous liver serology including viral and autoimmune hepatitis work-up has been unrevealing except for mildly elevated F-actin antibody  Was also previously on nadolol which he states he has not been taking.  No episodes of bleeding or confusion.  However does report some drowsiness.  No nausea or vomiting.  No altered bowel habits, blood in stool, weight loss.  Variceal screening EGD, June 16, 2018, nonbleeding grade 2 esophageal varices. Portal hypertensive gastropathy. Patient has since been on nadolol 20 mg once daily with no side effects.  Screening colonoscopy, June 16, 2018. Fair prep. One 5 mm cecal polyp removed. Repeat recommended in 1 year to with 2-day prep due to fair prep.  Small bowel capsule study with no active bleeding noted.   Nonspecific red spots seen.  1 of the red spots seem to have a small trace of red streak next to it.  This was discussed with patient and family after his report with options of enteroscopy versus conservative management.  They chose conservative management and patient continues to be stable from anemia perspective.  He was referred to hematology and he has not made this appointment yet.  However, his ferritin has improved with oral iron replacement.  Current Outpatient Medications  Medication Sig Dispense Refill  . carvedilol (COREG) 6.25 MG tablet Take 1 tablet (6.25 mg total) by mouth daily. 60 tablet 0  . ferrous sulfate 325 (65 FE) MG EC tablet Take 1 tablet (325 mg total) by mouth 2 (two) times daily before a meal. (Patient not taking: Reported on 10/06/2018) 60 tablet 3  . furosemide (LASIX) 40 MG tablet TAKE 1 TABLET BY MOUTH EVERY DAY 10 tablet 0  . spironolactone (ALDACTONE) 100 MG tablet TAKE 1 TABLET BY MOUTH EVERY DAY 10 tablet 0   No current facility-administered medications for this visit.    Allergies as of 04/09/2020  . (No Known Allergies)    ROS:  General: Negative for anorexia, weight loss, fever, chills, fatigue, weakness. ENT: Negative for hoarseness, difficulty swallowing , nasal congestion. CV: Negative for chest pain, angina, palpitations, dyspnea on exertion, peripheral edema.  Respiratory: Negative for dyspnea at rest, dyspnea on exertion, cough, sputum, wheezing.  GI: See history of present illness. GU:  Negative for dysuria, hematuria, urinary incontinence, urinary frequency, nocturnal urination.  Endo: Negative for unusual weight change.    Physical Examination:  Vitals:   04/09/20 1545  BP: 126/69  Pulse: 80  Temp: 98.3 F (36.8 C)  TempSrc: Oral  Weight: 205 lb (93 kg)  Height: 5\' 9"  (1.753 m)    General: Well-nourished, well-developed in no acute distress.  Eyes: No icterus. Conjunctivae pink. Mouth: Oropharyngeal mucosa moist and pink , no  lesions erythema or exudate. Neck: Supple, Trachea midline Abdomen: Bowel sounds are normal, nontender, obese abdomen, positive fluid wave, no hepatosplenomegaly or masses, no abdominal bruits or hernia , no rebound or guarding.   Extremities: No lower extremity edema. No clubbing or deformities. Neuro: Alert and oriented x 3.  Grossly intact. Skin: Warm and dry, no jaundice.   Psych: Alert and cooperative, normal mood and affect.   Labs: CMP     Component Value Date/Time   NA 137 04/25/2019 1402   K 4.1 04/25/2019 1402   CL 107 (H) 04/25/2019 1402   CO2 19 (L) 04/25/2019 1402   GLUCOSE 85 04/25/2019 1402   GLUCOSE 91 06/16/2018 0829   BUN 12 04/25/2019 1402   CREATININE 0.75 (L) 04/25/2019 1402   CALCIUM 8.6 (L) 04/25/2019 1402   PROT 6.4 04/25/2019 1402   ALBUMIN 3.6 (L) 04/25/2019 1402   AST 61 (H) 04/25/2019 1402   ALT 42 04/25/2019 1402   ALKPHOS 589 (H) 04/25/2019 1402   BILITOT 1.1 04/25/2019 1402   GFRNONAA 103 04/25/2019 1402   GFRAA 119 04/25/2019 1402   Lab Results  Component Value Date   WBC 5.3 04/25/2019   HGB 9.5 (L) 04/25/2019   HCT 28.9 (L) 04/25/2019   MCV 91 04/25/2019   PLT 141 (L) 04/25/2019    Imaging Studies: No results found.  Assessment and Plan:   Edwin West is a 59 y.o. y/o male with alcoholic liver cirrhosis here for follow-up  Patient has not followed up with 60 in almost a year He will need up-to-date labs at this time  We will also schedule for ultrasound paracentesis We will also need right upper quadrant ultrasound for [facility] screening  Continue abstinence  Continue Lasix and spironolactone at current dose for now, and we may change dosage after labs and paracentesis  Continue close follow-up in clinic  Dr [facility]

## 2020-04-09 NOTE — Patient Instructions (Addendum)
Please do not eat or drink anything after midnight the night before your ultrasound. If this date does not work for you, please call [phone].  Please discontinue taking Zantac.

## 2020-04-10 ENCOUNTER — Telehealth: Payer: Self-pay

## 2020-04-10 LAB — CBC
Hematocrit: 31.5 % — ABNORMAL LOW (ref 37.5–51.0)
Hemoglobin: 11.5 g/dL — ABNORMAL LOW (ref 13.0–17.7)
MCH: 37.6 pg — ABNORMAL HIGH (ref 26.6–33.0)
MCHC: 36.5 g/dL — ABNORMAL HIGH (ref 31.5–35.7)
MCV: 103 fL — ABNORMAL HIGH (ref 79–97)
Platelets: 94 10*3/uL — CL (ref 150–450)
RBC: 3.06 x10E6/uL — ABNORMAL LOW (ref 4.14–5.80)
RDW: 13.3 % (ref 11.6–15.4)
WBC: 4.8 10*3/uL (ref 3.4–10.8)

## 2020-04-10 LAB — COMPREHENSIVE METABOLIC PANEL
ALT: 39 IU/L (ref 0–44)
AST: 57 IU/L — ABNORMAL HIGH (ref 0–40)
Albumin/Globulin Ratio: 1.3 (ref 1.2–2.2)
Albumin: 3.4 g/dL — ABNORMAL LOW (ref 3.8–4.9)
Alkaline Phosphatase: 519 IU/L — ABNORMAL HIGH (ref 48–121)
BUN/Creatinine Ratio: 14 (ref 9–20)
BUN: 11 mg/dL (ref 6–24)
Bilirubin Total: 1.6 mg/dL — ABNORMAL HIGH (ref 0.0–1.2)
CO2: 20 mmol/L (ref 20–29)
Calcium: 8.2 mg/dL — ABNORMAL LOW (ref 8.7–10.2)
Chloride: 106 mmol/L (ref 96–106)
Creatinine, Ser: 0.79 mg/dL (ref 0.76–1.27)
GFR calc Af Amer: 115 mL/min/{1.73_m2} (ref >59)
GFR calc non Af Amer: 100 mL/min/{1.73_m2} (ref >59)
Globulin, Total: 2.7 g/dL (ref 1.5–4.5)
Glucose: 88 mg/dL (ref 65–99)
Potassium: 4.1 mmol/L (ref 3.5–5.2)
Sodium: 135 mmol/L (ref 134–144)
Total Protein: 6.1 g/dL (ref 6.0–8.5)

## 2020-04-10 LAB — PROTIME-INR
INR: 1 (ref 0.9–1.2)
Prothrombin Time: 10.9 s (ref 9.1–12.0)

## 2020-04-10 NOTE — Telephone Encounter (Signed)
Called patient to let him know about his US paracentesis appointment to [facility] done [facility]15/2020 [facility]. I also told him about his ultrasound and he stated that he was not going to miss two days of work because he could get fired. I told him that I tried to schedule it on the same day but they stated that they weren't able to. However, patient stated that he was not going to have the ultrasound done so to go ahead and cancel it.

## 2020-04-11 ENCOUNTER — Ambulatory Visit: Payer: Commercial Managed Care - PPO

## 2020-04-15 ENCOUNTER — Telehealth: Payer: Self-pay

## 2020-04-15 ENCOUNTER — Ambulatory Visit: Payer: Commercial Managed Care - PPO

## 2020-04-15 DIAGNOSIS — R748 Abnormal levels of other serum enzymes: Secondary | ICD-10-CM

## 2020-04-15 NOTE — Telephone Encounter (Signed)
-----   Message from Pasty Spillers, MD sent at 04/15/2020  1:02 PM EDT ----- Kandis Cocking please let the patient know, his liver enzymes continue to be elevated. I would recommend Liver biopsy, please schedule.  For documentation: MELD Na 8

## 2020-04-15 NOTE — Telephone Encounter (Signed)
Liver biopsy form was faxed and now I'm waiting for date and time.

## 2020-04-15 NOTE — Telephone Encounter (Signed)
Called patient to let him know that his liver enzymes are still elevated and that Dr. Maximino Greenland is recommending a liver biopsy to be done. Patient agreed on doing this but after he has his US paracentesis done since he has to work to make money and pay for his bills. I told him that I understood and that I would call him with a date and time. Patient agreed.

## 2020-04-16 ENCOUNTER — Ambulatory Visit: Payer: Commercial Managed Care - PPO

## 2020-04-22 ENCOUNTER — Ambulatory Visit
Admission: RE | Admit: 2020-04-22 | Discharge: 2020-04-22 | Disposition: A | Discharge status: Home / Self Care | Payer: Commercial Managed Care - PPO | Source: Ambulatory Visit | Attending: Gastroenterology | Admitting: Gastroenterology

## 2020-04-22 ENCOUNTER — Other Ambulatory Visit: Payer: Self-pay

## 2020-04-22 ENCOUNTER — Other Ambulatory Visit: Payer: Self-pay | Admitting: Gastroenterology

## 2020-04-22 DIAGNOSIS — K7031 Alcoholic cirrhosis of liver with ascites: Secondary | ICD-10-CM

## 2020-04-22 NOTE — Progress Notes (Signed)
Request to IR for diagnostic and therapeutic paracentesis from Dr. Maximino Greenland (GI). Per patient he did not have any abdominal distention or concerns but thinks this was ordered due to need for liver biopsy.  US examination of all 4 quadrants shows no peritoneal fluid. No procedure performed today, patient discharged to home in stable condition with instructions to follow up with GI as planned.  Images from today's encounter are available for review under imaging section of Epic.  Lynnette Caffey, PA-C

## 2020-04-30 ENCOUNTER — Telehealth: Payer: Self-pay

## 2020-04-30 ENCOUNTER — Other Ambulatory Visit: Payer: Self-pay | Admitting: Gastroenterology

## 2020-04-30 DIAGNOSIS — K7031 Alcoholic cirrhosis of liver with ascites: Secondary | ICD-10-CM

## 2020-04-30 NOTE — Telephone Encounter (Signed)
Called patient and left him a voicemail letting him know that his liver biopsy is scheduled to be on 05/08/2020 at 9:30 AM and needs to be fasting and will need a driver. I will call him again to make sure that he received the message.

## 2020-05-01 IMAGING — US US PARACENTESIS
1 series · 5 of 5 positions shown · non-contrast
Comparison: none

INDICATION: Patient with history of alcoholic cirrhosis, recurrent ascites.
Request made for diagnostic and therapeutic paracentesis.

[Series 1: us paracentesis · 5 of 5 slices shown]
[im 1/5]
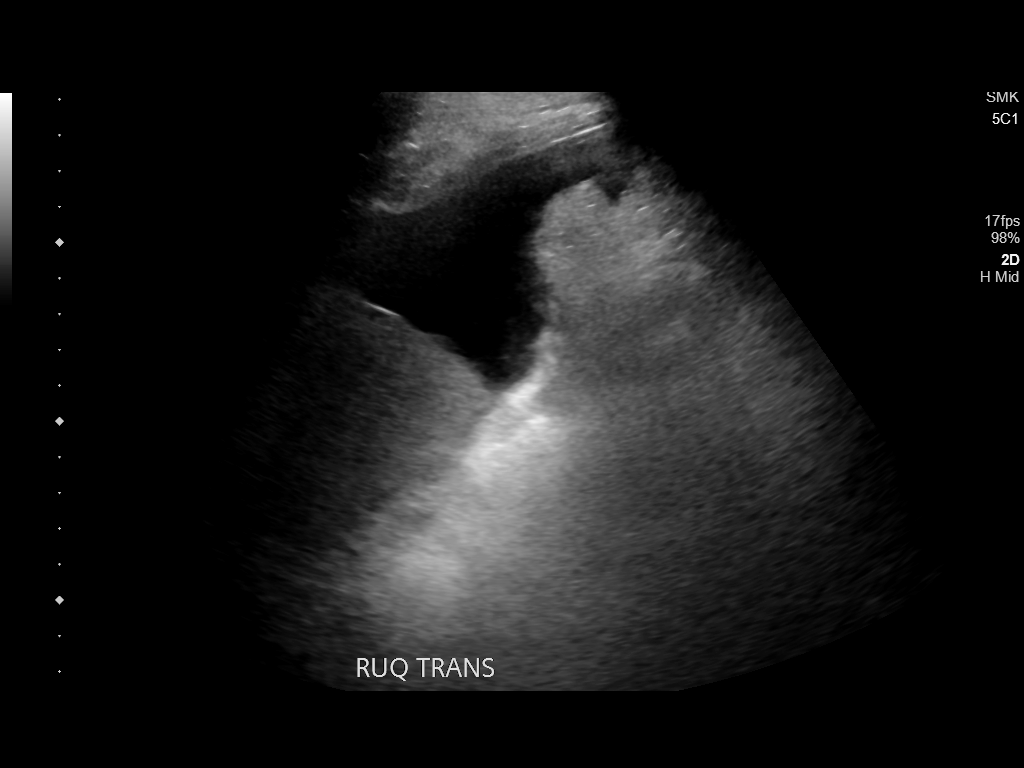
[im 2/5]
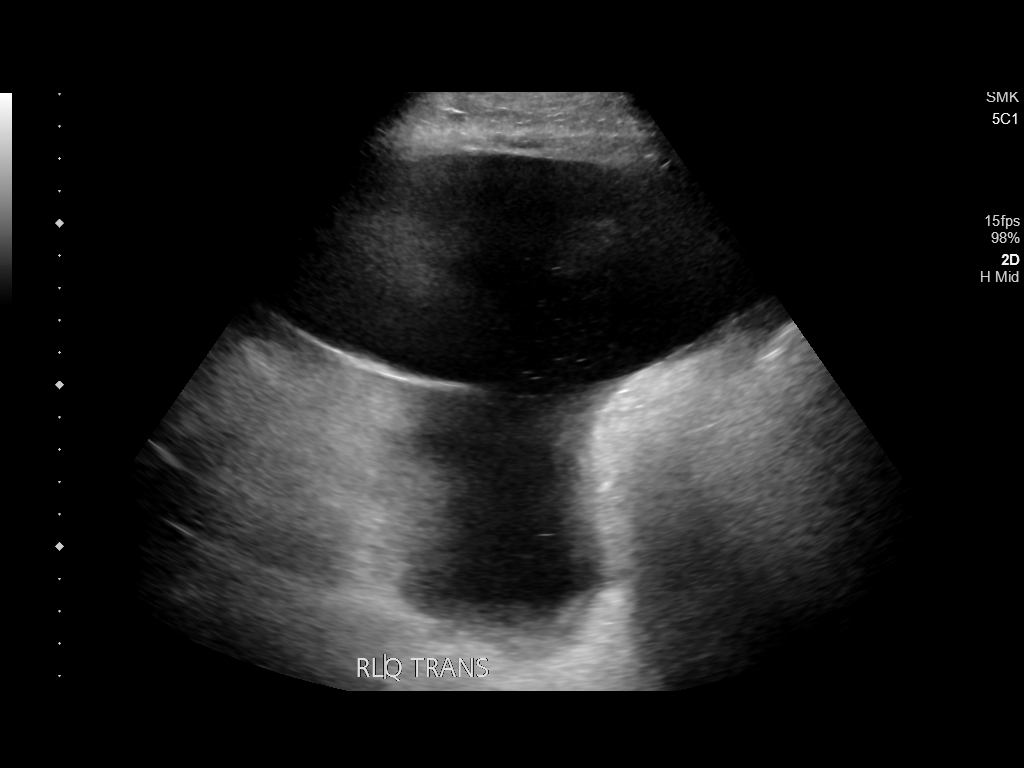
[im 3/5]
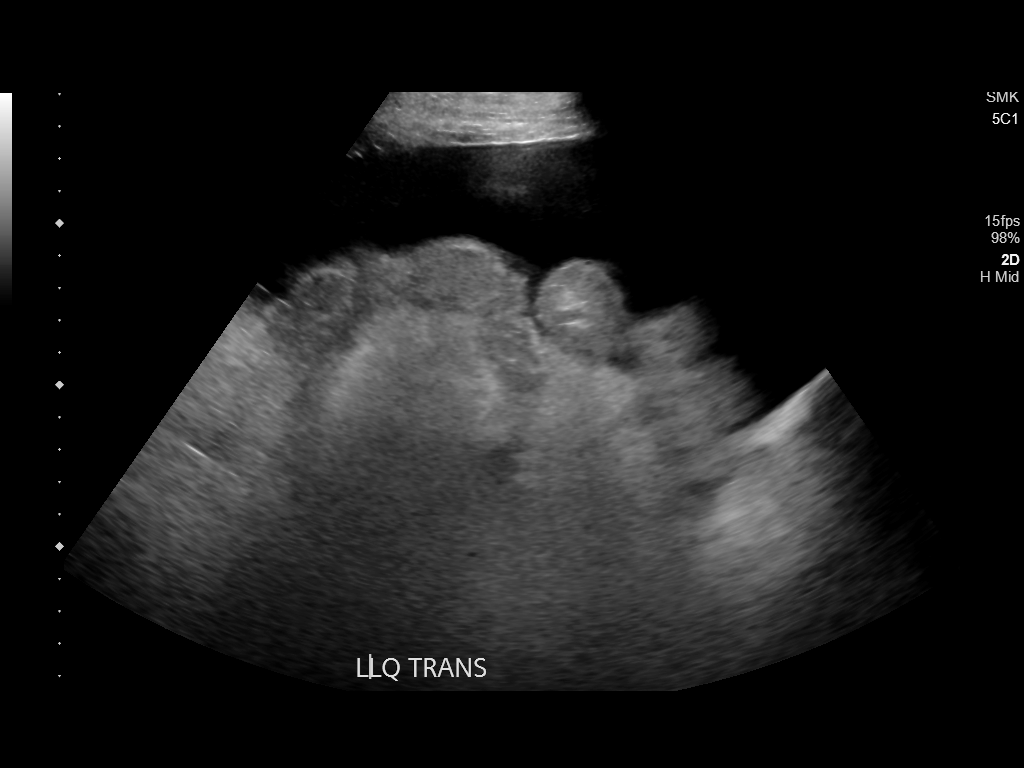
[im 4/5]
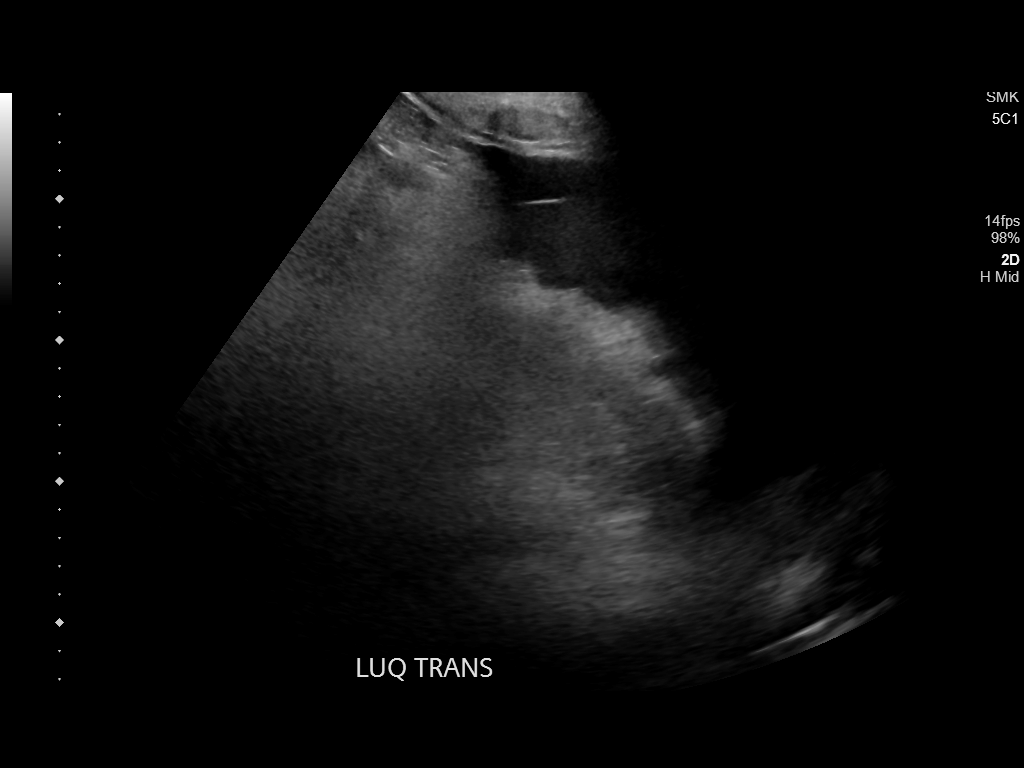
[im 5/5]
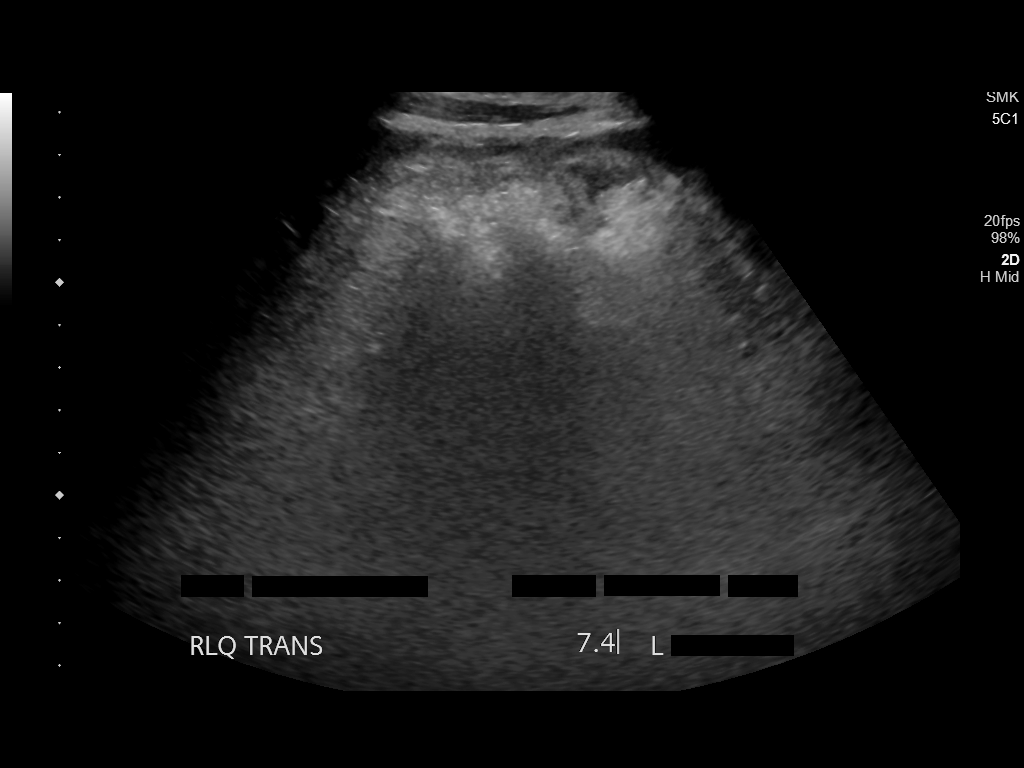

[5 of 5 positions shown; findings below may reference images not displayed]

EXAM:
ULTRASOUND GUIDED DIAGNOSTIC AND THERAPEUTIC PARACENTESIS

MEDICATIONS:
None

COMPLICATIONS:
None immediate.

PROCEDURE:
Informed written consent was obtained from the patient after a
discussion of the risks, benefits and alternatives to treatment. A
timeout was performed prior to the initiation of the procedure.

Initial ultrasound scanning demonstrates a large amount of ascites
within the right lower abdominal quadrant. The right lower abdomen
was prepped and draped in the usual sterile fashion. 1% lidocaine
was used for local anesthesia.

Following this, a 6 Fr Safe-T-Centesis catheter was introduced. An
ultrasound image was saved for documentation purposes. The
paracentesis was performed. The catheter was removed and a dressing
was applied. The patient tolerated the procedure well without
immediate post procedural complication.
FINDINGS: A total of approximately 7.4 liters of yellow fluid was removed.
Samples were sent to the laboratory as requested by the clinical
team.
IMPRESSION: Successful ultrasound-guided diagnostic and therapeutic paracentesis
yielding 7.4 liters of peritoneal fluid. IV albumin will be
administered afterwards.

## 2020-05-06 ENCOUNTER — Telehealth: Payer: Self-pay | Admitting: Gastroenterology

## 2020-05-06 NOTE — Telephone Encounter (Signed)
Called patient and told him that I would have to call specialty scheduling to let them know that he is wanting to reschedule his biopsy. I told him that once I knew when it would be scheduled for, that I would call him and let him know. Patient agreed.

## 2020-05-06 NOTE — Telephone Encounter (Signed)
He lvm that he has to cx his procedure and r/s as he has no transportation. He asked to please call him

## 2020-05-07 ENCOUNTER — Other Ambulatory Visit: Payer: Self-pay | Admitting: Radiology

## 2020-05-07 NOTE — Telephone Encounter (Signed)
Called specialty scheduling and left Britta Mccreedy a voicemail to call me back so we could try to reschedule patient's biopsy. Waiting for her to call me back.

## 2020-05-08 ENCOUNTER — Ambulatory Visit: Admission: RE | Admit: 2020-05-08 | Payer: Commercial Managed Care - PPO | Source: Ambulatory Visit

## 2020-05-08 NOTE — Telephone Encounter (Signed)
Please read telephone note from 05/06/2020.

## 2020-05-13 ENCOUNTER — Ambulatory Visit: Payer: Commercial Managed Care - PPO | Admitting: Gastroenterology

## 2020-05-26 ENCOUNTER — Telehealth: Payer: Self-pay

## 2020-05-26 NOTE — Telephone Encounter (Signed)
Barbara from Specialty scheduling tried to contact the patient to reschedule his biopsy and he will never return her call. Patient has an appointment with Dr. Maximino Greenland on 06/25/2020 and hopefully she could let him know that we have tried several times to reschedule his biopsy with no luck since he always cancels it.

## 2020-05-27 ENCOUNTER — Telehealth: Payer: Self-pay | Admitting: Gastroenterology

## 2020-05-27 DIAGNOSIS — K7031 Alcoholic cirrhosis of liver with ascites: Secondary | ICD-10-CM

## 2020-05-27 MED ORDER — SPIRONOLACTONE 100 MG PO TABS: 100.0000 mg | ORAL_TABLET | Freq: Every day | ORAL | 0 refills | Status: DC

## 2020-05-27 MED ORDER — FUROSEMIDE 40 MG PO TABS: 40.0000 mg | ORAL_TABLET | Freq: Every day | ORAL | 0 refills | Status: DC

## 2020-05-27 NOTE — Telephone Encounter (Signed)
Patient states he is almost out of both medications Dr. Maximino Greenland prescribed him and wants to know if he can get a refill sent to [facility]. Pt has follow up scheduled for 11.17.21.

## 2020-05-27 NOTE — Telephone Encounter (Signed)
Prescriptions were sent and I left a message to the pharmacist to remind patient of his appointment with Dr. Maximino Greenland.

## 2020-06-25 ENCOUNTER — Telehealth: Payer: Self-pay

## 2020-06-25 ENCOUNTER — Telehealth (INDEPENDENT_AMBULATORY_CARE_PROVIDER_SITE_OTHER): Payer: Commercial Managed Care - PPO | Admitting: Gastroenterology

## 2020-06-25 ENCOUNTER — Encounter: Payer: Self-pay | Admitting: Gastroenterology

## 2020-06-25 DIAGNOSIS — K7030 Alcoholic cirrhosis of liver without ascites: Secondary | ICD-10-CM | POA: Diagnosis not present

## 2020-06-25 DIAGNOSIS — R748 Abnormal levels of other serum enzymes: Secondary | ICD-10-CM

## 2020-06-25 NOTE — Telephone Encounter (Signed)
Liver biopsy form was faxed to Specialty scheduling, they will contact me with a date and time. Patient prefers Mondays, Wednesdays and/or Fridays.

## 2020-06-25 NOTE — Addendum Note (Signed)
Addended by: Adela Ports on: 06/25/2020 04:01 PM   Modules accepted: Orders

## 2020-06-25 NOTE — Progress Notes (Signed)
Edwin Bouillon, MD [address]  [address]  [place], [place] [address]  Main: [phone]  Fax: [phone]   Primary Care Physician: Emi Belfast, FNP  Virtual Visit via Telephone Note  I connected with patient on 06/25/20 at  2:45 PM EST by telephone and verified that I am speaking with the correct person using two identifiers.   I discussed the limitations, risks, security and privacy concerns of performing an evaluation and management service by telephone and the availability of in person appointments. I also discussed with the patient that there may be a patient responsible charge related to this service. The patient expressed understanding and agreed to proceed.  Location of Patient: Home Location of Provider: Home Persons involved: Patient and provider only during the visit (nursing staff and front desk staff was involved in communicating with the patient prior to the appointment, reviewing medications and checking them in)   History of Present Illness: Chief Complaint  Patient presents with  . Alcoholic cirrhosis    Patient stated that he had been doing well with no complaints.     HPI: Edwin West is a 59 y.o. male with history of alcoholic liver cirrhosis here for follow-up.  Liver biopsy has been recommended and ordered on multiple occasions in the past and patient has failed to schedule.  He also has had multiple noncompliance issues with multiple no-shows in the past and noncompliance to recommended testing as well.  On last visit, he reported abdominal distention and visible distention was noted and patient was sent for paracentesis.  Ultrasound report could not find enough fluid for paracentesis and it was deferred.  Patient has been taking Lasix and Aldactone with good results.  Does not watch salt labels, but states he is trying.  No episodes of confusion.  No episodes of bleeding.  Most recent meld NA score 8, on April 09, 2020  labs  Previous history: Has had elevated liver enzymes in the past liver biopsy recommended which he has not done despite multiple requests to do so.   Has an obese abdomen otherwise.  Ultrasound liver Doppler shows patent portal, splenic and hepatic veins  Previous liver serology including viral and autoimmune hepatitis work-up has been unrevealing except for mildly elevated F-actin antibody  Was also previously on nadolol which he states he has not been taking.  Was also on Coreg instead of nadolol at one point, that he was not taking either.  No episodes of bleeding or confusion.  However does report some drowsiness.  No nausea or vomiting.  No altered bowel habits, blood in stool, weight loss.  Variceal screening EGD, June 16, 2018, nonbleeding grade 2 esophageal varices. Portal hypertensive gastropathy. Patient has since been on nadolol 20 mg once daily with no side effects.  Screening colonoscopy, June 16, 2018. Fair prep. One 5 mm cecal polyp removed. Repeat recommended in 1 year to with 2-day prep due to fair prep.  Small bowel capsule study with no active bleeding noted.  Nonspecific red spots seen.  1 of the red spots seem to have a small trace of red streak next to it.  This was discussed with patient and family after his report with options of enteroscopy versus conservative management.  They chose conservative management and patient continues to be stable from anemia perspective.  He was referred to hematology and he has not made this appointment yet.  However, his ferritin has improved with oral iron replacement.  Current Outpatient Medications  Medication Sig Dispense Refill  . furosemide (LASIX) 40 MG tablet Take 1 tablet (40 mg total) by mouth daily. 30 tablet 0  . spironolactone (ALDACTONE) 100 MG tablet Take 1 tablet (100 mg total) by mouth daily. 30 tablet 0   No current facility-administered medications for this visit.    Allergies as of 06/25/2020  . (No  Known Allergies)    Review of Systems:    All systems reviewed and negative except where noted in HPI.   Observations/Objective:  Labs: CMP     Component Value Date/Time   NA 135 04/09/2020 1614   K 4.1 04/09/2020 1614   CL 106 04/09/2020 1614   CO2 20 04/09/2020 1614   GLUCOSE 88 04/09/2020 1614   GLUCOSE 91 06/16/2018 0829   BUN 11 04/09/2020 1614   CREATININE 0.79 04/09/2020 1614   CALCIUM 8.2 (L) 04/09/2020 1614   PROT 6.1 04/09/2020 1614   ALBUMIN 3.4 (L) 04/09/2020 1614   AST 57 (H) 04/09/2020 1614   ALT 39 04/09/2020 1614   ALKPHOS 519 (H) 04/09/2020 1614   BILITOT 1.6 (H) 04/09/2020 1614   GFRNONAA 100 04/09/2020 1614   GFRAA 115 04/09/2020 1614   Lab Results  Component Value Date   WBC 4.8 04/09/2020   HGB 11.5 (L) 04/09/2020   HCT 31.5 (L) 04/09/2020   MCV 103 (H) 04/09/2020   PLT 94 (LL) 04/09/2020    Imaging Studies: No results found.  Assessment and Plan:   Edwin West is a 59 y.o. y/o male with alcoholic liver cirrhosis here for follow-up  Assessment and Plan:  Patient remains abstinent of alcohol.  This was encouraged.  Sodium restriction to less than 2000 mg a day discussed  Pattern of liver enzyme elevation with predominantly elevated alkaline phosphatase is not entirely reflective of alcoholic liver disease and therefore liver biopsy is needed  Need for right upper quadrant ultrasound  and liver biopsy discussed again.  Patient states he will schedule.  Our clinic will call them again to schedule this.  Importance of getting these done to guide further treatment plans discussed in detail and he verbalized understanding.  However, he continues to cancel or no-show at his appointments  Importance of proper follow-up discussed again  Follow Up Instructions:    I discussed the assessment and treatment plan with the patient. The patient was provided an opportunity to ask questions and all were answered. The patient agreed with the  plan and demonstrated an understanding of the instructions.   The patient was advised to call back or seek an in-person evaluation if the symptoms worsen or if the condition fails to improve as anticipated.  I provided 15 minutes of non-face-to-face time during this encounter. Additional time was spent in reviewing patient's chart, placing orders etc.   Pasty Spillers, MD  Speech recognition software was used to dictate this note.

## 2020-06-25 NOTE — Telephone Encounter (Signed)
Called patient to let him know that his Ultrasound was scheduled to be on 06/30/2020 at [facility] and he stated that he would have to reschedule it since he can't go on that day. Therefore, I gave him the phone number to call to reschedule. Patient will call us back to let us know as of when his daughter was able to take him and have his liver biopsy.

## 2020-06-30 ENCOUNTER — Ambulatory Visit: Admission: RE | Admit: 2020-06-30 | Payer: Commercial Managed Care - PPO | Source: Ambulatory Visit

## 2020-07-01 NOTE — Telephone Encounter (Signed)
The radiology/ultrasound unit called letting us know that patient did not show up to his ultrasound yesterday. Apparently patient did not call them to reschedule even though I had given him the phone number to call in case he needed to reschedule.

## 2020-07-09 NOTE — Telephone Encounter (Signed)
Called patient again and he stated that he had not spoken to his daughter about taking him to the have his liver biopsy done. I asked him if he was going to reschedule his ultrasound and he stated that he would try but that he had to work and can't miss any days.

## 2020-07-11 NOTE — Telephone Encounter (Signed)
Called patient again but had to leave a voicemail to call me whenever he wanted me to schedule his ultrasound and liver biopsy. Awaiting on his response.

## 2020-08-20 ENCOUNTER — Other Ambulatory Visit: Payer: Self-pay

## 2020-08-20 ENCOUNTER — Encounter: Payer: Self-pay | Admitting: Family Medicine

## 2020-08-20 ENCOUNTER — Ambulatory Visit: Payer: Commercial Managed Care - PPO | Admitting: Family Medicine

## 2020-08-20 ENCOUNTER — Ambulatory Visit (INDEPENDENT_AMBULATORY_CARE_PROVIDER_SITE_OTHER)
Admission: RE | Admit: 2020-08-20 | Discharge: 2020-08-20 | Disposition: A | Discharge status: Home / Self Care | Payer: Commercial Managed Care - PPO | Source: Ambulatory Visit | Attending: Family Medicine | Admitting: Family Medicine

## 2020-08-20 VITALS — BP 110/60 | HR 61 | Temp 97.9°F | Ht 69.0 in | Wt 207.8 lb

## 2020-08-20 DIAGNOSIS — M5416 Radiculopathy, lumbar region: Secondary | ICD-10-CM | POA: Diagnosis not present

## 2020-08-20 MED ORDER — PREDNISONE 20 MG PO TABS: ORAL_TABLET | ORAL | 0 refills | Status: DC

## 2020-08-20 NOTE — Progress Notes (Signed)
Williemae Muriel T. Zakariyya Helfman, MD, CAQ Sports Medicine  Primary Care and Sports Medicine [facility] at [facility] [address] [place], [address]  Phone: [phone]  FAX: [phone]  Edwin West - 60 y.o. male  MRN [id number]  Date of Birth: 1961-05-08  Date: 08/20/2020  PCP: Edwin Belfast, FNP (Inactive)  Referral: Edwin Belfast, FNP  Chief Complaint  Patient presents with  . Back Pain    Center of back and radiates to the right leg all the way down.    This visit occurred during the SARS-CoV-2 public health emergency.  Safety protocols were in place, including screening questions prior to the visit, additional usage of staff PPE, and extensive cleaning of exam room while observing appropriate contact time as indicated for disinfecting solutions.   Subjective:   Edwin West is a 60 y.o. very pleasant male patient who presents with the following: Back Pain  R posterior pelvis all the way down to the R leg all the way.  With walking he will have some significant acute back pain and radiculopathy.  Has been a while back, was a straight ladder.  ? About ladder from work.  Fell down some.  But this was on the order of a few years ago.  Has been taking a lot of ibuprofen.  6-8 OTC motrin at a time daily.  No surg history. Did do some PT after his work accident.  Radicular pain would come and go.  R upper pelvis and piriformis is TTP  ongoing for approximately: 3 mo. The patient has had back pain before. The back pain is localized into the lumbar spine area. They also describe + radiculopathy on the R  No numbness or tingling. No bowel or bladder incontinence. No focal weakness. Prior interventions: none Physical therapy: No Chiropractic manipulations: No Acupuncture: No Osteopathic manipulation: No Heat or cold: Minimal effect   Review of Systems is noted in the HPI, as appropriate  Objective:   Blood pressure  110/60, pulse 61, temperature 97.9 F (36.6 C), temperature source Temporal, height 5\' 9"  (1.753 m), weight 207 lb 12.8 oz (94.3 kg), SpO2 95 %.  GEN: No acute distress; alert,appropriate. PULM: Breathing comfortably in no respiratory distress PSYCH: Normally interactive.   Range of motion at  the waist: Flexion, rotation and lateral bending: Full, he does have some pain with extension  No echymosis or edema Rises to examination table with no difficulty Gait: minimally antalgic  Inspection/Deformity: No abnormality Paraspinus T: Mild tenderness L4-S1 bilaterally  B Ankle Dorsiflexion (L5,4): 5/5 B Great Toe Dorsiflexion (L5,4): 5/5 Heel Walk (L5): WNL Toe Walk (S1): WNL Rise/Squat (L4): WNL, mild pain  SENSORY B Medial Foot (L4): WNL B Dorsum (L5): WNL B Lateral (S1): WNL Light Touch: WNL Pinprick: WNL  REFLEXES Knee (L4): 2+ Ankle (S1): 2+  B SLR, seated: neg B SLR, supine: neg B FABER: This does cause some pain on the right B Reverse FABER: This does cause some pain on the right B Greater Troch: NT B Log Roll: neg B Stork: NT B Sciatic Notch: NT  Radiology: No results found.   Assessment and Plan:     ICD-10-CM   1. Lumbar radiculopathy, acute  M54.16 DG Lumbar Spine Complete   Reviewed rehab and range of motion along with [facility] program.  Acute on chronic back pain.  Most likely disc/foraminal encroachment, degenerative changes may also be playing a role.  Pulse with steroids, range of motion, check  plain films.  He also has some intermittent FMLA paperwork, and rather than being on a continuous FMLA, some occasional intermittent FMLA would be reasonable in this case.  Follow-up: If no improvement 6 weeks  Meds ordered this encounter  Medications  . predniSONE (DELTASONE) 20 MG tablet    Sig: 2 tabs po for 7 days, then 1 tab po for 7 days    Dispense:  21 tablet    Refill:  0   There are no discontinued medications. Orders Placed This  Encounter  Procedures  . DG Lumbar Spine Complete    Signed,  Zeta Bucy T. Burt Piatek, MD   Outpatient Encounter Medications as of 08/20/2020  Medication Sig  . furosemide (LASIX) 40 MG tablet Take 1 tablet (40 mg total) by mouth daily.  . predniSONE (DELTASONE) 20 MG tablet 2 tabs po for 7 days, then 1 tab po for 7 days  . spironolactone (ALDACTONE) 100 MG tablet Take 1 tablet (100 mg total) by mouth daily.   No facility-administered encounter medications on file as of 08/20/2020.

## 2020-08-22 ENCOUNTER — Telehealth: Payer: Self-pay | Admitting: Family Medicine

## 2020-08-22 NOTE — Telephone Encounter (Signed)
Called patient in regards to [facility] paperwork received. LVM to call back to discuss.

## 2020-08-26 ENCOUNTER — Other Ambulatory Visit: Payer: Self-pay | Admitting: Gastroenterology

## 2020-08-26 DIAGNOSIS — K7031 Alcoholic cirrhosis of liver with ascites: Secondary | ICD-10-CM

## 2020-09-03 NOTE — Telephone Encounter (Signed)
Attempted to call patient again in regards to paperwork. Unable to connect. Please advise for FMLA. Placed in inbox.

## 2020-09-03 NOTE — Telephone Encounter (Signed)
Will review in office.

## 2020-09-10 ENCOUNTER — Encounter: Payer: Self-pay | Admitting: Family Medicine

## 2020-09-10 ENCOUNTER — Ambulatory Visit: Payer: Commercial Managed Care - PPO | Admitting: Family Medicine

## 2020-09-10 ENCOUNTER — Other Ambulatory Visit: Payer: Self-pay

## 2020-09-10 VITALS — BP 118/58 | HR 88 | Temp 98.7°F | Ht 69.0 in | Wt 208.0 lb

## 2020-09-10 DIAGNOSIS — F1011 Alcohol abuse, in remission: Secondary | ICD-10-CM | POA: Diagnosis not present

## 2020-09-10 DIAGNOSIS — J439 Emphysema, unspecified: Secondary | ICD-10-CM | POA: Diagnosis not present

## 2020-09-10 DIAGNOSIS — Z720 Tobacco use: Secondary | ICD-10-CM

## 2020-09-10 DIAGNOSIS — K7031 Alcoholic cirrhosis of liver with ascites: Secondary | ICD-10-CM | POA: Diagnosis not present

## 2020-09-10 DIAGNOSIS — K621 Rectal polyp: Secondary | ICD-10-CM | POA: Diagnosis not present

## 2020-09-10 MED ORDER — ALBUTEROL SULFATE HFA 108 (90 BASE) MCG/ACT IN AERS: 2.0000 | INHALATION_SPRAY | Freq: Four times a day (QID) | RESPIRATORY_TRACT | 2 refills | Status: DC | PRN

## 2020-09-10 NOTE — Assessment & Plan Note (Signed)
Last drink 2019. Encouraged continued cessation

## 2020-09-10 NOTE — Assessment & Plan Note (Signed)
Eligible for lung cancer screening - he will consider and call for referral. Encouraged smoking cessation, he will work on reducing tobacco.

## 2020-09-10 NOTE — Assessment & Plan Note (Signed)
Reviewed prior colonoscopy 2019 and recommendation for 1 year f/u due to prep. Advised GI follow-up, pt will call.

## 2020-09-10 NOTE — Assessment & Plan Note (Signed)
Reviewed chart. Pt did not get biopsy. Notes he is only taking 1/2 dose of spironolactone and lasix. Does appear to have some fluid in abdomen - but unclear if this is baseline for him. Advised taking full dose Spironolactone 100 mg and furosemide 40 mg daily. And advised f/u with GI ASAP. He said he would call.

## 2020-09-10 NOTE — Assessment & Plan Note (Signed)
Wheezes on exam. Trial of albuterol inhaler prn to see if that improves his SOB. Discussed that liver disease and fluid could cause SOB as well.

## 2020-09-10 NOTE — Patient Instructions (Addendum)
#  Emphysema - can try Albuterol inhaler - make sure the pharmacists explains how to use this - use if wheezing or coughing or short of breath - if working and needing often we may need another inhaler   MyChart or Call if you want to do lung cancer screening   Call your GI doctor today to schedule a follow-up visit  -- Also ask for the phone number to schedule the ultrasound and biopsy

## 2020-09-10 NOTE — Progress Notes (Signed)
Subjective:     Edwin West is a 60 y.o. male presenting for Transitions Of Care     HPI  #Alcoholic cirrhosis - recently no showed an US/Biopsy  - ascites is stable  #emphysema - notes some sob and wheezing - has never used an inhaler   Review of Systems   Social History   Tobacco Use  Smoking Status Current Every Day Smoker  . Packs/day: 0.50  . Years: 43.00  . Pack years: 21.50  . Types: Cigarettes  Smokeless Tobacco Former Neurosurgeon  . Types: Chew  Tobacco Comment   previously smoked more than 1/2 ppd        Objective:    BP Readings from Last 3 Encounters:  09/10/20 (!) 118/58  08/20/20 110/60  04/09/20 126/69   Wt Readings from Last 3 Encounters:  09/10/20 208 lb (94.3 kg)  08/20/20 207 lb 12.8 oz (94.3 kg)  04/09/20 205 lb (93 kg)    BP (!) 118/58   Pulse 88   Temp 98.7 F (37.1 C) (Temporal)   Ht 5\' 9"  (1.753 m)   Wt 208 lb (94.3 kg)   SpO2 97%   BMI 30.72 kg/m    Physical Exam Constitutional:      Appearance: Normal appearance. He is not ill-appearing or diaphoretic.  HENT:     Right Ear: External ear normal.     Left Ear: External ear normal.  Eyes:     General: No scleral icterus.    Extraocular Movements: Extraocular movements intact.     Conjunctiva/sclera: Conjunctivae normal.  Cardiovascular:     Rate and Rhythm: Normal rate and regular rhythm.     Heart sounds: Murmur heard.    Pulmonary:     Effort: Pulmonary effort is normal. No respiratory distress.     Breath sounds: Wheezing present.  Abdominal:     General: Abdomen is flat.     Palpations: Abdomen is soft.     Tenderness: There is no abdominal tenderness.     Hernia: A hernia is present.     Comments: Fluid wave  Musculoskeletal:     Cervical back: Neck supple.  Skin:    General: Skin is warm and dry.  Neurological:     Mental Status: He is alert. Mental status is at baseline.  Psychiatric:        Mood and Affect: Mood normal.            Assessment & Plan:   Problem List Items Addressed This Visit      Respiratory   Emphysema lung (HCC)    Wheezes on exam. Trial of albuterol inhaler prn to see if that improves his SOB. Discussed that liver disease and fluid could cause SOB as well.       Relevant Medications   albuterol (VENTOLIN HFA) 108 (90 Base) MCG/ACT inhaler     Digestive   Alcoholic cirrhosis of liver with ascites (HCC) - Primary    Reviewed chart. Pt did not get biopsy. Notes he is only taking 1/2 dose of spironolactone and lasix. Does appear to have some fluid in abdomen - but unclear if this is baseline for him. Advised taking full dose Spironolactone 100 mg and furosemide 40 mg daily. And advised f/u with GI ASAP. He said he would call.       Rectal polyp    Reviewed prior colonoscopy 2019 and recommendation for 1 year f/u due to prep. Advised GI follow-up, pt will call.  Other   Alcohol abuse, in remission    Last drink 2019. Encouraged continued cessation      Tobacco abuse    Eligible for lung cancer screening - he will consider and call for referral. Encouraged smoking cessation, he will work on reducing tobacco.           Return in about 2 months (around 11/08/2020) for breathing.  Lynnda Child, MD  This visit occurred during the SARS-CoV-2 public health emergency.  Safety protocols were in place, including screening questions prior to the visit, additional usage of staff PPE, and extensive cleaning of exam room while observing appropriate contact time as indicated for disinfecting solutions.

## 2020-09-17 ENCOUNTER — Other Ambulatory Visit: Payer: Self-pay | Admitting: Family Medicine

## 2020-09-17 DIAGNOSIS — J439 Emphysema, unspecified: Secondary | ICD-10-CM

## 2020-09-17 NOTE — Telephone Encounter (Signed)
Pharmacy requests refill on: Albuterol 108 mcg/act inhaler   LAST REFILL: 09/10/2020 LAST OV: 09/10/2020 NEXT OV: 11/10/2020 PHARMACY: [id number] [place], [place]

## 2020-09-18 ENCOUNTER — Encounter: Payer: Self-pay | Admitting: Gastroenterology

## 2020-09-18 ENCOUNTER — Ambulatory Visit: Payer: Commercial Managed Care - PPO | Admitting: Gastroenterology

## 2020-09-18 ENCOUNTER — Other Ambulatory Visit: Payer: Self-pay

## 2020-09-18 VITALS — BP 120/67 | HR 81 | Temp 98.1°F | Wt 217.0 lb

## 2020-09-18 DIAGNOSIS — R748 Abnormal levels of other serum enzymes: Secondary | ICD-10-CM | POA: Diagnosis not present

## 2020-09-18 DIAGNOSIS — K7031 Alcoholic cirrhosis of liver with ascites: Secondary | ICD-10-CM | POA: Diagnosis not present

## 2020-09-18 MED ORDER — SPIRONOLACTONE 100 MG PO TABS: 100.0000 mg | ORAL_TABLET | Freq: Two times a day (BID) | ORAL | 0 refills | Status: DC

## 2020-09-18 MED ORDER — FUROSEMIDE 40 MG PO TABS: 40.0000 mg | ORAL_TABLET | Freq: Every day | ORAL | 0 refills | Status: DC

## 2020-09-18 NOTE — Patient Instructions (Addendum)
Please call [phone] with Mrs. Edwin West to schedule your paracentesis.  Please call our office at [phone] to schedule your Endoscopy and Colonoscopy for your cirrhosis.  Please call [phone] to schedule your ultrasound of your abdomen.

## 2020-09-19 LAB — COMPREHENSIVE METABOLIC PANEL
ALT: 53 IU/L — ABNORMAL HIGH (ref 0–44)
AST: 70 IU/L — ABNORMAL HIGH (ref 0–40)
Albumin/Globulin Ratio: 1.1 — ABNORMAL LOW (ref 1.2–2.2)
Albumin: 3.2 g/dL — ABNORMAL LOW (ref 3.8–4.9)
Alkaline Phosphatase: 556 IU/L — ABNORMAL HIGH (ref 44–121)
BUN/Creatinine Ratio: 17 (ref 9–20)
BUN: 12 mg/dL (ref 6–24)
Bilirubin Total: 1.7 mg/dL — ABNORMAL HIGH (ref 0.0–1.2)
CO2: 21 mmol/L (ref 20–29)
Calcium: 8.2 mg/dL — ABNORMAL LOW (ref 8.7–10.2)
Chloride: 107 mmol/L — ABNORMAL HIGH (ref 96–106)
Creatinine, Ser: 0.7 mg/dL — ABNORMAL LOW (ref 0.76–1.27)
GFR calc Af Amer: 121 mL/min/{1.73_m2} (ref >59)
GFR calc non Af Amer: 105 mL/min/{1.73_m2} (ref >59)
Globulin, Total: 2.8 g/dL (ref 1.5–4.5)
Glucose: 91 mg/dL (ref 65–99)
Potassium: 4.4 mmol/L (ref 3.5–5.2)
Sodium: 139 mmol/L (ref 134–144)
Total Protein: 6 g/dL (ref 6.0–8.5)

## 2020-09-19 LAB — PROTIME-INR
INR: 1 (ref 0.9–1.2)
Prothrombin Time: 10.9 s (ref 9.1–12.0)

## 2020-09-19 NOTE — Progress Notes (Signed)
Vonda Antigua, MD [address]  [place]  [place], [place] [address]  Main: [phone]  Fax: [phone]   Primary Care Physician: Lesleigh Noe, MD   Chief Complaint  Patient presents with  . alcoholic cirrhosis    HPI: Edwin West is a 60 y.o. male here for follow-up of alcoholic cirrhosis.  Patient remains abstinent of alcohol.  Has continued to be noncompliant with recommendations, with several attempts to obtain liver biopsies for persistently elevated alk phos and liver enzymes.  This has been scheduled multiple times and patient continues to be a no-show for his appointment.  He denies any episodes of confusion, bleeding, diarrhea, dysphagia.  Reports some lower extremity edema after standing up at work, but not otherwise.  Current Outpatient Medications  Medication Sig Dispense Refill  . albuterol (VENTOLIN HFA) 108 (90 Base) MCG/ACT inhaler Inhale 2 puffs into the lungs every 6 (six) hours as needed for wheezing or shortness of breath. 8 g 2  . furosemide (LASIX) 40 MG tablet Take 1 tablet (40 mg total) by mouth daily. 90 tablet 0  . spironolactone (ALDACTONE) 100 MG tablet Take 1 tablet (100 mg total) by mouth 2 (two) times daily. 180 tablet 0   No current facility-administered medications for this visit.    Allergies as of 09/18/2020  . (No Known Allergies)    ROS:  General: Negative for anorexia, weight loss, fever, chills, fatigue, weakness. ENT: Negative for hoarseness, difficulty swallowing , nasal congestion. CV: Negative for chest pain, angina, palpitations, dyspnea on exertion, peripheral edema.  Respiratory: Negative for dyspnea at rest, dyspnea on exertion, cough, sputum, wheezing.  GI: See history of present illness. GU:  Negative for dysuria, hematuria, urinary incontinence, urinary frequency, nocturnal urination.  Endo: Negative for unusual weight change.    Physical Examination:   BP 120/67   Pulse 81   Temp 98.1 F  (36.7 C) (Oral)   Wt 217 lb (98.4 kg)   BMI 32.05 kg/m   General: Well-nourished, well-developed in no acute distress.  Eyes: No icterus. Conjunctivae pink. Mouth: Oropharyngeal mucosa moist and pink , no lesions erythema or exudate. Neck: Supple, Trachea midline Abdomen: Bowel sounds are normal, nontender, obese abdomen, distended, no hepatosplenomegaly or masses, no abdominal bruits or hernia , no rebound or guarding.   Extremities: 1+ extremity edema. No clubbing or deformities. Neuro: Alert and oriented x 3.  Grossly intact. Skin: Warm and dry, no jaundice.   Psych: Alert and cooperative, normal mood and affect.   Labs:  Lab Results  Component Value Date   WBC 4.8 04/09/2020   HGB 11.5 (L) 04/09/2020   HCT 31.5 (L) 04/09/2020   MCV 103 (H) 04/09/2020   PLT 94 (LL) 04/09/2020    Imaging Studies: No results found.  Assessment and Plan:   Edwin West is a 60 y.o. y/o male here for follow-up of alcoholic liver cirrhosis  Due to presence of abdominal distention, will order diagnostic and therapeutic paracentesis Increase spironolactone to 200 mg a day, continue Lasix 40 mg daily  Repeat labs today  Obtain right upper quadrant ultrasound  Need for liver biopsy discussed again.  Patient states he will talk to his daughter about her schedule to bring him for the biopsy and thus does not want to schedule at this time and states will call us back after speaking with his daughter.  The importance of getting this scheduled was discussed again  Patient also need an EGD and colonoscopy, EGD  for variceal surveillance, colonoscopy due to poor prep previously.  The importance of getting this done also discussed as it is past overdue.  Patient verbalized understanding and does not want to schedule but would rather call back after discussing with his daughter  Patient was previously on beta-blockers and was noncompliant   Dr Vonda Antigua

## 2020-11-05 ENCOUNTER — Encounter: Payer: Self-pay | Admitting: Family Medicine

## 2020-11-05 ENCOUNTER — Other Ambulatory Visit: Payer: Self-pay

## 2020-11-05 ENCOUNTER — Ambulatory Visit: Payer: Commercial Managed Care - PPO | Admitting: Family Medicine

## 2020-11-05 VITALS — BP 90/60 | HR 78 | Temp 98.4°F | Ht 69.0 in | Wt 207.8 lb

## 2020-11-05 DIAGNOSIS — M5416 Radiculopathy, lumbar region: Secondary | ICD-10-CM

## 2020-11-05 DIAGNOSIS — K7031 Alcoholic cirrhosis of liver with ascites: Secondary | ICD-10-CM

## 2020-11-05 MED ORDER — PREDNISONE 20 MG PO TABS: ORAL_TABLET | ORAL | 0 refills | Status: DC

## 2020-11-05 NOTE — Progress Notes (Signed)
Adrain Butrick T. Kierstin January, MD, CAQ Sports Medicine  Primary Care and Sports Medicine [facility] [address] [place], [address]  Phone: [phone]  FAX: [phone]  Minor Iden - 60 y.o. male  MRN [id number]  Date of Birth: 10/02/1960  Date: 11/05/2020  PCP: Lynnda Child, MD  Referral: Lynnda Child, MD  Chief Complaint  Patient presents with  . Back Pain    This visit occurred during the SARS-CoV-2 public health emergency.  Safety protocols were in place, including screening questions prior to the visit, additional usage of staff PPE, and extensive cleaning of exam room while observing appropriate contact time as indicated for disinfecting solutions.   Subjective:   Edwin West is a 60 y.o. very pleasant male patient with Body mass index is 30.68 kg/m. who presents with the following:  F/u LBP with radiculopathy: He is really having a lot of pain, and his back pain and radicular symptoms are bothering him on a day-to-day basis, almost throughout much of the day.  He has to lift a lot at work, often 50 pounds plus during the workday.  He is hindered right now.  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 injury, and he did ultimately have to do some PT.  He wonders if this might relate to that.  Has to lift a lot at work. Does lift heavy weight.   He does have a history of some significant alcoholic cirrhosis as well.  He does tell me that he was taking a lot of NSAIDs in recent time.  Review of Systems is noted in the HPI, as appropriate   Objective:   BP 90/60   Pulse 78   Temp 98.4 F (36.9 C) (Temporal)   Ht 5\' 9"  (1.753 m)   Wt 207 lb 12 oz (94.2 kg)   SpO2 97%   BMI 30.68 kg/m    Range of motion at  the waist: Flexion, extension, lateral bending and rotation: He is only able to flex to approximately 45 degrees.  Pain with extension.  Pain with lateral bending and rotation, but significantly better than  flexion and extension.  No echymosis or edema Rises to examination table with mild to moderate difficulty Gait: He walks with significant antalgia  Inspection/Deformity: N Paraspinus Tenderness: Diffuse, L2-S1 bilaterally  B Ankle Dorsiflexion (L5,4): 5/5 B Great Toe Dorsiflexion (L5,4): 5/5 Heel Walk (L5): WNL Toe Walk (S1): WNL Rise/Squat (L4): WNL, mild pain  SENSORY B Medial Foot (L4): WNL B Dorsum (L5): WNL B Lateral (S1): WNL Light Touch: WNL Pinprick: WNL  REFLEXES Knee (L4): 2+ Ankle (S1): 2+  B SLR, seated: neg B FABER: Impossible due to pain B Greater Troch: NT B Log Roll: neg B Sciatic Notch: Tender to palpation  Radiology: DG Lumbar Spine Complete  Result Date: 08/20/2020 CLINICAL DATA:  Lumbar radiculopathy for 3 months EXAM: LUMBAR SPINE - COMPLETE 4+ VIEW COMPARISON:  Chest radiographs 04/03/2018 FINDINGS: 6 non-rib-bearing lumbar type vertebra. Scattered disc space narrowing and minimal endplate spur formation. Minimal chronic anterior height loss of L1, unchanged since 2019, potentially developmental. No acute fracture, subluxation, or bone destruction. No definite spondylolysis. SI joints preserved. IMPRESSION: Mild scattered degenerative disc disease changes lumbar spine. No acute abnormalities. Electronically Signed   By: 2020 M.D.   On: 08/20/2020 13:01   Assessment and Plan:     ICD-10-CM   1. Lumbar radiculopathy, acute  M54.16   2. Alcoholic cirrhosis of  liver with ascites (HCC)  K70.31    This is a challenging case in general.  He is got some significant spine pathology with some relatively severe radiculopathy and pain.  His PT and iron are normal.  I think that he would be a poor operative candidate.  I did review this chart from a GI standpoint, and he does have some significant cirrhosis with fairly dramatically elevated liver enzymes and alkaline phosphatase.  On chart review, it seems as if he has been somewhat noncompliant in the  past.  I am hesitant to give him any NSAIDs, and I would avoid Tylenol.  Pain medication is a challenge, but non-Tylenol low-grade pain medication might be appropriate.  I think that it is more appropriate to ask his GI doctor about this, since I minimally take care of cirrhotic patients.  Meds ordered this encounter  Medications  . predniSONE (DELTASONE) 20 MG tablet    Sig: 2 tabs po for 7 days, then 1 tab po for 7 days    Dispense:  21 tablet    Refill:  0    Signed,  Edwin Gaymon T. Royston Bekele, MD   Outpatient Encounter Medications as of 11/05/2020  Medication Sig  . albuterol (VENTOLIN HFA) 108 (90 Base) MCG/ACT inhaler Inhale 2 puffs into the lungs every 6 (six) hours as needed for wheezing or shortness of breath.  . furosemide (LASIX) 40 MG tablet Take 1 tablet (40 mg total) by mouth daily.  . predniSONE (DELTASONE) 20 MG tablet 2 tabs po for 7 days, then 1 tab po for 7 days  . spironolactone (ALDACTONE) 100 MG tablet Take 1 tablet (100 mg total) by mouth 2 (two) times daily.   No facility-administered encounter medications on file as of 11/05/2020.

## 2020-11-10 ENCOUNTER — Encounter: Payer: Self-pay | Admitting: Family Medicine

## 2020-11-10 ENCOUNTER — Other Ambulatory Visit: Payer: Self-pay

## 2020-11-10 ENCOUNTER — Ambulatory Visit: Payer: Commercial Managed Care - PPO | Admitting: Family Medicine

## 2020-11-10 VITALS — BP 100/58 | HR 78 | Temp 97.6°F | Ht 69.0 in | Wt 207.0 lb

## 2020-11-10 DIAGNOSIS — M5416 Radiculopathy, lumbar region: Secondary | ICD-10-CM | POA: Diagnosis not present

## 2020-11-10 DIAGNOSIS — K7031 Alcoholic cirrhosis of liver with ascites: Secondary | ICD-10-CM

## 2020-11-10 DIAGNOSIS — J439 Emphysema, unspecified: Secondary | ICD-10-CM | POA: Diagnosis not present

## 2020-11-10 MED ORDER — FLUTICASONE-SALMETEROL 250-50 MCG/DOSE IN AEPB: 1.0000 | INHALATION_SPRAY | Freq: Two times a day (BID) | RESPIRATORY_TRACT | 3 refills | Status: AC

## 2020-11-10 NOTE — Progress Notes (Signed)
Subjective:     Edwin West is a 60 y.o. male presenting for Back Pain (Seen by copland no change in symptoms. )     HPI   #SOB - using albuterol every day - this does help  - has been using the albuterol 1-2 times daily  #Back pain - saw Dr. Patsy Lager - was prescribed prednisone - is improving with this medication - worried that the pain is going to return as soon as it stops - 2 week course     Review of Systems   Social History   Tobacco Use  Smoking Status Current Every Day Smoker  . Packs/day: 0.50  . Years: 43.00  . Pack years: 21.50  . Types: Cigarettes  Smokeless Tobacco Former Neurosurgeon  . Types: Chew  Tobacco Comment   previously smoked more than 1/2 ppd        Objective:    BP Readings from Last 3 Encounters:  11/10/20 (!) 100/58  11/05/20 90/60  09/18/20 120/67   Wt Readings from Last 3 Encounters:  11/10/20 207 lb (93.9 kg)  11/05/20 207 lb 12 oz (94.2 kg)  09/18/20 217 lb (98.4 kg)    BP (!) 100/58   Pulse 78   Temp 97.6 F (36.4 C) (Temporal)   Ht 5\' 9"  (1.753 m)   Wt 207 lb (93.9 kg)   SpO2 96%   BMI 30.57 kg/m    Physical Exam Constitutional:      Appearance: Normal appearance. He is not ill-appearing or diaphoretic.  HENT:     Right Ear: External ear normal.     Left Ear: External ear normal.  Eyes:     General: No scleral icterus.    Extraocular Movements: Extraocular movements intact.     Conjunctiva/sclera: Conjunctivae normal.  Cardiovascular:     Rate and Rhythm: Normal rate and regular rhythm.     Heart sounds: No murmur heard.   Pulmonary:     Effort: Pulmonary effort is normal. No respiratory distress.     Breath sounds: Normal breath sounds. No wheezing.  Musculoskeletal:     Cervical back: Neck supple.  Skin:    General: Skin is warm and dry.     Comments: Clubbing on fingers  Neurological:     Mental Status: He is alert. Mental status is at baseline.  Psychiatric:        Mood and Affect:  Mood normal.           Assessment & Plan:   Problem List Items Addressed This Visit      Respiratory   Emphysema lung (HCC) - Primary    Currently on prednisone for back pain but improvement with albuterol. Suspect COPD. Start advair twice daily. Cont prn albuterol.       Relevant Medications   Fluticasone-Salmeterol (ADVAIR DISKUS) 250-50 MCG/DOSE AEPB     Digestive   Alcoholic cirrhosis of liver with ascites (HCC)    Encouraged importance of following up with GI. Has appt tomorrow. Reminded of time. Appreciate GI support for paracentesis and liver f/u. Cont spironolactone 100 mg bid and furosemide 40 mg daily        Nervous and Auditory   Lumbar radiculopathy, acute    Improvement with steroids. Reviewed Dr. note. Agree with plan for discussing safest pain control option with GI.           Return in about 6 months (around 05/12/2021).  07/12/2021, MD  This visit occurred during  the SARS-CoV-2 public health emergency.  Safety protocols were in place, including screening questions prior to the visit, additional usage of staff PPE, and extensive cleaning of exam room while observing appropriate contact time as indicated for disinfecting solutions.

## 2020-11-10 NOTE — Assessment & Plan Note (Signed)
Improvement with steroids. Reviewed Dr. Cyndie Chime note. Agree with plan for discussing safest pain control option with GI.

## 2020-11-10 NOTE — Assessment & Plan Note (Signed)
Currently on prednisone for back pain but improvement with albuterol. Suspect COPD. Start advair twice daily. Cont prn albuterol.

## 2020-11-10 NOTE — Assessment & Plan Note (Addendum)
Encouraged importance of following up with GI. Has appt tomorrow. Reminded of time. Appreciate GI support for paracentesis and liver f/u. Cont spironolactone 100 mg bid and furosemide 40 mg daily

## 2020-11-10 NOTE — Patient Instructions (Signed)
#  Breathing - Start Advair every day - continue to use albuterol inhaler as needed for wheezing or breathing difficulty  #Back pain - I will touch base with Dr.Copland about treatment

## 2020-11-11 ENCOUNTER — Ambulatory Visit: Payer: Commercial Managed Care - PPO | Admitting: Gastroenterology

## 2020-11-11 ENCOUNTER — Encounter: Payer: Self-pay | Admitting: Gastroenterology

## 2020-11-11 VITALS — BP 132/70 | HR 94 | Temp 98.1°F | Ht 69.0 in | Wt 214.5 lb

## 2020-11-11 DIAGNOSIS — R748 Abnormal levels of other serum enzymes: Secondary | ICD-10-CM | POA: Diagnosis not present

## 2020-11-11 DIAGNOSIS — K7031 Alcoholic cirrhosis of liver with ascites: Secondary | ICD-10-CM | POA: Diagnosis not present

## 2020-11-11 NOTE — Patient Instructions (Signed)
Please call Britta Mccreedy at [phone] Specialty scheduling once you and your daughter are ready to schedule your liver biopsy.

## 2020-11-12 LAB — BASIC METABOLIC PANEL
BUN/Creatinine Ratio: 16 (ref 9–20)
BUN: 12 mg/dL (ref 6–24)
CO2: 20 mmol/L (ref 20–29)
Calcium: 8.7 mg/dL (ref 8.7–10.2)
Chloride: 104 mmol/L (ref 96–106)
Creatinine, Ser: 0.73 mg/dL — ABNORMAL LOW (ref 0.76–1.27)
Glucose: 140 mg/dL — ABNORMAL HIGH (ref 65–99)
Potassium: 5 mmol/L (ref 3.5–5.2)
Sodium: 139 mmol/L (ref 134–144)
eGFR: 106 mL/min/{1.73_m2} (ref >59)

## 2020-11-12 NOTE — Progress Notes (Signed)
[name], MD [address]  [address]  [place], [place] [address]  Main: [phone]  Fax: [phone]   Primary Care Physician: Lynnda Child, MD   Chief Complaint  Patient presents with  . Elevated Hepatic Enzymes    HPI: Edwin West is a 60 y.o. male here for follow-up of elevated liver enzymes.  Patient continues to be noncompliant with follow-up and several tests ordered including EGD, colonoscopy, liver biopsies, ultrasound etc.  He is taking his Lasix and spironolactone as ordered.  Current Outpatient Medications  Medication Sig Dispense Refill  . albuterol (VENTOLIN HFA) 108 (90 Base) MCG/ACT inhaler Inhale 2 puffs into the lungs every 6 (six) hours as needed for wheezing or shortness of breath. 8 g 2  . Fluticasone-Salmeterol (ADVAIR DISKUS) 250-50 MCG/DOSE AEPB Inhale 1 puff into the lungs 2 (two) times daily. 1 each 3  . furosemide (LASIX) 40 MG tablet Take 1 tablet (40 mg total) by mouth daily. 90 tablet 0  . predniSONE (DELTASONE) 20 MG tablet 2 tabs po for 7 days, then 1 tab po for 7 days 21 tablet 0  . spironolactone (ALDACTONE) 100 MG tablet Take 1 tablet (100 mg total) by mouth 2 (two) times daily. 180 tablet 0   No current facility-administered medications for this visit.    Allergies as of 11/11/2020  . (No Known Allergies)    ROS:  General: Negative for anorexia, weight loss, fever, chills, fatigue, weakness. ENT: Negative for hoarseness, difficulty swallowing , nasal congestion. CV: Negative for chest pain, angina, palpitations, dyspnea on exertion, peripheral edema.  Respiratory: Negative for dyspnea at rest, dyspnea on exertion, cough, sputum, wheezing.  GI: See history of present illness. GU:  Negative for dysuria, hematuria, urinary incontinence, urinary frequency, nocturnal urination.  Endo: Negative for unusual weight change.    Physical Examination:   BP 132/70 (BP Location: Left Arm, Patient Position: Sitting,  Cuff Size: Normal)   Pulse 94   Temp 98.1 F (36.7 C) (Oral)   Ht 5\' 9"  (1.753 m)   Wt 214 lb 8 oz (97.3 kg)   BMI 31.68 kg/m   General: Well-nourished, well-developed in no acute distress.  Eyes: No icterus. Conjunctivae pink. Mouth: Oropharyngeal mucosa moist and pink , no lesions erythema or exudate. Neck: Supple, Trachea midline Abdomen: Bowel sounds are normal, nontender, distended, no hepatosplenomegaly or masses, no abdominal bruits or hernia , no rebound or guarding.   Extremities: 1+ lower extremity edema. No clubbing or deformities. Neuro: Alert and oriented x 3.  Grossly intact. Skin: Warm and dry, no jaundice.   Psych: Alert and cooperative, normal mood and affect.   Labs: CMP     Component Value Date/Time   NA 139 11/11/2020 1609   K 5.0 11/11/2020 1609   CL 104 11/11/2020 1609   CO2 20 11/11/2020 1609   GLUCOSE 140 (H) 11/11/2020 1609   GLUCOSE 91 06/16/2018 0829   BUN 12 11/11/2020 1609   CREATININE 0.73 (L) 11/11/2020 1609   CALCIUM 8.7 11/11/2020 1609   PROT 6.0 09/18/2020 1621   ALBUMIN 3.2 (L) 09/18/2020 1621   AST 70 (H) 09/18/2020 1621   ALT 53 (H) 09/18/2020 1621   ALKPHOS 556 (H) 09/18/2020 1621   BILITOT 1.7 (H) 09/18/2020 1621   GFRNONAA 105 09/18/2020 1621   GFRAA 121 09/18/2020 1621   Lab Results  Component Value Date   WBC 4.8 04/09/2020   HGB 11.5 (L) 04/09/2020   HCT 31.5 (L) 04/09/2020  MCV 103 (H) 04/09/2020   PLT 94 (LL) 04/09/2020    Imaging Studies: No results found.  Assessment and Plan:   Edwin West is a 60 y.o. y/o male here for follow-up of cirrhosis and elevated liver enzymes  I spent an extensive amount of time discussing the importance of the ordered tests with the patient as he has continued to delay diagnosis and his liver enzymes remain elevated out of proportion to what would be expected in a cirrhotic patient who has quit drinking alcohol  After thorough discussion, he again states that he will talk  to his daughter and call us back to schedule his tests, especially his liver biopsy.  His BMP today does not show any hyponatremia or hypokalemia.  Given his abdominal distention and lower extremity edema, will increase Lasix to 60 mg daily and continue spironolactone at 200 mg a day  Follow-up in clinic closely  Dr [name]

## 2020-11-19 ENCOUNTER — Telehealth: Payer: Self-pay

## 2020-11-19 MED ORDER — SPIRONOLACTONE 100 MG PO TABS: 200.0000 mg | ORAL_TABLET | Freq: Once | ORAL | 1 refills | Status: DC

## 2020-11-19 MED ORDER — FUROSEMIDE 20 MG PO TABS: 60.0000 mg | ORAL_TABLET | Freq: Every day | ORAL | 1 refills | Status: DC

## 2020-11-19 NOTE — Telephone Encounter (Signed)
-----   Message from Pasty Spillers, MD sent at 11/12/2020  4:53 PM EDT ----- Kandis Cocking please increase patient's Lasix to 60 mg once a day.  Spironolactone is listed at 100 mg 2 times daily.  Please change spironolactone to 200 mg once a day.

## 2020-11-19 NOTE — Telephone Encounter (Signed)
Called patient and had to leave him a detailed message letting him know what Dr. Maximino Greenland recommended. I will go ahead and send him his new prescriptions to his pharmacy.

## 2020-11-24 ENCOUNTER — Other Ambulatory Visit: Payer: Self-pay

## 2020-11-24 ENCOUNTER — Encounter: Payer: Self-pay | Admitting: Family Medicine

## 2020-11-24 ENCOUNTER — Ambulatory Visit: Payer: Commercial Managed Care - PPO | Admitting: Family Medicine

## 2020-11-24 VITALS — BP 110/58 | HR 84 | Temp 98.3°F | Ht 69.0 in | Wt 203.5 lb

## 2020-11-24 DIAGNOSIS — K7031 Alcoholic cirrhosis of liver with ascites: Secondary | ICD-10-CM

## 2020-11-24 DIAGNOSIS — M5416 Radiculopathy, lumbar region: Secondary | ICD-10-CM

## 2020-11-24 MED ORDER — TRAMADOL HCL 50 MG PO TABS: 50.0000 mg | ORAL_TABLET | Freq: Three times a day (TID) | ORAL | 0 refills | Status: DC | PRN

## 2020-11-24 NOTE — Progress Notes (Signed)
[name], MD, CAQ Sports Medicine  Primary Care and Sports Medicine [facility] at [facility] [address] [place], [address]  Phone: [phone]  FAX: [phone]  Edwin West - 60 y.o. male  MRN [id number]  Date of Birth: 09-Oct-1960  Date: 11/24/2020  PCP: Lynnda Child, MD  Referral: Lynnda Child, MD  Chief Complaint  Patient presents with  . Back Pain    This visit occurred during the SARS-CoV-2 public health emergency.  Safety protocols were in place, including screening questions prior to the visit, additional usage of staff PPE, and extensive cleaning of exam room while observing appropriate contact time as indicated for disinfecting solutions.   Subjective:   Edwin West is a 60 y.o. very pleasant male patient with Body mass index is 30.05 kg/m. who presents with the following:  Ongoing lumbar radiculopathy that has been severe, and this is the second thim that I have seen him in the last few months.  He has had some ongoing back pain with right-sided lumbar radiculopathy.  He has not had any focal neurological deficit.  He is ambulating with pain much of the time.  He does do a lot of lifting at work, and this has been quite difficult for him.  He did get some relief with some steroid burst, but this fairly quickly resolved  His history is significant also for with some alcoholic cirrhosis.  I did contact his GI doctor, she did give based on recommendations for therapy.  She did feel like some low-dose opioids would probably be appropriate, but she was more cautious with Tylenol which she felt like he should avoid and rare use of NSAIDs may or may not be appropriate.  He has had some ongoing back pain for the last several months.  Review of Systems is noted in the HPI, as appropriate   Objective:   BP (!) 110/58   Pulse 84   Temp 98.3 F (36.8 C) (Temporal)   Ht 5\' 9"  (1.753 m)   Wt 203 lb 8 oz (92.3  kg)   SpO2 96%   BMI 30.05 kg/m    Range of motion at  the waist: Flexion, extension, lateral bending and rotation: Forward flexion to 70, pain with extension and mild limitation only with lateral bending and rotation  No echymosis or edema Rises to examination table with mild difficulty Gait: minimally antalgic  Inspection/Deformity: N Paraspinus Tenderness: L2-S1 bilaterally  B Ankle Dorsiflexion (L5,4): 5/5 B Great Toe Dorsiflexion (L5,4): 5/5 Heel Walk (L5): WNL Toe Walk (S1): WNL Rise/Squat (L4): WNL, mild pain  SENSORY B Medial Foot (L4): WNL B Dorsum (L5): WNL B Lateral (S1): WNL Light Touch: WNL Pinprick: WNL  REFLEXES Knee (L4): 2+ Ankle (S1): 2+  B SLR, seated: neg B SLR, supine: neg B FABER: neg B Reverse FABER: neg B Greater Troch: NT B Log Roll: neg B Stork: NT B Sciatic Notch: NT   Radiology: DG Lumbar Spine Complete  Result Date: 08/20/2020 CLINICAL DATA:  Lumbar radiculopathy for 3 months EXAM: LUMBAR SPINE - COMPLETE 4+ VIEW COMPARISON:  Chest radiographs 04/03/2018 FINDINGS: 6 non-rib-bearing lumbar type vertebra. Scattered disc space narrowing and minimal endplate spur formation. Minimal chronic anterior height loss of L1, unchanged since 2019, potentially developmental. No acute fracture, subluxation, or bone destruction. No definite spondylolysis. SI joints preserved. IMPRESSION: Mild scattered degenerative disc disease changes lumbar spine. No acute abnormalities. Electronically Signed   By: 2020  M.D.   On: 08/20/2020 13:01   Assessment and Plan:     ICD-10-CM   1. Lumbar radiculopathy, acute  M54.16   2. Alcoholic cirrhosis of liver with ascites (HCC)  K70.31    Acute on chronic back pain with exacerbation.  I think this is going to be a challenging case indefinitely.  His cirrhosis limits much of care.  I will place him on some low-dose tramadol, and this could be titrated to effect.  I think that that is probably the lesser evil  compared to Tylenol or ibuprofen in this case.  Additional opioids might be needed for effect.  Lower doses based on liver function likely needed.  He ultimately may require some pain medication intermittently indefinitely.  I will communicate this with the patient's primary care doctor, and she and I will have already talked about this face-to-face.  [employer] One speech-to-text software was used for transcription in this dictation.  Possible transcriptional errors can occur using Animal nutritionist.   Meds ordered this encounter  Medications  . traMADol (ULTRAM) 50 MG tablet    Sig: Take 1 tablet (50 mg total) by mouth every 8 (eight) hours as needed for moderate pain.    Dispense:  20 tablet    Refill:  0   Medications Discontinued During This Encounter  Medication Reason  . predniSONE (DELTASONE) 20 MG tablet Completed Course   No orders of the defined types were placed in this encounter.   Follow-up: No follow-ups on file.  Signed,  Elpidio Galea. Haleemah Buckalew, MD   Outpatient Encounter Medications as of 11/24/2020  Medication Sig  . albuterol (VENTOLIN HFA) 108 (90 Base) MCG/ACT inhaler Inhale 2 puffs into the lungs every 6 (six) hours as needed for wheezing or shortness of breath.  . Fluticasone-Salmeterol (ADVAIR DISKUS) 250-50 MCG/DOSE AEPB Inhale 1 puff into the lungs 2 (two) times daily.  . furosemide (LASIX) 20 MG tablet Take 3 tablets (60 mg total) by mouth daily.  Marland Kitchen spironolactone (ALDACTONE) 100 MG tablet Take 2 tablets (200 mg total) by mouth once for 1 dose.  . traMADol (ULTRAM) 50 MG tablet Take 1 tablet (50 mg total) by mouth every 8 (eight) hours as needed for moderate pain.  . [DISCONTINUED] predniSONE (DELTASONE) 20 MG tablet 2 tabs po for 7 days, then 1 tab po for 7 days   No facility-administered encounter medications on file as of 11/24/2020.

## 2020-11-25 ENCOUNTER — Telehealth: Payer: Self-pay

## 2020-11-25 NOTE — Telephone Encounter (Signed)
LVM for pt to call me about FMLA paperwork 

## 2020-11-26 NOTE — Telephone Encounter (Signed)
LVM again call to me

## 2020-11-27 ENCOUNTER — Encounter: Payer: Self-pay | Admitting: Family Medicine

## 2020-11-27 NOTE — Telephone Encounter (Signed)
LVM again for pt to call me about FMLA paperwork

## 2020-12-01 NOTE — Telephone Encounter (Signed)
LVM for pt to call me about FMLA paperwork 

## 2020-12-03 NOTE — Telephone Encounter (Signed)
LVM for pt to call me about FMLA paperwork 

## 2020-12-03 NOTE — Telephone Encounter (Signed)
Called Almira Coaster (Manufacturing systems engineer) and reported that I had not been able to connect with the pt.  Confirmed phone number was correct.  She was going to have the supervisor mention this and have the pt call

## 2020-12-05 ENCOUNTER — Other Ambulatory Visit: Payer: Self-pay | Admitting: Family Medicine

## 2020-12-05 NOTE — Telephone Encounter (Signed)
Last OV: 11/24/20 Last refill: 11/24/20 #20 no refills  Follow up visit 01/07/21

## 2020-12-08 NOTE — Telephone Encounter (Signed)
This is the gentleman with cirrhosis that we have spoken about a few times.  I suspect titration will be needed with opioids - no easy solution here.  Usually, I let the primary care doctors manage this.  I am not sure how much I can offer.    Some of the pain doctors might help if it becomes difficult.

## 2020-12-09 NOTE — Telephone Encounter (Signed)
LVM for pt to call me about paperwork

## 2020-12-09 NOTE — Telephone Encounter (Signed)
He will need an appointment with me to discuss chronic pain management.   #20 sent to pharmacy  Ideally this will last 20 days - as would suggest taking only if severe pain. He will need a follow-up appointment with me before he runs out.

## 2020-12-11 NOTE — Telephone Encounter (Signed)
Semi completed FMLA paperwork placed in PCP's inbox for review, completion, sign and date  

## 2020-12-17 NOTE — Telephone Encounter (Signed)
Completed.

## 2020-12-18 ENCOUNTER — Telehealth: Payer: Self-pay | Admitting: Gastroenterology

## 2020-12-18 NOTE — Telephone Encounter (Signed)
Patient called LVM for call back.  Reports severe heartburn and wants advice on what he can do to treat.

## 2020-12-18 NOTE — Telephone Encounter (Signed)
LVM to inform pt FMLA paperwork completed and faxed  Copy mailed to pt  Copy for scan   Copy retained by me

## 2020-12-18 NOTE — Telephone Encounter (Signed)
Patient has severe heartburn and stomach pain. Would like a triage call asap.

## 2020-12-23 NOTE — Telephone Encounter (Signed)
Called patient. No answer. LVM for patient to return call 

## 2021-01-06 ENCOUNTER — Ambulatory Visit: Payer: Commercial Managed Care - PPO | Admitting: Family Medicine

## 2021-01-06 NOTE — Progress Notes (Signed)
Edwin Kreiser T. Edwin Steffensmeier, MD, CAQ Sports Medicine  Primary Care and Sports Medicine [facility] at [facility] [address] [place], [address]  Phone: [phone]  FAX: [phone]  Edwin West - 60 y.o. male  MRN [id number]  Date of Birth: 1960/10/28  Date: 01/07/2021  PCP: Edwin Child, MD  Referral: Edwin Child, MD  Chief Complaint  Patient presents with  . Follow-up    6 wk- lumbar pain     This visit occurred during the SARS-CoV-2 public health emergency.  Safety protocols were in place, including screening questions prior to the visit, additional usage of staff PPE, and extensive cleaning of exam room while observing appropriate contact time as indicated for disinfecting solutions.   Subjective:   Edwin West is a 60 y.o. very pleasant male patient with Body mass index is 29.02 kg/m. who presents with the following:  He is here in follow-up for some recalcitrant back pain and radiculopathy for greater than 6 months.  He does have a history of cirrhosis.  He had some mild relief of symptoms with some oral steroids.  On his last office visit I did place him on some tramadol to evaluate for effect.  His cirrhosis does limit some of his traditional algorithmic treatment pathways, and I did discuss this with his gastroenterologist.  She felt like some lower dose pain medication would likely be tolerable.  The patient, his primary care doctor and the GI feel as if both Tylenol and NSAIDs would be of significantly impact given his alcoholic cirrhosis, portal hypertension, and risk.  R leg going down to his R knee.  He is in pain most of the time.  Right now at work, 40 lbs with a chain pull.  He has a work modification right now.  12/05/2020 Last OV with Edwin Beat, MD Ongoing lumbar radiculopathy that has been severe, and this is the second thim that I have seen him in the last few months.  He has had some ongoing back pain  with right-sided lumbar radiculopathy.  He has not had any focal neurological deficit.  He is ambulating with pain much of the time.  He does do a lot of lifting at work, and this has been quite difficult for him.   He did get some relief with some steroid burst, but this fairly quickly resolved   His history is significant also for with some alcoholic cirrhosis.  I did contact his GI doctor, she did give based on recommendations for therapy.  She did feel like some low-dose opioids would probably be appropriate, but she was more cautious with Tylenol which she felt like he should avoid and rare use of NSAIDs may or may not be appropriate.   He has had some ongoing back pain for the last several months.   Review of Systems is noted in the HPI, as appropriate   Patient Active Problem List   Diagnosis Date Noted  . Esophageal varices determined by endoscopy (HCC) 01/08/2021  . Lumbar radiculopathy, acute 11/10/2020  . Emphysema lung (HCC) 09/10/2020  . Alcohol abuse, in remission 10/08/2018  . Umbilical hernia without obstruction and without gangrene 10/08/2018  . Hoarseness of voice 10/08/2018  . Tobacco abuse 10/08/2018  . Rectal polyp   . Esophageal varices in alcoholic cirrhosis (HCC)   . Portal hypertension (HCC)   . Portal hypertensive gastropathy (HCC)   . Alcoholic cirrhosis of liver with ascites (HCC) 05/21/2018  . Anemia 05/21/2018  Past Medical History:  Diagnosis Date  . Cirrhosis of liver (HCC) 04/2018    Past Surgical History:  Procedure Laterality Date  . COLONOSCOPY WITH PROPOFOL N/A 06/16/2018   Procedure: COLONOSCOPY WITH PROPOFOL;  Surgeon: Edwin Spillers, MD;  Location: [facility] ENDOSCOPY;  Service: Endoscopy;  Laterality: N/A;  . ESOPHAGOGASTRODUODENOSCOPY (EGD) WITH PROPOFOL N/A 06/16/2018   Procedure: ESOPHAGOGASTRODUODENOSCOPY (EGD) WITH PROPOFOL;  Surgeon: Edwin Spillers, MD;  Location: [facility] ENDOSCOPY;  Service: Endoscopy;  Laterality: N/A;  .  GIVENS CAPSULE STUDY N/A 07/12/2018   Procedure: GIVENS CAPSULE STUDY;  Surgeon: Edwin Spillers, MD;  Location: [facility] ENDOSCOPY;  Service: Endoscopy;  Laterality: N/A;    Family History  Problem Relation Age of Onset  . Heart failure Sister   . Cervical cancer Sister   . Breast cancer Other 49  . Heart failure Other   . Hypertension Father   . Stroke Brother      Objective:   BP 100/60   Pulse 82   Temp 98.3 F (36.8 C) (Temporal)   Ht 5\' 9"  (1.753 m)   Wt 196 lb 8 oz (89.1 kg)   SpO2 95%   BMI 29.02 kg/m    Range of motion at  the waist: Flexion, extension, lateral bending and rotation: Limited to 40 degrees of forward flexion.  Any extension causes pain.  Lateral bending is more preserved, but causes significant pain as well as rotational movements.  No echymosis or edema Rises to examination table with mild difficulty Gait: Moderately antalgic  Inspection/Deformity: N Paraspinus Tenderness: Diffusely tender L1-S1 bilaterally.  Rigid.  B Ankle Dorsiflexion (L5,4): 5/5 B Great Toe Dorsiflexion (L5,4): 5/5 Heel Walk (L5): WNL Toe Walk (S1): WNL Rise/Squat (L4): WNL, mild pain  SENSORY B Medial Foot (L4): WNL B Dorsum (L5): WNL B Lateral (S1): WNL Light Touch: WNL Pinprick: WNL  REFLEXES Knee (L4): 2+ Ankle (S1): 2+  B SLR, seated: neg B SLR, supine: Positive  B FABER: Positive B Reverse FABER: neg B Greater Troch: NT B Log Roll: neg B Stork: Positive B Sciatic Notch: Painful  Radiology: CLINICAL DATA:  Lumbar radiculopathy for 3 months  EXAM: LUMBAR SPINE - COMPLETE 4+ VIEW  COMPARISON:  Chest radiographs 04/03/2018  FINDINGS: 6 non-rib-bearing lumbar type vertebra.  Scattered disc space narrowing and minimal endplate spur formation.  Minimal chronic anterior height loss of L1, unchanged since 2019, potentially developmental.  No acute fracture, subluxation, or bone destruction.  No definite spondylolysis.  SI joints  preserved.  IMPRESSION: Mild scattered degenerative disc disease changes lumbar spine.  No acute abnormalities.   Electronically Signed   By: 2020 M.D.   On: 08/20/2020 13:01   Assessment and Plan:     ICD-10-CM   1. Lumbar radiculopathy, acute  M54.16 traMADol (ULTRAM) 50 MG tablet    MR Lumbar Spine Wo Contrast  2. Low back pain of over 3 months duration  M54.50 traMADol (ULTRAM) 50 MG tablet    MR Lumbar Spine Wo Contrast  3. Reduced straight leg raising of right lower extremity  R29.898   4. Esophageal varices determined by endoscopy (HCC)  I85.00   5. Alcoholic cirrhosis of liver with ascites (HCC)  K70.31    Recalcitrant back pain it is severely impacting the patient's ability to work and quality of life.  Ongoing now greater than 6 months.  Chronic cirrhosis with liver impairment alters traditional algorithms of back pain and overall neurological origin pain.  He has had modest improvement with  several rounds of oral steroids.  Tylenol and NSAIDs are contraindicated secondary to alcoholic cirrhosis, esophageal varices secondary to cirrhosis, and he has had modest help with tramadol only.  He has had work modification and dedicated rehab for months now without improvement.  Obtain a lumbar spine MRI without contrast to evaluate for disc herniation in the setting of failure of conservative back care greater than 6 months with some medical limitation based on the patient's quite significant cirrhosis.  He has failed oral steroids, mild pain medication, modification of activity, nonpharmacological modalities.   Obtain an MRI to evaluate for potential procedural options for longer-term Pain management.  I think that the patient will have ongoing indefinite back pain permanently, and it will be of great benefit to the patient to establish potential procedural options given his medical management limitations based on his cirrhosis.  Meds ordered this encounter   Medications  . traMADol (ULTRAM) 50 MG tablet    Sig: Take 1 tablet (50 mg total) by mouth every 8 (eight) hours as needed for moderate pain.    Dispense:  50 tablet    Refill:  1    Not to exceed 5 additional fills before 05/23/2021   Medications Discontinued During This Encounter  Medication Reason  . traMADol (ULTRAM) 50 MG tablet Reorder   Orders Placed This Encounter  Procedures  . MR Lumbar Spine Wo Contrast    Follow-up: No follow-ups on file.  Signed,  Elpidio Galea. Alakai Macbride, MD   Outpatient Encounter Medications as of 01/07/2021  Medication Sig  . albuterol (VENTOLIN HFA) 108 (90 Base) MCG/ACT inhaler Inhale 2 puffs into the lungs every 6 (six) hours as needed for wheezing or shortness of breath.  . Fluticasone-Salmeterol (ADVAIR DISKUS) 250-50 MCG/DOSE AEPB Inhale 1 puff into the lungs 2 (two) times daily.  Marland Kitchen ibuprofen (ADVIL) 400 MG tablet Take 400 mg by mouth every 6 (six) hours as needed for moderate pain or mild pain.  . [DISCONTINUED] furosemide (LASIX) 20 MG tablet Take 3 tablets (60 mg total) by mouth daily.  . [DISCONTINUED] traMADol (ULTRAM) 50 MG tablet TAKE 1 TABLET (50 MG TOTAL) BY MOUTH EVERY 8 (EIGHT) HOURS AS NEEDED FOR MODERATE PAIN  . traMADol (ULTRAM) 50 MG tablet Take 1 tablet (50 mg total) by mouth every 8 (eight) hours as needed for moderate pain.  . [DISCONTINUED] spironolactone (ALDACTONE) 100 MG tablet Take 2 tablets (200 mg total) by mouth once for 1 dose.   No facility-administered encounter medications on file as of 01/07/2021.

## 2021-01-07 ENCOUNTER — Ambulatory Visit: Payer: Commercial Managed Care - PPO | Admitting: Family Medicine

## 2021-01-07 ENCOUNTER — Encounter: Payer: Self-pay | Admitting: Gastroenterology

## 2021-01-07 ENCOUNTER — Other Ambulatory Visit: Payer: Self-pay

## 2021-01-07 ENCOUNTER — Ambulatory Visit: Payer: Commercial Managed Care - PPO | Admitting: Gastroenterology

## 2021-01-07 VITALS — BP 100/60 | HR 82 | Temp 98.3°F | Ht 69.0 in | Wt 196.5 lb

## 2021-01-07 VITALS — BP 106/61 | HR 82 | Wt 197.0 lb

## 2021-01-07 DIAGNOSIS — R29898 Other symptoms and signs involving the musculoskeletal system: Secondary | ICD-10-CM

## 2021-01-07 DIAGNOSIS — K7031 Alcoholic cirrhosis of liver with ascites: Secondary | ICD-10-CM

## 2021-01-07 DIAGNOSIS — M5416 Radiculopathy, lumbar region: Secondary | ICD-10-CM

## 2021-01-07 DIAGNOSIS — I8500 Esophageal varices without bleeding: Secondary | ICD-10-CM | POA: Diagnosis not present

## 2021-01-07 DIAGNOSIS — R1013 Epigastric pain: Secondary | ICD-10-CM | POA: Diagnosis not present

## 2021-01-07 DIAGNOSIS — R748 Abnormal levels of other serum enzymes: Secondary | ICD-10-CM | POA: Diagnosis not present

## 2021-01-07 DIAGNOSIS — M545 Low back pain, unspecified: Secondary | ICD-10-CM

## 2021-01-07 MED ORDER — SPIRONOLACTONE 100 MG PO TABS: 200.0000 mg | ORAL_TABLET | Freq: Every day | ORAL | 0 refills | Status: AC

## 2021-01-07 MED ORDER — FUROSEMIDE 20 MG PO TABS: 40.0000 mg | ORAL_TABLET | Freq: Every day | ORAL | 0 refills | Status: AC

## 2021-01-07 MED ORDER — OMEPRAZOLE 40 MG PO CPDR: 40.0000 mg | DELAYED_RELEASE_CAPSULE | Freq: Every day | ORAL | 0 refills | Status: AC

## 2021-01-07 MED ORDER — TRAMADOL HCL 50 MG PO TABS
50.0000 mg | ORAL_TABLET | Freq: Three times a day (TID) | ORAL | 1 refills | Status: AC | PRN
Start: 2021-01-07 — End: ?

## 2021-01-07 NOTE — Progress Notes (Signed)
[name], MD [address]  [address]  [address] [place], [place] [address]  Main: [phone]  Fax: [phone]   Primary Care Physician: Lynnda Child, MD   Chief Complaint  Patient presents with  . 2 Month Follow Up    Epigastric pain x 3 weeks... started Prilosec x 3 weeks with minimal relief... denies nausea/vomiting    HPI: Ronit Marczak is a 60 y.o. male with history of alcoholic cirrhosis here for follow-up.  Patient has remained noncompliant to multiple recommendations including need for liver biopsy, EGD and colonoscopy, medication compliance and others.  On last visit patient was supposed to increase his Lasix to 60 mg once daily, and spironolactone to 200 mg once daily, but he has continued to take Lasix 20 mg once daily and spironolactone 100 mg once daily.  He has noted improvement in abdominal distention.  No episodes of confusion or bleeding.  Does report some epigastric pain and has started taking Prilosec over-the-counter as needed.  Current Outpatient Medications  Medication Sig Dispense Refill  . albuterol (VENTOLIN HFA) 108 (90 Base) MCG/ACT inhaler Inhale 2 puffs into the lungs every 6 (six) hours as needed for wheezing or shortness of breath. 8 g 2  . Fluticasone-Salmeterol (ADVAIR DISKUS) 250-50 MCG/DOSE AEPB Inhale 1 puff into the lungs 2 (two) times daily. 1 each 3  . ibuprofen (ADVIL) 400 MG tablet Take 400 mg by mouth every 6 (six) hours as needed for moderate pain or mild pain.    Marland Kitchen omeprazole (PRILOSEC) 40 MG capsule Take 1 capsule (40 mg total) by mouth daily for 28 days. 28 capsule 0  . traMADol (ULTRAM) 50 MG tablet Take 1 tablet (50 mg total) by mouth every 8 (eight) hours as needed for moderate pain. 50 tablet 1  . furosemide (LASIX) 20 MG tablet Take 2 tablets (40 mg total) by mouth daily. 180 tablet 0  . spironolactone (ALDACTONE) 100 MG tablet Take 2 tablets (200 mg total) by mouth daily for 90 doses. 180 tablet 0   No current  facility-administered medications for this visit.    Allergies as of 01/07/2021  . (No Known Allergies)    ROS:  General: Negative for anorexia, weight loss, fever, chills, fatigue, weakness. ENT: Negative for hoarseness, difficulty swallowing , nasal congestion. CV: Negative for chest pain, angina, palpitations, dyspnea on exertion, peripheral edema.  Respiratory: Negative for dyspnea at rest, dyspnea on exertion, cough, sputum, wheezing.  GI: See history of present illness. GU:  Negative for dysuria, hematuria, urinary incontinence, urinary frequency, nocturnal urination.  Endo: Negative for unusual weight change.    Physical Examination:   BP 106/61   Pulse 82   Wt 197 lb (89.4 kg)   BMI 29.09 kg/m   General: Well-nourished, well-developed in no acute distress.  Eyes: No icterus. Conjunctivae pink. Mouth: Oropharyngeal mucosa moist and pink , no lesions erythema or exudate. Neck: Supple, Trachea midline Abdomen: Bowel sounds are normal, nontender, distended, no hepatosplenomegaly or masses, no abdominal bruits or hernia , no rebound or guarding.   Extremities: 1+ lower extremity edema. No clubbing or deformities. Neuro: Alert and oriented x 3.  Grossly intact. Skin: Warm and dry, no jaundice.   Psych: Alert and cooperative, normal mood and affect.   Labs: CMP     Component Value Date/Time   NA 139 11/11/2020 1609   K 5.0 11/11/2020 1609   CL 104 11/11/2020 1609   CO2 20 11/11/2020 1609   GLUCOSE 140 (H) 11/11/2020  1609   GLUCOSE 91 06/16/2018 0829   BUN 12 11/11/2020 1609   CREATININE 0.73 (L) 11/11/2020 1609   CALCIUM 8.7 11/11/2020 1609   PROT 6.0 09/18/2020 1621   ALBUMIN 3.2 (L) 09/18/2020 1621   AST 70 (H) 09/18/2020 1621   ALT 53 (H) 09/18/2020 1621   ALKPHOS 556 (H) 09/18/2020 1621   BILITOT 1.7 (H) 09/18/2020 1621   GFRNONAA 105 09/18/2020 1621   GFRAA 121 09/18/2020 1621   Lab Results  Component Value Date   WBC 4.8 04/09/2020   HGB 11.5 (L)  04/09/2020   HCT 31.5 (L) 04/09/2020   MCV 103 (H) 04/09/2020   PLT 94 (LL) 04/09/2020    Imaging Studies: No results found.  Assessment and Plan:   Darshan Solanki is a 60 y.o. y/o male here for follow-up of alcoholic liver cirrhosis, elevated liver enzymes  Abdominal distention has improved compared to before but still present, and also has lower extremity edema.  I have advised him to start taking Lasix 40 mg once daily and spironolactone 200 mg once daily.  We went over these instructions in detail and he verbalized understanding.  Refill sent to the pharmacy.  I will repeat labs today.  We again discussed the importance of EGD and colonoscopy, but patient does not want to schedule at this time.  Liver biopsy has been discussed with him multiple times at previous visits and again today.  I will await labs again today and see where things are.  Given his epigastric pain, obtain H. pylori serology and start omeprazole 40 mg once daily for 4 weeks only  (Risks of PPI use were discussed with patient including bone loss, C. Diff diarrhea, pneumonia, infections, CKD, electrolyte abnormalities.  Pt. Verbalizes understanding and chooses to continue the medication.)    Dr [name]

## 2021-01-08 ENCOUNTER — Encounter: Payer: Self-pay | Admitting: Family Medicine

## 2021-01-08 DIAGNOSIS — I8500 Esophageal varices without bleeding: Secondary | ICD-10-CM | POA: Insufficient documentation

## 2021-01-09 LAB — COMPREHENSIVE METABOLIC PANEL
ALT: 36 IU/L (ref 0–44)
AST: 58 IU/L — ABNORMAL HIGH (ref 0–40)
Albumin/Globulin Ratio: 1 — ABNORMAL LOW (ref 1.2–2.2)
Albumin: 3.1 g/dL — ABNORMAL LOW (ref 3.8–4.9)
Alkaline Phosphatase: 472 IU/L — ABNORMAL HIGH (ref 44–121)
BUN/Creatinine Ratio: 13 (ref 9–20)
BUN: 11 mg/dL (ref 6–24)
Bilirubin Total: 1.7 mg/dL — ABNORMAL HIGH (ref 0.0–1.2)
CO2: 19 mmol/L — ABNORMAL LOW (ref 20–29)
Calcium: 8.5 mg/dL — ABNORMAL LOW (ref 8.7–10.2)
Chloride: 105 mmol/L (ref 96–106)
Creatinine, Ser: 0.87 mg/dL (ref 0.76–1.27)
Globulin, Total: 3 g/dL (ref 1.5–4.5)
Glucose: 104 mg/dL — ABNORMAL HIGH (ref 65–99)
Potassium: 3.9 mmol/L (ref 3.5–5.2)
Sodium: 138 mmol/L (ref 134–144)
Total Protein: 6.1 g/dL (ref 6.0–8.5)
eGFR: 101 mL/min/{1.73_m2} (ref >59)

## 2021-01-09 LAB — H PYLORI, IGM, IGG, IGA AB
H pylori, IgM Abs: <9 units (ref 0.0–8.9)
H. pylori, IgA Abs: <9 units (ref 0.0–8.9)
H. pylori, IgG AbS: 0.23 Index Value (ref 0.00–0.79)

## 2021-01-09 LAB — PROTIME-INR
INR: 1.1 (ref 0.9–1.2)
Prothrombin Time: 11.1 s (ref 9.1–12.0)

## 2021-01-19 ENCOUNTER — Telehealth: Payer: Self-pay

## 2021-01-19 NOTE — Telephone Encounter (Signed)
error 

## 2021-01-20 NOTE — Telephone Encounter (Signed)
Patient has been seen with Dr.Copland, but lvm to schedule f.u with Dr.Cody.

## 2021-02-02 ENCOUNTER — Other Ambulatory Visit: Payer: Self-pay | Admitting: Gastroenterology

## 2021-02-13 ENCOUNTER — Other Ambulatory Visit: Payer: Self-pay | Admitting: Family Medicine

## 2021-02-13 DIAGNOSIS — J439 Emphysema, unspecified: Secondary | ICD-10-CM

## 2021-02-28 ENCOUNTER — Other Ambulatory Visit: Payer: Self-pay | Admitting: Family Medicine

## 2021-02-28 DIAGNOSIS — M545 Low back pain, unspecified: Secondary | ICD-10-CM

## 2021-02-28 DIAGNOSIS — M5416 Radiculopathy, lumbar region: Secondary | ICD-10-CM

## 2021-03-01 NOTE — Telephone Encounter (Signed)
Last office visit 01/07/21 for follow up lumbar pain with Dr. Patsy Lager.  Last refilled 01/07/21 for #50 with 1 refill.  No future appointments with PCP.

## 2021-03-02 NOTE — Telephone Encounter (Signed)
Left voice message to call the office  

## 2021-03-02 NOTE — Telephone Encounter (Signed)
Please have patient schedule f/u appointment with me for pain management

## 2021-03-02 NOTE — Telephone Encounter (Signed)
Shanda Bumps,   We have talked about this case a few times.  It looks like he never had his back MRI that I ordered.  It seems like he has been fairly non-compliant with GI recommendations, too.  I normally do not do chronic pain management.  I use tramadol commonly for chronic back pain.  While I think this is reasonable, I think that you ultimately will have to make that long-term call.

## 2021-03-06 ENCOUNTER — Other Ambulatory Visit: Payer: Self-pay | Admitting: Family Medicine

## 2021-03-06 DIAGNOSIS — M5416 Radiculopathy, lumbar region: Secondary | ICD-10-CM

## 2021-03-06 DIAGNOSIS — M545 Low back pain, unspecified: Secondary | ICD-10-CM

## 2021-03-06 NOTE — Telephone Encounter (Signed)
Last office visit 01/07/2021 for Lumbar Pain.  Last refilled 01/07/2021 for #50 with 1 refill.  No future appointments.

## 2021-03-08 NOTE — Telephone Encounter (Signed)
I will continue to defer to primary care.

## 2021-03-09 NOTE — Telephone Encounter (Signed)
Please let pt know he will need to make an appointment with me for future and additional refills.

## 2021-03-13 NOTE — Telephone Encounter (Signed)
Patient advised. Patient states they are really busy at work and he will need to look at the schedule. Patient stated this medication is for back pain so he continue been able to work. When he does not have Tramadol he has to take Ibuprofen 4 tablets to get some relief. Patient was advised we need to follow up on this issue and discuss best care plan for him. Patient stated he would call back to schedule.  FYI to Dr Selena Batten

## 2021-03-24 ENCOUNTER — Encounter (HOSPITAL_COMMUNITY): Payer: Self-pay

## 2021-03-24 ENCOUNTER — Other Ambulatory Visit: Payer: Self-pay

## 2021-03-24 ENCOUNTER — Inpatient Hospital Stay (HOSPITAL_COMMUNITY): Payer: Commercial Managed Care - PPO

## 2021-03-24 ENCOUNTER — Inpatient Hospital Stay (HOSPITAL_COMMUNITY)
Admission: EM | Admit: 2021-03-24 | Discharge: 2021-04-09 | DRG: 853 | Disposition: E | Payer: Commercial Managed Care - PPO | Attending: Student | Admitting: Student

## 2021-03-24 ENCOUNTER — Other Ambulatory Visit (HOSPITAL_COMMUNITY): Payer: Commercial Managed Care - PPO

## 2021-03-24 ENCOUNTER — Emergency Department (HOSPITAL_COMMUNITY): Payer: Commercial Managed Care - PPO

## 2021-03-24 DIAGNOSIS — L8990 Pressure ulcer of unspecified site, unspecified stage: Secondary | ICD-10-CM | POA: Insufficient documentation

## 2021-03-24 DIAGNOSIS — R0602 Shortness of breath: Secondary | ICD-10-CM | POA: Diagnosis present

## 2021-03-24 DIAGNOSIS — J439 Emphysema, unspecified: Secondary | ICD-10-CM | POA: Diagnosis present

## 2021-03-24 DIAGNOSIS — I480 Paroxysmal atrial fibrillation: Secondary | ICD-10-CM | POA: Diagnosis present

## 2021-03-24 DIAGNOSIS — K767 Hepatorenal syndrome: Secondary | ICD-10-CM | POA: Diagnosis present

## 2021-03-24 DIAGNOSIS — G9341 Metabolic encephalopathy: Secondary | ICD-10-CM | POA: Diagnosis present

## 2021-03-24 DIAGNOSIS — U071 COVID-19: Secondary | ICD-10-CM | POA: Diagnosis present

## 2021-03-24 DIAGNOSIS — I70208 Unspecified atherosclerosis of native arteries of extremities, other extremity: Secondary | ICD-10-CM | POA: Diagnosis present

## 2021-03-24 DIAGNOSIS — J9601 Acute respiratory failure with hypoxia: Secondary | ICD-10-CM

## 2021-03-24 DIAGNOSIS — A403 Sepsis due to Streptococcus pneumoniae: Principal | ICD-10-CM | POA: Diagnosis present

## 2021-03-24 DIAGNOSIS — J1282 Pneumonia due to coronavirus disease 2019: Secondary | ICD-10-CM | POA: Diagnosis present

## 2021-03-24 DIAGNOSIS — Z8249 Family history of ischemic heart disease and other diseases of the circulatory system: Secondary | ICD-10-CM

## 2021-03-24 DIAGNOSIS — N170 Acute kidney failure with tubular necrosis: Secondary | ICD-10-CM | POA: Diagnosis present

## 2021-03-24 DIAGNOSIS — J151 Pneumonia due to Pseudomonas: Secondary | ICD-10-CM | POA: Diagnosis present

## 2021-03-24 DIAGNOSIS — I8510 Secondary esophageal varices without bleeding: Secondary | ICD-10-CM | POA: Diagnosis present

## 2021-03-24 DIAGNOSIS — Z515 Encounter for palliative care: Secondary | ICD-10-CM | POA: Diagnosis not present

## 2021-03-24 DIAGNOSIS — E872 Acidosis: Secondary | ICD-10-CM | POA: Diagnosis present

## 2021-03-24 DIAGNOSIS — J95851 Ventilator associated pneumonia: Secondary | ICD-10-CM | POA: Diagnosis not present

## 2021-03-24 DIAGNOSIS — K766 Portal hypertension: Secondary | ICD-10-CM | POA: Diagnosis present

## 2021-03-24 DIAGNOSIS — Z28310 Unvaccinated for covid-19: Secondary | ICD-10-CM

## 2021-03-24 DIAGNOSIS — I4891 Unspecified atrial fibrillation: Secondary | ICD-10-CM | POA: Diagnosis not present

## 2021-03-24 DIAGNOSIS — D689 Coagulation defect, unspecified: Secondary | ICD-10-CM | POA: Diagnosis present

## 2021-03-24 DIAGNOSIS — Y848 Other medical procedures as the cause of abnormal reaction of the patient, or of later complication, without mention of misadventure at the time of the procedure: Secondary | ICD-10-CM | POA: Diagnosis not present

## 2021-03-24 DIAGNOSIS — Z7189 Other specified counseling: Secondary | ICD-10-CM | POA: Diagnosis not present

## 2021-03-24 DIAGNOSIS — M62241 Nontraumatic ischemic infarction of muscle, right hand: Secondary | ICD-10-CM | POA: Diagnosis not present

## 2021-03-24 DIAGNOSIS — K7290 Hepatic failure, unspecified without coma: Secondary | ICD-10-CM | POA: Diagnosis present

## 2021-03-24 DIAGNOSIS — N179 Acute kidney failure, unspecified: Secondary | ICD-10-CM

## 2021-03-24 DIAGNOSIS — E871 Hypo-osmolality and hyponatremia: Secondary | ICD-10-CM | POA: Diagnosis present

## 2021-03-24 DIAGNOSIS — R34 Anuria and oliguria: Secondary | ICD-10-CM

## 2021-03-24 DIAGNOSIS — Z66 Do not resuscitate: Secondary | ICD-10-CM | POA: Diagnosis not present

## 2021-03-24 DIAGNOSIS — Z452 Encounter for adjustment and management of vascular access device: Secondary | ICD-10-CM

## 2021-03-24 DIAGNOSIS — J9600 Acute respiratory failure, unspecified whether with hypoxia or hypercapnia: Secondary | ICD-10-CM

## 2021-03-24 DIAGNOSIS — E781 Pure hyperglyceridemia: Secondary | ICD-10-CM | POA: Diagnosis not present

## 2021-03-24 DIAGNOSIS — R0902 Hypoxemia: Secondary | ICD-10-CM

## 2021-03-24 DIAGNOSIS — J80 Acute respiratory distress syndrome: Secondary | ICD-10-CM | POA: Diagnosis present

## 2021-03-24 DIAGNOSIS — B965 Pseudomonas (aeruginosa) (mallei) (pseudomallei) as the cause of diseases classified elsewhere: Secondary | ICD-10-CM | POA: Diagnosis not present

## 2021-03-24 DIAGNOSIS — R680 Hypothermia, not associated with low environmental temperature: Secondary | ICD-10-CM | POA: Diagnosis present

## 2021-03-24 DIAGNOSIS — R011 Cardiac murmur, unspecified: Secondary | ICD-10-CM | POA: Diagnosis not present

## 2021-03-24 DIAGNOSIS — D709 Neutropenia, unspecified: Secondary | ICD-10-CM | POA: Diagnosis present

## 2021-03-24 DIAGNOSIS — A419 Sepsis, unspecified organism: Secondary | ICD-10-CM

## 2021-03-24 DIAGNOSIS — Z7951 Long term (current) use of inhaled steroids: Secondary | ICD-10-CM

## 2021-03-24 DIAGNOSIS — E870 Hyperosmolality and hypernatremia: Secondary | ICD-10-CM | POA: Diagnosis not present

## 2021-03-24 DIAGNOSIS — K3189 Other diseases of stomach and duodenum: Secondary | ICD-10-CM | POA: Diagnosis present

## 2021-03-24 DIAGNOSIS — D6959 Other secondary thrombocytopenia: Secondary | ICD-10-CM | POA: Diagnosis present

## 2021-03-24 DIAGNOSIS — Z79899 Other long term (current) drug therapy: Secondary | ICD-10-CM

## 2021-03-24 DIAGNOSIS — K7031 Alcoholic cirrhosis of liver with ascites: Secondary | ICD-10-CM | POA: Diagnosis not present

## 2021-03-24 DIAGNOSIS — R6521 Severe sepsis with septic shock: Secondary | ICD-10-CM | POA: Diagnosis not present

## 2021-03-24 DIAGNOSIS — R739 Hyperglycemia, unspecified: Secondary | ICD-10-CM | POA: Diagnosis not present

## 2021-03-24 DIAGNOSIS — D509 Iron deficiency anemia, unspecified: Secondary | ICD-10-CM | POA: Diagnosis present

## 2021-03-24 DIAGNOSIS — K567 Ileus, unspecified: Secondary | ICD-10-CM

## 2021-03-24 DIAGNOSIS — R748 Abnormal levels of other serum enzymes: Secondary | ICD-10-CM

## 2021-03-24 DIAGNOSIS — I742 Embolism and thrombosis of arteries of the upper extremities: Secondary | ICD-10-CM | POA: Diagnosis not present

## 2021-03-24 DIAGNOSIS — E875 Hyperkalemia: Secondary | ICD-10-CM | POA: Diagnosis present

## 2021-03-24 DIAGNOSIS — F17210 Nicotine dependence, cigarettes, uncomplicated: Secondary | ICD-10-CM | POA: Diagnosis present

## 2021-03-24 DIAGNOSIS — Z4659 Encounter for fitting and adjustment of other gastrointestinal appliance and device: Secondary | ICD-10-CM

## 2021-03-24 LAB — CBC
HCT: 39.7 % (ref 39.0–52.0)
Hemoglobin: 14.1 g/dL (ref 13.0–17.0)
MCH: 36.2 pg — ABNORMAL HIGH (ref 26.0–34.0)
MCHC: 35.5 g/dL (ref 30.0–36.0)
MCV: 102.1 fL — ABNORMAL HIGH (ref 80.0–100.0)
Platelets: 97 10*3/uL — ABNORMAL LOW (ref 150–400)
RBC: 3.89 MIL/uL — ABNORMAL LOW (ref 4.22–5.81)
RDW: 14.9 % (ref 11.5–15.5)
WBC: 1.1 10*3/uL — CL (ref 4.0–10.5)
nRBC: 0 % (ref 0.0–0.2)

## 2021-03-24 LAB — URINALYSIS, ROUTINE W REFLEX MICROSCOPIC
Bilirubin Urine: NEGATIVE
Glucose, UA: NEGATIVE mg/dL
Ketones, ur: NEGATIVE mg/dL
Leukocytes,Ua: NEGATIVE
Nitrite: NEGATIVE
Protein, ur: NEGATIVE mg/dL
Specific Gravity, Urine: 1.015 (ref 1.005–1.030)
pH: 5 (ref 5.0–8.0)

## 2021-03-24 LAB — CBC WITH DIFFERENTIAL/PLATELET
Abs Immature Granulocytes: 0.06 10*3/uL (ref 0.00–0.07)
Basophils Absolute: 0 10*3/uL (ref 0.0–0.1)
Basophils Relative: 0 %
Eosinophils Absolute: 0 10*3/uL (ref 0.0–0.5)
Eosinophils Relative: 1 %
HCT: 42.9 % (ref 39.0–52.0)
Hemoglobin: 14.6 g/dL (ref 13.0–17.0)
Immature Granulocytes: 7 %
Lymphocytes Relative: 27 %
Lymphs Abs: 0.3 10*3/uL — ABNORMAL LOW (ref 0.7–4.0)
MCH: 35.9 pg — ABNORMAL HIGH (ref 26.0–34.0)
MCHC: 34 g/dL (ref 30.0–36.0)
MCV: 105.4 fL — ABNORMAL HIGH (ref 80.0–100.0)
Monocytes Absolute: 0.1 10*3/uL (ref 0.1–1.0)
Monocytes Relative: 7 %
Neutro Abs: 0.5 10*3/uL — ABNORMAL LOW (ref 1.7–7.7)
Neutrophils Relative %: 58 %
Platelets: 72 10*3/uL — ABNORMAL LOW (ref 150–400)
RBC: 4.07 MIL/uL — ABNORMAL LOW (ref 4.22–5.81)
RDW: 15.1 % (ref 11.5–15.5)
WBC: 0.9 10*3/uL — CL (ref 4.0–10.5)
nRBC: 0 % (ref 0.0–0.2)

## 2021-03-24 LAB — POCT I-STAT 7, (LYTES, BLD GAS, ICA,H+H)
Acid-base deficit: 11 mmol/L — ABNORMAL HIGH (ref 0.0–2.0)
Acid-base deficit: 12 mmol/L — ABNORMAL HIGH (ref 0.0–2.0)
Bicarbonate: 18.2 mmol/L — ABNORMAL LOW (ref 20.0–28.0)
Bicarbonate: 19.6 mmol/L — ABNORMAL LOW (ref 20.0–28.0)
Calcium, Ion: 1.01 mmol/L — ABNORMAL LOW (ref 1.15–1.40)
Calcium, Ion: 1.06 mmol/L — ABNORMAL LOW (ref 1.15–1.40)
HCT: 39 % (ref 39.0–52.0)
HCT: 41 % (ref 39.0–52.0)
Hemoglobin: 13.3 g/dL (ref 13.0–17.0)
Hemoglobin: 13.9 g/dL (ref 13.0–17.0)
O2 Saturation: 93 %
O2 Saturation: 94 %
Patient temperature: 35.4
Patient temperature: 96
Potassium: 4.3 mmol/L (ref 3.5–5.1)
Potassium: 4.4 mmol/L (ref 3.5–5.1)
Sodium: 129 mmol/L — ABNORMAL LOW (ref 135–145)
Sodium: 130 mmol/L — ABNORMAL LOW (ref 135–145)
TCO2: 20 mmol/L — ABNORMAL LOW (ref 22–32)
TCO2: 22 mmol/L (ref 22–32)
pCO2 arterial: 52.4 mmHg — ABNORMAL HIGH (ref 32.0–48.0)
pCO2 arterial: 62.2 mmHg — ABNORMAL HIGH (ref 32.0–48.0)
pH, Arterial: 7.098 — CL (ref 7.350–7.450)
pH, Arterial: 7.139 — CL (ref 7.350–7.450)
pO2, Arterial: 87 mmHg (ref 83.0–108.0)
pO2, Arterial: 87 mmHg (ref 83.0–108.0)

## 2021-03-24 LAB — LACTIC ACID, PLASMA
Lactic Acid, Venous: 6.5 mmol/L (ref 0.5–1.9)
Lactic Acid, Venous: 5.9 mmol/L (ref 0.5–1.9)

## 2021-03-24 LAB — COMPREHENSIVE METABOLIC PANEL
ALT: 53 U/L — ABNORMAL HIGH (ref 0–44)
AST: 90 U/L — ABNORMAL HIGH (ref 15–41)
Albumin: 2.3 g/dL — ABNORMAL LOW (ref 3.5–5.0)
Alkaline Phosphatase: 262 U/L — ABNORMAL HIGH (ref 38–126)
Anion gap: 15 (ref 5–15)
BUN: 64 mg/dL — ABNORMAL HIGH (ref 6–20)
CO2: 16 mmol/L — ABNORMAL LOW (ref 22–32)
Calcium: 8 mg/dL — ABNORMAL LOW (ref 8.9–10.3)
Chloride: 94 mmol/L — ABNORMAL LOW (ref 98–111)
Creatinine, Ser: 2.41 mg/dL — ABNORMAL HIGH (ref 0.61–1.24)
GFR, Estimated: 30 mL/min — ABNORMAL LOW (ref >60)
Glucose, Bld: 69 mg/dL — ABNORMAL LOW (ref 70–99)
Potassium: 3.6 mmol/L (ref 3.5–5.1)
Sodium: 125 mmol/L — ABNORMAL LOW (ref 135–145)
Total Bilirubin: 4.4 mg/dL — ABNORMAL HIGH (ref 0.3–1.2)
Total Protein: 6.2 g/dL — ABNORMAL LOW (ref 6.5–8.1)

## 2021-03-24 LAB — GLUCOSE, CAPILLARY
Glucose-Capillary: 98 mg/dL (ref 70–99)
Glucose-Capillary: 103 mg/dL — ABNORMAL HIGH (ref 70–99)
Glucose-Capillary: 74 mg/dL (ref 70–99)
Glucose-Capillary: 264 mg/dL — ABNORMAL HIGH (ref 70–99)
Glucose-Capillary: 35 mg/dL — CL (ref 70–99)

## 2021-03-24 LAB — BLOOD CULTURE ID PANEL (REFLEXED) - BCID2

## 2021-03-24 LAB — BLOOD GAS, ARTERIAL
Acid-base deficit: 13 mmol/L — ABNORMAL HIGH (ref 0.0–2.0)
Bicarbonate: 12 mmol/L — ABNORMAL LOW (ref 20.0–28.0)
Drawn by: 42783
FIO2: 100
O2 Saturation: 93.4 %
Patient temperature: 37
pCO2 arterial: 24.6 mmHg — ABNORMAL LOW (ref 32.0–48.0)
pH, Arterial: 7.311 — ABNORMAL LOW (ref 7.350–7.450)
pO2, Arterial: 79.1 mmHg — ABNORMAL LOW (ref 83.0–108.0)

## 2021-03-24 LAB — MAGNESIUM
Magnesium: 1.8 mg/dL (ref 1.7–2.4)
Magnesium: 2.1 mg/dL (ref 1.7–2.4)

## 2021-03-24 LAB — CBG MONITORING, ED
Glucose-Capillary: 51 mg/dL — ABNORMAL LOW (ref 70–99)
Glucose-Capillary: 102 mg/dL — ABNORMAL HIGH (ref 70–99)

## 2021-03-24 LAB — TRIGLYCERIDES: Triglycerides: 206 mg/dL — ABNORMAL HIGH (ref <150)

## 2021-03-24 LAB — EXPECTORATED SPUTUM ASSESSMENT W GRAM STAIN, RFLX TO RESP C

## 2021-03-24 LAB — RESP PANEL BY RT-PCR (FLU A&B, COVID) ARPGX2
Influenza A by PCR: NEGATIVE
Influenza B by PCR: NEGATIVE
SARS Coronavirus 2 by RT PCR: POSITIVE — AB

## 2021-03-24 LAB — PROTIME-INR
INR: 1.3 — ABNORMAL HIGH (ref 0.8–1.2)
Prothrombin Time: 16.5 seconds — ABNORMAL HIGH (ref 11.4–15.2)

## 2021-03-24 LAB — HIV ANTIBODY (ROUTINE TESTING W REFLEX): HIV Screen 4th Generation wRfx: NONREACTIVE

## 2021-03-24 LAB — CORTISOL: Cortisol, Plasma: 52.4 ug/dL

## 2021-03-24 LAB — TROPONIN I (HIGH SENSITIVITY)
Troponin I (High Sensitivity): 7 ng/L (ref <18)
Troponin I (High Sensitivity): 6 ng/L (ref <18)

## 2021-03-24 LAB — C-REACTIVE PROTEIN: CRP: 25.8 mg/dL — ABNORMAL HIGH (ref <1.0)

## 2021-03-24 LAB — STREP PNEUMONIAE URINARY ANTIGEN: Strep Pneumo Urinary Antigen: POSITIVE — AB

## 2021-03-24 LAB — PROCALCITONIN: Procalcitonin: 78.73 ng/mL

## 2021-03-24 LAB — APTT: aPTT: 33 seconds (ref 24–36)

## 2021-03-24 LAB — BRAIN NATRIURETIC PEPTIDE: B Natriuretic Peptide: 198.7 pg/mL — ABNORMAL HIGH (ref 0.0–100.0)

## 2021-03-24 LAB — PHOSPHORUS
Phosphorus: 4.8 mg/dL — ABNORMAL HIGH (ref 2.5–4.6)
Phosphorus: 7.9 mg/dL — ABNORMAL HIGH (ref 2.5–4.6)

## 2021-03-24 LAB — LACTATE DEHYDROGENASE: LDH: 203 U/L — ABNORMAL HIGH (ref 98–192)

## 2021-03-24 LAB — FERRITIN: Ferritin: 529 ng/mL — ABNORMAL HIGH (ref 24–336)

## 2021-03-24 LAB — FIBRINOGEN: Fibrinogen: 557 mg/dL — ABNORMAL HIGH (ref 210–475)

## 2021-03-24 LAB — CREATININE, SERUM
Creatinine, Ser: 1.79 mg/dL — ABNORMAL HIGH (ref 0.61–1.24)
GFR, Estimated: 43 mL/min — ABNORMAL LOW (ref >60)

## 2021-03-24 LAB — D-DIMER, QUANTITATIVE: D-Dimer, Quant: 2.97 ug/mL-FEU — ABNORMAL HIGH (ref 0.00–0.50)

## 2021-03-24 MED ORDER — DEXMEDETOMIDINE HCL IN NACL 400 MCG/100ML IV SOLN
0.4000 ug/kg/h | INTRAVENOUS | Status: DC
  Filled 2021-03-24: qty 100

## 2021-03-24 MED ORDER — ARTIFICIAL TEARS OPHTHALMIC OINT
1.0000 "application " | TOPICAL_OINTMENT | Freq: Three times a day (TID) | OPHTHALMIC | Status: DC
  Administered 2021-03-24 – 2021-03-31 (×18): 1 via OPHTHALMIC
  Filled 2021-03-24 (×4): qty 3.5

## 2021-03-24 MED ORDER — ALBUTEROL SULFATE HFA 108 (90 BASE) MCG/ACT IN AERS
1.0000 | INHALATION_SPRAY | Freq: Four times a day (QID) | RESPIRATORY_TRACT | Status: DC | PRN
  Filled 2021-03-24: qty 6.7

## 2021-03-24 MED ORDER — PROPOFOL 1000 MG/100ML IV EMUL
0.0000 ug/kg/min | INTRAVENOUS | Status: DC
  Administered 2021-03-24 (×2): 10 ug/kg/min via INTRAVENOUS
  Filled 2021-03-24 (×3): qty 100

## 2021-03-24 MED ORDER — LACTATED RINGERS IV BOLUS (SEPSIS)
1000.0000 mL | Freq: Once | INTRAVENOUS | Status: AC
  Administered 2021-03-24: 1000 mL via INTRAVENOUS

## 2021-03-24 MED ORDER — FENTANYL CITRATE (PF) 100 MCG/2ML IJ SOLN: 50.0000 ug | Freq: Once | INTRAMUSCULAR | Status: AC

## 2021-03-24 MED ORDER — ETOMIDATE 2 MG/ML IV SOLN
INTRAVENOUS | Status: AC
  Administered 2021-03-24: 20 mg via INTRAVENOUS
  Filled 2021-03-24: qty 20

## 2021-03-24 MED ORDER — NOREPINEPHRINE 4 MG/250ML-% IV SOLN
INTRAVENOUS | Status: AC
  Administered 2021-03-24: 2 ug/min via INTRAVENOUS
  Filled 2021-03-24: qty 250

## 2021-03-24 MED ORDER — NOREPINEPHRINE 16 MG/250ML-% IV SOLN
0.0000 ug/min | INTRAVENOUS | Status: DC
  Administered 2021-03-24: 4 ug/min via INTRAVENOUS
  Administered 2021-03-25: 31 ug/min via INTRAVENOUS
  Administered 2021-03-25: 22 ug/min via INTRAVENOUS
  Administered 2021-03-25: 36 ug/min via INTRAVENOUS
  Administered 2021-03-26: 26 ug/min via INTRAVENOUS
  Filled 2021-03-24 (×5): qty 250

## 2021-03-24 MED ORDER — HEPARIN SODIUM (PORCINE) 5000 UNIT/ML IJ SOLN
5000.0000 [IU] | Freq: Three times a day (TID) | INTRAMUSCULAR | Status: DC
  Administered 2021-03-24 – 2021-03-26 (×6): 5000 [IU] via SUBCUTANEOUS
  Filled 2021-03-24 (×6): qty 1

## 2021-03-24 MED ORDER — SODIUM CHLORIDE 0.9 % IV SOLN
2.0000 g | Freq: Two times a day (BID) | INTRAVENOUS | Status: DC
  Administered 2021-03-24 (×2): 2 g via INTRAVENOUS
  Filled 2021-03-24 (×3): qty 2

## 2021-03-24 MED ORDER — VANCOMYCIN VARIABLE DOSE PER UNSTABLE RENAL FUNCTION (PHARMACIST DOSING): Status: DC

## 2021-03-24 MED ORDER — BARICITINIB 1 MG PO TABS
2.0000 mg | ORAL_TABLET | Freq: Every day | ORAL | Status: DC
  Filled 2021-03-24: qty 1

## 2021-03-24 MED ORDER — CHLORHEXIDINE GLUCONATE 0.12% ORAL RINSE (MEDLINE KIT)
15.0000 mL | Freq: Two times a day (BID) | OROMUCOSAL | Status: DC
  Administered 2021-03-24 – 2021-03-25 (×2): 15 mL via OROMUCOSAL

## 2021-03-24 MED ORDER — SODIUM BICARBONATE 8.4 % IV SOLN: 50.0000 meq | Freq: Once | INTRAVENOUS | Status: AC

## 2021-03-24 MED ORDER — ROCURONIUM BROMIDE 50 MG/5ML IV SOLN
70.0000 mg | Freq: Once | INTRAVENOUS | Status: AC
  Filled 2021-03-24: qty 7

## 2021-03-24 MED ORDER — PROSOURCE TF PO LIQD
45.0000 mL | Freq: Two times a day (BID) | ORAL | Status: DC
  Filled 2021-03-24: qty 45

## 2021-03-24 MED ORDER — BARICITINIB 1 MG PO TABS: 1.0000 mg | ORAL_TABLET | Freq: Every day | ORAL | Status: DC

## 2021-03-24 MED ORDER — ROCURONIUM BROMIDE 10 MG/ML (PF) SYRINGE
50.0000 mg | PREFILLED_SYRINGE | INTRAVENOUS | Status: DC | PRN
  Administered 2021-03-28: 50 mg via INTRAVENOUS
  Filled 2021-03-24: qty 5

## 2021-03-24 MED ORDER — ROCURONIUM BROMIDE 10 MG/ML (PF) SYRINGE
PREFILLED_SYRINGE | INTRAVENOUS | Status: AC
  Administered 2021-03-24: 70 mg via INTRAVENOUS
  Filled 2021-03-24: qty 10

## 2021-03-24 MED ORDER — MIDAZOLAM HCL 2 MG/2ML IJ SOLN: 1.0000 mg | Freq: Once | INTRAMUSCULAR | Status: AC

## 2021-03-24 MED ORDER — SODIUM CHLORIDE 0.9 % IV SOLN
2.0000 g | INTRAVENOUS | Status: DC
  Administered 2021-03-25: 2 g via INTRAVENOUS
  Filled 2021-03-24: qty 20

## 2021-03-24 MED ORDER — SODIUM CHLORIDE 0.9% FLUSH: 10.0000 mL | INTRAVENOUS | Status: DC | PRN

## 2021-03-24 MED ORDER — SODIUM BICARBONATE 8.4 % IV SOLN
INTRAVENOUS | Status: AC
  Administered 2021-03-24: 50 meq via INTRAVENOUS
  Filled 2021-03-24: qty 150

## 2021-03-24 MED ORDER — VITAL HIGH PROTEIN PO LIQD: 1000.0000 mL | ORAL | Status: DC

## 2021-03-24 MED ORDER — ORAL CARE MOUTH RINSE: 15.0000 mL | OROMUCOSAL | Status: DC

## 2021-03-24 MED ORDER — DEXTROSE 50 % IV SOLN
INTRAVENOUS | Status: AC
  Administered 2021-03-24: 50 mL via INTRAVENOUS
  Filled 2021-03-24: qty 50

## 2021-03-24 MED ORDER — POLYETHYLENE GLYCOL 3350 17 G PO PACK
17.0000 g | PACK | Freq: Every day | ORAL | Status: DC | PRN
Start: 2021-03-24 — End: 2021-03-25

## 2021-03-24 MED ORDER — DEXAMETHASONE SODIUM PHOSPHATE 10 MG/ML IJ SOLN
6.0000 mg | INTRAMUSCULAR | Status: AC
  Administered 2021-03-25 – 2021-04-02 (×9): 6 mg via INTRAVENOUS
  Filled 2021-03-24 (×9): qty 1

## 2021-03-24 MED ORDER — NOREPINEPHRINE 4 MG/250ML-% IV SOLN
2.0000 ug/min | INTRAVENOUS | Status: DC
Start: 2021-03-24 — End: 2021-03-24
  Administered 2021-03-24: 16 ug/min via INTRAVENOUS
  Filled 2021-03-24: qty 250

## 2021-03-24 MED ORDER — DEXTROSE 50 % IV SOLN
50.0000 mL | Freq: Once | INTRAVENOUS | Status: AC
  Administered 2021-03-24: 50 mL via INTRAVENOUS
  Filled 2021-03-24: qty 50

## 2021-03-24 MED ORDER — CHLORHEXIDINE GLUCONATE 0.12% ORAL RINSE (MEDLINE KIT)
15.0000 mL | Freq: Two times a day (BID) | OROMUCOSAL | Status: DC
  Administered 2021-03-24: 15 mL via OROMUCOSAL

## 2021-03-24 MED ORDER — SODIUM CHLORIDE 0.9 % IV SOLN
200.0000 mg | Freq: Once | INTRAVENOUS | Status: AC
  Administered 2021-03-24: 200 mg via INTRAVENOUS
  Filled 2021-03-24: qty 40

## 2021-03-24 MED ORDER — DOCUSATE SODIUM 50 MG/5ML PO LIQD
100.0000 mg | Freq: Two times a day (BID) | ORAL | Status: DC
  Administered 2021-03-24 – 2021-04-01 (×15): 100 mg
  Filled 2021-03-24 (×15): qty 10

## 2021-03-24 MED ORDER — FENTANYL BOLUS VIA INFUSION
50.0000 ug | INTRAVENOUS | Status: DC | PRN
  Administered 2021-03-24: 100 ug via INTRAVENOUS
  Administered 2021-03-24: 50 ug via INTRAVENOUS
  Administered 2021-03-25 – 2021-04-03 (×5): 100 ug via INTRAVENOUS
  Filled 2021-03-24: qty 100

## 2021-03-24 MED ORDER — MOMETASONE FURO-FORMOTEROL FUM 200-5 MCG/ACT IN AERO
2.0000 | INHALATION_SPRAY | Freq: Two times a day (BID) | RESPIRATORY_TRACT | Status: DC
  Filled 2021-03-24: qty 8.8

## 2021-03-24 MED ORDER — SODIUM CHLORIDE 0.9 % IV SOLN
500.0000 mg | INTRAVENOUS | Status: DC
  Administered 2021-03-24: 500 mg via INTRAVENOUS
  Filled 2021-03-24: qty 500

## 2021-03-24 MED ORDER — DEXTROSE 50 % IV SOLN: 1.0000 | Freq: Once | INTRAVENOUS | Status: AC

## 2021-03-24 MED ORDER — FENTANYL 2500MCG IN NS 250ML (10MCG/ML) PREMIX INFUSION
50.0000 ug/h | INTRAVENOUS | Status: DC
  Administered 2021-03-24 (×2): 100 ug/h via INTRAVENOUS
  Administered 2021-03-25 – 2021-03-31 (×12): 200 ug/h via INTRAVENOUS
  Administered 2021-03-31: 150 ug/h via INTRAVENOUS
  Administered 2021-04-01: 100 ug/h via INTRAVENOUS
  Administered 2021-04-03: 75 ug/h via INTRAVENOUS
  Filled 2021-03-24 (×18): qty 250

## 2021-03-24 MED ORDER — VANCOMYCIN HCL 1750 MG/350ML IV SOLN
1750.0000 mg | Freq: Once | INTRAVENOUS | Status: AC
  Administered 2021-03-24: 1750 mg via INTRAVENOUS
  Filled 2021-03-24: qty 350

## 2021-03-24 MED ORDER — ONDANSETRON HCL 4 MG/2ML IJ SOLN: 4.0000 mg | Freq: Four times a day (QID) | INTRAMUSCULAR | Status: DC | PRN

## 2021-03-24 MED ORDER — FENTANYL CITRATE (PF) 100 MCG/2ML IJ SOLN
50.0000 ug | Freq: Once | INTRAMUSCULAR | Status: AC
Start: 2021-03-24 — End: 2021-03-24

## 2021-03-24 MED ORDER — DEXAMETHASONE SODIUM PHOSPHATE 10 MG/ML IJ SOLN
6.0000 mg | Freq: Once | INTRAMUSCULAR | Status: AC
  Administered 2021-03-24: 6 mg via INTRAVENOUS
  Filled 2021-03-24: qty 1

## 2021-03-24 MED ORDER — FENTANYL CITRATE (PF) 100 MCG/2ML IJ SOLN
INTRAMUSCULAR | Status: AC
  Administered 2021-03-24: 50 ug via INTRAVENOUS
  Filled 2021-03-24: qty 2

## 2021-03-24 MED ORDER — FREE WATER
200.0000 mL | Freq: Three times a day (TID) | Status: DC
  Administered 2021-03-24: 200 mL

## 2021-03-24 MED ORDER — ETOMIDATE 2 MG/ML IV SOLN: 20.0000 mg | Freq: Once | INTRAVENOUS | Status: AC

## 2021-03-24 MED ORDER — DOCUSATE SODIUM 100 MG PO CAPS: 100.0000 mg | ORAL_CAPSULE | Freq: Two times a day (BID) | ORAL | Status: DC | PRN

## 2021-03-24 MED ORDER — LACTATED RINGERS IV BOLUS
1000.0000 mL | Freq: Once | INTRAVENOUS | Status: AC
  Administered 2021-03-24: 1000 mL via INTRAVENOUS

## 2021-03-24 MED ORDER — MIDAZOLAM HCL 2 MG/2ML IJ SOLN
INTRAMUSCULAR | Status: AC
  Administered 2021-03-24: 1 mg via INTRAVENOUS
  Filled 2021-03-24: qty 2

## 2021-03-24 MED ORDER — BARICITINIB 2 MG PO TABS: 4.0000 mg | ORAL_TABLET | Freq: Every day | ORAL | Status: DC

## 2021-03-24 MED ORDER — REMDESIVIR 100 MG IV SOLR
100.0000 mg | Freq: Every day | INTRAVENOUS | Status: AC
  Administered 2021-03-25 – 2021-03-28 (×4): 100 mg via INTRAVENOUS
  Filled 2021-03-24 (×4): qty 20

## 2021-03-24 MED ORDER — VASOPRESSIN 20 UNITS/100 ML INFUSION FOR SHOCK
0.0400 [IU]/min | INTRAVENOUS | Status: DC
  Administered 2021-03-24 – 2021-03-25 (×3): 0.03 [IU]/min via INTRAVENOUS
  Administered 2021-03-26: 0.04 [IU]/min via INTRAVENOUS
  Administered 2021-03-26: 0.03 [IU]/min via INTRAVENOUS
  Administered 2021-03-26 – 2021-03-30 (×10): 0.04 [IU]/min via INTRAVENOUS
  Filled 2021-03-24: qty 100
  Filled 2021-03-24: qty 200
  Filled 2021-03-24 (×8): qty 100
  Filled 2021-03-24: qty 200
  Filled 2021-03-24 (×4): qty 100

## 2021-03-24 MED ORDER — CEFTRIAXONE SODIUM 2 G IJ SOLR
2.0000 g | INTRAMUSCULAR | Status: DC
Start: 2021-03-24 — End: 2021-03-24
  Administered 2021-03-24: 2 g via INTRAVENOUS
  Filled 2021-03-24: qty 20

## 2021-03-24 MED ORDER — PANTOPRAZOLE SODIUM 40 MG IV SOLR
40.0000 mg | Freq: Every day | INTRAVENOUS | Status: DC
  Administered 2021-03-24 – 2021-03-30 (×7): 40 mg via INTRAVENOUS
  Filled 2021-03-24 (×7): qty 40

## 2021-03-24 MED ORDER — ORAL CARE MOUTH RINSE
15.0000 mL | OROMUCOSAL | Status: DC
  Administered 2021-03-24 – 2021-04-06 (×124): 15 mL via OROMUCOSAL

## 2021-03-24 MED ORDER — SODIUM CHLORIDE 0.9 % IV SOLN
250.0000 mL | INTRAVENOUS | Status: DC
  Administered 2021-03-24 – 2021-03-26 (×2): 250 mL via INTRAVENOUS

## 2021-03-24 MED ORDER — SODIUM CHLORIDE 0.9 % IV SOLN: INTRAVENOUS | Status: DC | PRN

## 2021-03-24 MED ORDER — LACTATED RINGERS IV SOLN: INTRAVENOUS | Status: DC

## 2021-03-24 MED ORDER — CHLORHEXIDINE GLUCONATE CLOTH 2 % EX PADS
6.0000 | MEDICATED_PAD | Freq: Every day | CUTANEOUS | Status: DC
  Administered 2021-03-25 – 2021-04-05 (×10): 6 via TOPICAL

## 2021-03-24 MED ORDER — SODIUM CHLORIDE 0.9% FLUSH
10.0000 mL | Freq: Two times a day (BID) | INTRAVENOUS | Status: DC
  Administered 2021-03-25 – 2021-04-03 (×16): 10 mL
  Administered 2021-04-03: 20 mL
  Administered 2021-04-04 – 2021-04-05 (×4): 10 mL

## 2021-03-24 NOTE — ED Notes (Signed)
Notified MD about levo being at 47mcg/min and pt still hypotensive. MD to adjust titration parameters per MD.

## 2021-03-24 NOTE — Plan of Care (Signed)
  Problem: Health Behavior/Discharge Planning: Goal: Ability to manage health-related needs will improve Outcome: Not Progressing   

## 2021-03-24 NOTE — ED Notes (Signed)
MD at bedside. 

## 2021-03-24 NOTE — ED Notes (Addendum)
Pt c/o "feeling bad and I'm just tired". Pt still w/ labored breathing. Pt removed [place]. Educated pt on need to keep that in place. Pt verbalized understanding at this time. Notified MD

## 2021-03-24 NOTE — Progress Notes (Signed)
eLink Physician-Brief Progress Note Patient Name: Edwin West DOB: 01-Jan-1961 MRN: [id number]   Date of Service  04-01-21  HPI/Events of Note  Patient with respiratory acidosis, RT has made ventilator changes per protocol.  eICU Interventions  ABG ordered for midnight.        Thomasene Lot Mitchael Luckey 04/01/2021, 10:34 PM

## 2021-03-24 NOTE — H&P (Addendum)
NAME:  Edwin West, MRN:  [id number], DOB:  1960-09-27, LOS: 0 ADMISSION DATE:  04/01/2021, CONSULTATION DATE:  04/01/2021 REFERRING MD:  Regenia Skeeter, CHIEF COMPLAINT:  Respiratory Failure/ Shock 2/2 Covid 19   History of Present Illness:  60 year old male presents to the Ed 8/16 with shortness of breath.  He states dyspnea  started yesterday 8/15.  He is having some cough and he tested himself at home and was positive for COVID.  He is not sure if he has had a fever.  He is not having any chest pain but is having severe dyspnea and his O2 sats were in the low 80s on arrival on room air. He has not been vaccinated against covid. He has a history of cirrhosis but states he has not drank any time recently. Upon arrival to ED he was in atrial fibrillation per monitor , hypotensive and tachycardic.  In the ED ABG was 7.311/24.6/79.1/12/Saturation 93.4. Oxygen was titrated to 40 L HHF. Sats are 93%. CXR revealed extensive confluent opacity throughout the right mid lung and patchy opacities in the left lung concerning for COVID pneumonitis. R>L. Lactate was 6.5, Pt. Is currently receiving his 3 L bolus per sepsis protocol, is on Levophed at 16 mcg/ min and BP remains soft. Na 125, K 3.6, glucose was 69, creatinine 2.41( 0.87 in June) , Alk Phos of 262, AST 90, ALT of 53, Total Bili 4.4, LDH 203, Troponin 7, Triglycerides 206, Ferritin 529, CRP 25.8. He is neutropenic with WBC of 0.9 and HGB 14.6, platelets are 72,000 He is hypothermic with rectal temp of 95 F  PCCM have been consulted to admit to ICU and manage care     Pertinent  Medical History   Past Medical History:  Diagnosis Date   Cirrhosis of liver ([place]) 04/2018     Significant Hospital Events: Including procedures, antibiotic start and stop dates in addition to other pertinent events   8/16>> Admission to [facility]   Interim History / Subjective:  States he is short of breath. Feels bad. Working hard to breath    Objective   Blood  pressure (!) 92/56, pulse (!) 121, temperature (!) 95 F (35 C), temperature source Rectal, resp. rate (!) 22, height _0  (1.753 m), weight 89.4 kg, SpO2 93 %.    FiO2 (%):  [100 %] 100 %   Intake/Output Summary (Last 24 hours) at 03/17/2021 1034 Last data filed at 03/13/2021 1029 Gross per 24 hour  Intake 1385.61 ml  Output 150 ml  Net 1235.61 ml   Filed Weights   04/04/2021 0755  Weight: 89.4 kg    Examination: General: Awake and alert, In moderate respiratory distress HENT: NCAT, MM dry, No LAD, HFNC in place Lungs: Bilateral chest excursion, Rhonchi throughout, diminished per bases Cardiovascular: S1, S2, Irr, tachy, A fib per tele Abdomen: Soft  NT, slightly distended, Diarrhea that is clear Extremities: Cool to touch, hypothermic on [employer], no mottling at present  Neuro: Awake and alert, MAE x 4, Appropriate at present GU: NA, needs foley cath placed  Resolved Hospital Problem list     Assessment & Plan:  Acute Hypoxic Respiratory Failure 2/2 Covid 19 Pneumonia vs superimposed bacterial infection Currently on HHFNC at 40 L.  Plan  Titrate oxygen for sats> 92% Promote self proning Low threshold for intubation with AMS, increased work of breathing  Trend CXR daily ABG upon arrival to ICU in am and prn  Decadron 6 mg daily x 10 days Most likely  will need intubation today   Sepsis / Shock  2/2 Covid Pneumonia Neutropenia Severe lactic Acidosis Plan Fluid resuscitate 3 L IVF as ordered. Trend Lactate Titrate Levophed for MAP > 65 2 D Echo  Trend Troponin's Sputum Culture as ordered, follow results Blood Cx, Follow micro UA Urine for Legionella / Strep Remdesivir / Baricitinib Trend  Ferritin, D dimer, PCT and CRP Hold Home Lasix and Aldactone  AKI  Plan Fluid as noted above  Trend BMET Replete electrolytes as needed  Renal US Foley Cath with Strict I&O  Thrombocytopenia most likely 2/2 Sepsis Plan Trend CBC Monitor for bleeding  Transfuse as  needed for bleeding  Atrial Fibrillation  Tachy most likely 2/2 dehydration Plan Monitor    Hypothermia Temp of 95 on admission Plan [place] with goal of normothermia Treat underlying cause  Cirrhosis Elevated Alk Phos Plan Trend LFT's Check ionized calcium        Best Practice (right click and "Reselect all SmartList Selections" daily)   Diet/type: NPO DVT prophylaxis: prophylactic heparin  GI prophylaxis: PPI Lines: Will need today Foley:  Ordered for strict I&O Code Status:  full code Last date of multidisciplinary goals of care discussion [Pending]  Labs   CBC: Recent Labs  Lab 04/04/2021 0757  WBC 0.9*  NEUTROABS 0.5*  HGB 14.6  HCT 42.9  MCV 105.4*  PLT 72*    Basic Metabolic Panel: Recent Labs  Lab 03/19/2021 0757  NA 125*  K 3.6  CL 94*  CO2 16*  GLUCOSE 69*  BUN 64*  CREATININE 2.41*  CALCIUM 8.0*   GFR: Estimated Creatinine Clearance: 37 mL/min (A) (by C-G formula based on SCr of 2.41 mg/dL (H)). Recent Labs  Lab 03/09/2021 0757  WBC 0.9*  LATICACIDVEN 6.5*    Liver Function Tests: Recent Labs  Lab 03/22/2021 0757  AST 90*  ALT 53*  ALKPHOS 262*  BILITOT 4.4*  PROT 6.2*  ALBUMIN 2.3*   No results for input(s): LIPASE, AMYLASE in the last 168 hours. No results for input(s): AMMONIA in the last 168 hours.  ABG    Component Value Date/Time   PHART 7.311 (L) 03/10/2021 0832   PCO2ART 24.6 (L) 04/02/2021 0832   PO2ART 79.1 (L) 03/14/2021 0832   HCO3 12.0 (L) 04/08/2021 0832   ACIDBASEDEF 13.0 (H) 03/16/2021 0832   O2SAT 93.4 04/05/2021 0832     Coagulation Profile: Recent Labs  Lab 04/07/2021 0757  INR 1.3*    Cardiac Enzymes: No results for input(s): CKTOTAL, CKMB, CKMBINDEX, TROPONINI in the last 168 hours.  HbA1C: No results found for: HGBA1C  CBG: Recent Labs  Lab 04/05/2021 0946  GLUCAP 51*    Review of Systems:   + for Cough with discolored sputum Diarrhea Dyspnea  Past Medical History:  He,   has a past medical history of Cirrhosis of liver ([place]) (04/2018).   Surgical History:   Past Surgical History:  Procedure Laterality Date   COLONOSCOPY WITH PROPOFOL N/A 06/16/2018   Procedure: COLONOSCOPY WITH PROPOFOL;  Surgeon: Virgel Manifold, MD;  Location: [facility] ENDOSCOPY;  Service: Endoscopy;  Laterality: N/A;   ESOPHAGOGASTRODUODENOSCOPY (EGD) WITH PROPOFOL N/A 06/16/2018   Procedure: ESOPHAGOGASTRODUODENOSCOPY (EGD) WITH PROPOFOL;  Surgeon: Virgel Manifold, MD;  Location: [facility] ENDOSCOPY;  Service: Endoscopy;  Laterality: N/A;   GIVENS CAPSULE STUDY N/A 07/12/2018   Procedure: GIVENS CAPSULE STUDY;  Surgeon: Virgel Manifold, MD;  Location: [facility] ENDOSCOPY;  Service: Endoscopy;  Laterality: N/A;     Social History:  reports that he has been smoking cigarettes. He has a 21.50 pack-year smoking history. He has quit using smokeless tobacco.  His smokeless tobacco use included chew. He reports that he does not currently use alcohol. He reports that he does not currently use drugs after having used the following drugs: Marijuana and Oxycodone.   Family History:  His family history includes Breast cancer (age of onset: 45) in an other family member; Cervical cancer in his sister; Heart failure in his sister and another family member; Hypertension in his father; Stroke in his brother.   Allergies No Known Allergies   Home Medications  Prior to Admission medications   Medication Sig Start Date End Date Taking? Authorizing Provider  albuterol (VENTOLIN HFA) 108 (90 Base) MCG/ACT inhaler TAKE 2 PUFFS BY MOUTH EVERY 6 HOURS AS NEEDED FOR WHEEZE OR SHORTNESS OF BREATH Patient taking differently: Inhale 2 puffs into the lungs every 6 (six) hours as needed for wheezing or shortness of breath. 02/16/21   Lesleigh Noe, MD  Fluticasone-Salmeterol (ADVAIR DISKUS) 250-50 MCG/DOSE AEPB Inhale 1 puff into the lungs 2 (two) times daily. 11/10/20   Lesleigh Noe, MD  furosemide (LASIX) 20  MG tablet Take 2 tablets (40 mg total) by mouth daily. 01/07/21 04/07/21  Virgel Manifold, MD  ibuprofen (ADVIL) 400 MG tablet Take 400 mg by mouth every 6 (six) hours as needed for moderate pain or mild pain.    [provider]  omeprazole (PRILOSEC) 40 MG capsule Take 1 capsule (40 mg total) by mouth daily for 28 days. 01/07/21 05/16/21  Virgel Manifold, MD  spironolactone (ALDACTONE) 100 MG tablet Take 2 tablets (200 mg total) by mouth daily for 90 doses. 01/07/21 04/07/21  Virgel Manifold, MD  traMADol (ULTRAM) 50 MG tablet Take 1 tablet (50 mg total) by mouth every 8 (eight) hours as needed for moderate pain. 01/07/21   Owens Loffler, MD     Critical care time: 41 minutes    Magdalen Spatz, MSN, AGACNP-BC [place] for personal pager PCCM on call pager [phone]  03/27/2021 11:23 AM

## 2021-03-24 NOTE — Progress Notes (Signed)
Elink is following Code Sepsis 

## 2021-03-24 NOTE — Procedures (Signed)
Intubation Procedure Note  Edwin West  [id number]  04/13/1961  Date:03/23/2021  Time:2:19 PM   Provider Performing:Pete E Kary Kos    Procedure: Intubation (08657)  Indication(s) Respiratory Failure  Consent Risks of the procedure as well as the alternatives and risks of each were explained to the patient and/or caregiver.  Consent for the procedure was obtained and is signed in the bedside chart   Anesthesia Etomidate, Versed, Fentanyl, and Rocuronium   Time Out Verified patient identification, verified procedure, site/side was marked, verified correct patient position, special equipment/implants available, medications/allergies/relevant history reviewed, required imaging and test results available.   Sterile Technique Usual hand hygeine, masks, and gloves were used   Procedure Description Patient positioned in bed supine.  Sedation given as noted above.  Patient was intubated with endotracheal tube using  Mac 4 .  View was Grade 3 only epiglottis .  Number of attempts was 1.  Colorimetric CO2 detector was consistent with tracheal placement.   Complications/Tolerance None; patient tolerated the procedure well. Chest X-ray is ordered to verify placement.   EBL Minimal   Specimen(s) None  Erick Colace ACNP-BC [place] Pager # [phone] OR # [phone] if no answer

## 2021-03-24 NOTE — Progress Notes (Signed)
Brief Nutrition Note  Consult received for enteral/tube feeding initiation and management. Pt with OG tube tip in stomach and side port below the GE junction. Abdominal x-ray also notes bowel distension throughout the abdomen. CCM NP okay with starting tube feeding protocol.  Adult Enteral Nutrition Protocol initiated. Full assessment to follow tomorrow, 8/17.  Admitting Dx: Anuria [R34] Acute respiratory failure with hypoxia (HCC) [J96.01] Acute kidney injury (HCC) [N17.9] Septic shock (HCC) [A41.9, R65.21] Pneumonia due to COVID-19 virus [U07.1, J12.82]   Labs:  Recent Labs  Lab 03/23/2021 0757 03/28/2021 1236  NA 125*  --   K 3.6  --   CL 94*  --   CO2 16*  --   BUN 64*  --   CREATININE 2.41* 1.79*  CALCIUM 8.0*  --   MG  --  1.8  PHOS  --  4.8*  GLUCOSE 69*  --     Mertie Clause, MS, RD, LDN Inpatient Clinical Dietitian Please see AMiON for contact information.

## 2021-03-24 NOTE — ED Notes (Signed)
Critical care provider at bedside.  

## 2021-03-24 NOTE — ED Provider Notes (Signed)
[facility] Provider Note   CSN: [id number] Arrival date & time: 04/21/21  0701     History Chief Complaint  Patient presents with   Covid Positive   Shortness of Breath   Respiratory Distress    Edwin West is a 60 y.o. male.  HPI 60 year old male presents with shortness of breath.  He states this started yesterday.  He is having some cough and he tested himself at home and was positive for COVID.  He is not sure if he has had a fever.  He is not having any chest pain but is having severe dyspnea and his O2 sats were in the low 80s on arrival on room air. He has not been vaccinated against covid. He has a history of cirrhosis but states he has not drank any time recently.  Past Medical History:  Diagnosis Date   Cirrhosis of liver (HCC) 04/2018    Patient Active Problem List   Diagnosis Date Noted   Esophageal varices determined by endoscopy (HCC) 01/08/2021   Lumbar radiculopathy, acute 11/10/2020   Emphysema lung (HCC) 09/10/2020   Alcohol abuse, in remission 10/08/2018   Umbilical hernia without obstruction and without gangrene 10/08/2018   Hoarseness of voice 10/08/2018   Tobacco abuse 10/08/2018   Rectal polyp    Esophageal varices in alcoholic cirrhosis (HCC)    Portal hypertension (HCC)    Portal hypertensive gastropathy (HCC)    Alcoholic cirrhosis of liver with ascites (HCC) 05/21/2018   Anemia 05/21/2018    Past Surgical History:  Procedure Laterality Date   COLONOSCOPY WITH PROPOFOL N/A 06/16/2018   Procedure: COLONOSCOPY WITH PROPOFOL;  Surgeon: Pasty Spillersahiliani, Varnita B, MD;  Location: [facility] ENDOSCOPY;  Service: Endoscopy;  Laterality: N/A;   ESOPHAGOGASTRODUODENOSCOPY (EGD) WITH PROPOFOL N/A 06/16/2018   Procedure: ESOPHAGOGASTRODUODENOSCOPY (EGD) WITH PROPOFOL;  Surgeon: Pasty Spillersahiliani, Varnita B, MD;  Location: [facility] ENDOSCOPY;  Service: Endoscopy;  Laterality: N/A;   GIVENS CAPSULE STUDY N/A 07/12/2018   Procedure:  GIVENS CAPSULE STUDY;  Surgeon: Pasty Spillersahiliani, Varnita B, MD;  Location: [facility] ENDOSCOPY;  Service: Endoscopy;  Laterality: N/A;       Family History  Problem Relation Age of Onset   Heart failure Sister    Cervical cancer Sister    Breast cancer Other 3449   Heart failure Other    Hypertension Father    Stroke Brother     Social History   Tobacco Use   Smoking status: Every Day    Packs/day: 0.50    Years: 43.00    Pack years: 21.50    Types: Cigarettes   Smokeless tobacco: Former    Types: Chew   Tobacco comments:    previously smoked more than 1/2 ppd  Vaping Use   Vaping Use: Some days   Substances: Nicotine  Substance Use Topics   Alcohol use: Not Currently    Comment: sober - 2019   Drug use: Not Currently    Types: Marijuana, Oxycodone    Comment: none since 20'    Home Medications Prior to Admission medications   Medication Sig Start Date End Date Taking? Authorizing Provider  albuterol (VENTOLIN HFA) 108 (90 Base) MCG/ACT inhaler TAKE 2 PUFFS BY MOUTH EVERY 6 HOURS AS NEEDED FOR WHEEZE OR SHORTNESS OF BREATH 02/16/21   Lynnda Childody, Jessica R, MD  Fluticasone-Salmeterol (ADVAIR DISKUS) 250-50 MCG/DOSE AEPB Inhale 1 puff into the lungs 2 (two) times daily. 11/10/20   Lynnda Childody, Jessica R, MD  furosemide (LASIX) 20 MG tablet  Take 2 tablets (40 mg total) by mouth daily. 01/07/21 04/07/21  Pasty Spillers, MD  ibuprofen (ADVIL) 400 MG tablet Take 400 mg by mouth every 6 (six) hours as needed for moderate pain or mild pain.    [provider]  omeprazole (PRILOSEC) 40 MG capsule Take 1 capsule (40 mg total) by mouth daily for 28 days. 01/07/21 02/04/21  Pasty Spillers, MD  spironolactone (ALDACTONE) 100 MG tablet Take 2 tablets (200 mg total) by mouth daily for 90 doses. 01/07/21 04/07/21  Pasty Spillers, MD  traMADol (ULTRAM) 50 MG tablet Take 1 tablet (50 mg total) by mouth every 8 (eight) hours as needed for moderate pain. 01/07/21   Hannah Beat, MD    Allergies     Patient has no known allergies.  Review of Systems   Review of Systems  Constitutional:  Negative for fever.  Respiratory:  Positive for cough and shortness of breath.   Cardiovascular:  Positive for leg swelling (mild). Negative for chest pain.  Gastrointestinal:  Positive for diarrhea. Negative for abdominal pain.  All other systems reviewed and are negative.  Physical Exam Updated Vital Signs BP (!) 100/44   Pulse (!) 135   Temp (!) 95 F (35 C) (Rectal) Comment: notified MD  Resp 20   Ht 5\' 9"  (1.753 m)   Wt 89.4 kg   SpO2 93%   BMI 29.11 kg/m   Physical Exam Vitals and nursing note reviewed.  Constitutional:      General: He is in acute distress.     Appearance: He is well-developed. He is ill-appearing. He is not diaphoretic.  HENT:     Head: Normocephalic and atraumatic.     Right Ear: External ear normal.     Left Ear: External ear normal.     Nose: Nose normal.  Eyes:     General:        Right eye: No discharge.        Left eye: No discharge.  Cardiovascular:     Rate and Rhythm: Normal rate and regular rhythm.     Heart sounds: Normal heart sounds.  Pulmonary:     Effort: Pulmonary effort is normal.     Breath sounds: Rales (diffusely, worst in bilateral lower lobes) present.  Abdominal:     Palpations: Abdomen is soft.     Tenderness: There is no abdominal tenderness.  Musculoskeletal:     Cervical back: Neck supple.     Right lower leg: Edema present.     Left lower leg: Edema present.     Comments: Mild lower extremity edema  Skin:    General: Skin is warm and dry.  Neurological:     Mental Status: He is alert.  Psychiatric:        Mood and Affect: Mood is not anxious.    ED Results / Procedures / Treatments   Labs (all labs ordered are listed, but only abnormal results are displayed) Labs Reviewed  RESP PANEL BY RT-PCR (FLU A&B, COVID) ARPGX2 - Abnormal; Notable for the following components:      Result Value   SARS Coronavirus 2 by RT  PCR POSITIVE (*)    All other components within normal limits  LACTIC ACID, PLASMA - Abnormal; Notable for the following components:   Lactic Acid, Venous 6.5 (*)    All other components within normal limits  CBC WITH DIFFERENTIAL/PLATELET - Abnormal; Notable for the following components:   WBC 0.9 (*)  RBC 4.07 (*)    MCV 105.4 (*)    MCH 35.9 (*)    Platelets 72 (*)    Neutro Abs 0.5 (*)    Lymphs Abs 0.3 (*)    All other components within normal limits  COMPREHENSIVE METABOLIC PANEL - Abnormal; Notable for the following components:   Sodium 125 (*)    Chloride 94 (*)    CO2 16 (*)    Glucose, Bld 69 (*)    BUN 64 (*)    Creatinine, Ser 2.41 (*)    Calcium 8.0 (*)    Total Protein 6.2 (*)    Albumin 2.3 (*)    AST 90 (*)    ALT 53 (*)    Alkaline Phosphatase 262 (*)    Total Bilirubin 4.4 (*)    GFR, Estimated 30 (*)    All other components within normal limits  D-DIMER, QUANTITATIVE - Abnormal; Notable for the following components:   D-Dimer, Quant 2.97 (*)    All other components within normal limits  LACTATE DEHYDROGENASE - Abnormal; Notable for the following components:   LDH 203 (*)    All other components within normal limits  FERRITIN - Abnormal; Notable for the following components:   Ferritin 529 (*)    All other components within normal limits  FIBRINOGEN - Abnormal; Notable for the following components:   Fibrinogen 557 (*)    All other components within normal limits  C-REACTIVE PROTEIN - Abnormal; Notable for the following components:   CRP 25.8 (*)    All other components within normal limits  TRIGLYCERIDES - Abnormal; Notable for the following components:   Triglycerides 206 (*)    All other components within normal limits  PROTIME-INR - Abnormal; Notable for the following components:   Prothrombin Time 16.5 (*)    INR 1.3 (*)    All other components within normal limits  BLOOD GAS, ARTERIAL - Abnormal; Notable for the following components:    pH, Arterial 7.311 (*)    pCO2 arterial 24.6 (*)    pO2, Arterial 79.1 (*)    Bicarbonate 12.0 (*)    Acid-base deficit 13.0 (*)    All other components within normal limits  CBG MONITORING, ED - Abnormal; Notable for the following components:   Glucose-Capillary 51 (*)    All other components within normal limits  CULTURE, BLOOD (ROUTINE X 2)  CULTURE, BLOOD (ROUTINE X 2)  APTT  LACTIC ACID, PLASMA  PROCALCITONIN  URINALYSIS, ROUTINE W REFLEX MICROSCOPIC  MISCELLANEOUS GENETIC TEST  TROPONIN I (HIGH SENSITIVITY)  TROPONIN I (HIGH SENSITIVITY)    EKG EKG Interpretation  Date/Time:  Tuesday March 24 2021 08:21:47 EDT Ventricular Rate:  138 PR Interval:    QRS Duration: 104 QT Interval:  319 QTC Calculation: 484 R Axis:   81 Text Interpretation: Atrial fibrillation with RVR Inferior infarct, old Confirmed by Pricilla Loveless [phone]) on 03/11/2021 8:24:49 AM  Radiology DG Chest Port 1 View  Result Date: 03/19/2021 CLINICAL DATA:  Over, hypoxia EXAM: PORTABLE CHEST 1 VIEW COMPARISON:  Chest radiograph 04/03/2018 FINDINGS: The cardiomediastinal silhouette is within normal limits. There is extensive confluent opacity projecting over the right midlung and patchy opacities project over the left midlung. There is no significant pleural effusion. There is no pneumothorax. There is no acute osseous abnormality. IMPRESSION: Extensive confluent opacity throughout the right mid lung and patchy opacities in the left lung concerning for COVID pneumonitis. Recommend follow-up radiographs to resolution. Electronically Signed   By: [name] Arista  Noone M.D.   On: 03/10/2021 08:17    Procedures .Critical Care  Date/Time: 03/29/2021 10:10 AM Performed by: Pricilla Loveless, MD Authorized by: Pricilla Loveless, MD   Critical care provider statement:    Critical care time (minutes):  60   Critical care time was exclusive of:  Separately billable procedures and treating other patients   Critical care was  necessary to treat or prevent imminent or life-threatening deterioration of the following conditions:  Sepsis and respiratory failure   Critical care was time spent personally by me on the following activities:  Discussions with consultants, evaluation of patient's response to treatment, examination of patient, ordering and performing treatments and interventions, ordering and review of laboratory studies, ordering and review of radiographic studies, pulse oximetry, re-evaluation of patient's condition, obtaining history from patient or surrogate and review of old charts   Medications Ordered in ED Medications  cefTRIAXone (ROCEPHIN) 2 g in sodium chloride 0.9 % 100 mL IVPB (0 g Intravenous Stopped 03/11/2021 0926)  azithromycin (ZITHROMAX) 500 mg in sodium chloride 0.9 % 250 mL IVPB ( Intravenous Infusion Verify 03/13/2021 0942)  0.9 %  sodium chloride infusion (0 mLs Intravenous Hold 03/23/2021 0857)  norepinephrine (LEVOPHED) 4mg  in premix infusion (12 mcg/min Intravenous Infusion Verify 04/03/2021 0942)  vancomycin (VANCOREADY) IVPB 1750 mg/350 mL (has no administration in time range)  dextrose 50 % solution 50 mL (50 mLs Intravenous Not Given 03/29/2021 1006)  dexamethasone (DECADRON) injection 6 mg (has no administration in time range)  lactated ringers bolus 1,000 mL (0 mLs Intravenous Stopped 03/16/2021 0912)  lactated ringers bolus 1,000 mL (1,000 mLs Intravenous New Bag/Given 03/11/2021 0853)    And  lactated ringers bolus 1,000 mL (1,000 mLs Intravenous New Bag/Given 03/28/2021 0853)  dextrose 50 % solution 50 mL (50 mLs Intravenous Given 03/11/2021 0950)    ED Course  I have reviewed the triage vital signs and the nursing notes.  Pertinent labs & imaging results that were available during my care of the patient were reviewed by me and considered in my medical decision making (see chart for details).  Clinical Course as of 03/21/2021 1227  Tue Mar 24, 2021  0823 Patient's blood pressure has  significantly dropped.  It was hypotensive to begin with but even more so into the 60s.  It would not be typical for him to be in septic shock from COVID itself so I will also give antibiotics and sepsis fluids.  He is placed on high flow to see if this gives him any better respiratory support. [SG]    Clinical Course User Index [SG] Mar 26, 2021, MD   MDM Rules/Calculators/A&P                           Patient presents quite ill with hypoxia and pneumonia.  He reports that this has been a 2-day process though for how sick he is I wonder if this has been going on longer.  He appears to be in atrial fibrillation though he has never had a history of this and is unclear when exactly that started.  My suspicion is this is reactive to his acute, severe illness.  He is breathing better with better oxygenation and better work of breathing with the high flow nasal cannula.  His x-ray does appear to show pneumonia, whether this is primary COVID or bacterial superinfection as well as hard to tell and so he was given broad antibiotics as above.  He is  currently requiring Levophed to keep his pressure up and is also getting IV fluid resuscitation.  He is found to have significant laboratory abnormalities including neutropenia which is new, acute kidney injury, and severe lactic acidosis.  At this point I do not think he needs to be intubated.  He has 3 IVs.  He will need ICU admission and I have consulted them and they also asked for 6 mg Decadron and remdesivir. Final Clinical Impression(s) / ED Diagnoses Final diagnoses:  Pneumonia due to COVID-19 virus  Acute respiratory failure with hypoxia (HCC)  Septic shock (HCC)  Acute kidney injury [facility])    Rx / DC Orders ED Discharge Orders     None        Pricilla Loveless, MD 03/27/2021 1227

## 2021-03-24 NOTE — Procedures (Signed)
Arterial Catheter Insertion Procedure Note  Edwin West  [id number]  [birth year]  Date:03/29/2021  Time:9:47 PM    Provider Performing: Inez Pilgrim    Procedure: Insertion of Arterial Line (28206) without US guidance  Indication(s) Blood pressure monitoring and/or need for frequent ABGs  Consent Unable to obtain consent due to emergent nature of procedure.  Anesthesia None   Time Out Verified patient identification, verified procedure, site/side was marked, verified correct patient position, special equipment/implants available, medications/allergies/relevant history reviewed, required imaging and test results available.   Sterile Technique Maximal sterile technique including full sterile barrier drape, hand hygiene, sterile gown, sterile gloves, mask, hair covering, sterile ultrasound probe cover (if used).   Procedure Description Area of catheter insertion was cleaned with chlorhexidine and draped in sterile fashion. Without real-time ultrasound guidance an arterial catheter was placed into the right radial artery.  Appropriate arterial tracings confirmed on monitor.     Complications/Tolerance None; patient tolerated the procedure well.   EBL Minimal   Specimen(s) None

## 2021-03-24 NOTE — Progress Notes (Signed)
Pharmacy Antibiotic Note  Edwin West is a 59 y.o. male admitted on 03/18/2021 with sepsis.  Pharmacy has been consulted for vancomycin and meropenem dosing.  Plan: Vancomycin 1750mg  x1 then vanc variable given that Cr is >2mg /dL and is around 24 hours, and unsure which way Cr will be trending given on pressors and overall clinical picture Meropenem 2g IV q12h  -Monitor renal function, clinical status, and antibiotic plan -Order vanc levels as necessary   Height: 5\' 9"  (175.3 cm) Weight: 89.4 kg (197 lb 1.5 oz) IBW/kg (Calculated) : 70.7  Temp (24hrs), Avg:95.7 F (35.4 C), Min:95 F (35 C), Max:96.4 F (35.8 C)  Recent Labs  Lab 03/15/2021 0757  WBC 0.9*  CREATININE 2.41*  LATICACIDVEN 6.5*    Estimated Creatinine Clearance: 37 mL/min (A) (by C-G formula based on SCr of 2.41 mg/dL (H)).    No Known Allergies  Antimic/robials this admission: Vanc 8/16 >>  Mero 8/16 >>  CTX x1 Azithro x1  Dose adjustments this admission: N/A  Microbiology results: 8/16 BCx: pending 8/16 Sputum: pending  Thank you for allowing pharmacy to be a part of this patient's care.  9/16, PharmD, [facility] Emergency Medicine Clinical Pharmacist ED RPh Phone: [phone] Main RX: [phone]

## 2021-03-24 NOTE — Progress Notes (Signed)
Called and updated daughter.  Dan Evita Merida MD PCCM 

## 2021-03-24 NOTE — ED Notes (Signed)
Pt placed on monitoring equipment. IV started and blood obtained. RT and [place] MD notified of pt condition.

## 2021-03-24 NOTE — Progress Notes (Signed)
RT assisted with transportation of this pt from ED to 3M10. RT placed pt back on [facility]NC.

## 2021-03-24 NOTE — Procedures (Signed)
Central Venous Catheter Insertion Procedure Note  Jonothan Heberle  [id number]  07/19/1961  Date:03/23/2021  Time:2:20 PM   Provider Performing:Pete Bea Laura Tanja Port   Procedure: Insertion of Non-tunneled Central Venous Catheter([id number]) without US guidance  Indication(s) Medication administration and Difficult access  Consent Risks of the procedure as well as the alternatives and risks of each were explained to the patient and/or caregiver.  Consent for the procedure was obtained and is signed in the bedside chart  Anesthesia Topical only with 1% lidocaine   Timeout Verified patient identification, verified procedure, site/side was marked, verified correct patient position, special equipment/implants available, medications/allergies/relevant history reviewed, required imaging and test results available.  Sterile Technique Maximal sterile technique including full sterile barrier drape, hand hygiene, sterile gown, sterile gloves, mask, hair covering, sterile ultrasound probe cover (if used).  Procedure Description Area of catheter insertion was cleaned with chlorhexidine and draped in sterile fashion.  Without real-time ultrasound guidance a central venous catheter was placed into the left subclavian vein. Nonpulsatile blood flow and easy flushing noted in all ports.  The catheter was sutured in place and sterile dressing applied.  Complications/Tolerance None; patient tolerated the procedure well. Chest X-ray is ordered to verify placement for internal jugular or subclavian cannulation.   Chest x-ray is not ordered for femoral cannulation.  EBL Minimal  Specimen(s) None  Simonne Martinet ACNP-BC [facility] Pulmonary/Critical Care Pager # [phone] OR # [phone] if no answer

## 2021-03-24 NOTE — ED Notes (Signed)
Pt coughing up moderate amounts of brown sputum.

## 2021-03-24 NOTE — ED Notes (Signed)
Bair Hugger applied to pt.

## 2021-03-24 NOTE — ED Triage Notes (Signed)
Pt reports sob since yesterday, took a home COVID test that was positive. Pt hypoxic in triage (low 80s on room air) NRB at 15L applied. RR labored

## 2021-03-24 NOTE — ED Notes (Signed)
Told by EDT that CBG 51. MD being notified.

## 2021-03-24 NOTE — Progress Notes (Signed)
Changes made based on ABg result.  Elink notified and repeat ABG will be obtained within 30 mins.

## 2021-03-24 NOTE — ED Notes (Signed)
RTat bedside w/ heated hi-flow

## 2021-03-24 NOTE — Progress Notes (Signed)
PHARMACY - PHYSICIAN COMMUNICATION CRITICAL VALUE ALERT - BLOOD CULTURE IDENTIFICATION (BCID)  Edwin West is an 60 y.o. male who presented to [facility] on 04/19/21 with a chief complaint of severe ARDS secondary to COVID  Assessment:  Severely ill, leukopenic   Name of physician (or Provider) Contacted: Dr Warrick Parisian  Current antibiotics: Vancomycin/Merrem  Changes to prescribed antibiotics recommended:  Cont Vancomycin  Change Merrem to Ceftriaxone 2g IV q24h  Results for orders placed or performed during the hospital encounter of 2021-04-19  Blood Culture ID Panel (Reflexed) (Collected: 2021/04/19  8:41 AM)  Result Value Ref Range   Enterococcus faecalis NOT DETECTED NOT DETECTED   Enterococcus Faecium NOT DETECTED NOT DETECTED   Listeria monocytogenes NOT DETECTED NOT DETECTED   Staphylococcus species NOT DETECTED NOT DETECTED   Staphylococcus aureus (BCID) NOT DETECTED NOT DETECTED   Staphylococcus epidermidis NOT DETECTED NOT DETECTED   Staphylococcus lugdunensis NOT DETECTED NOT DETECTED   Streptococcus species DETECTED (A) NOT DETECTED   Streptococcus agalactiae NOT DETECTED NOT DETECTED   Streptococcus pneumoniae DETECTED (A) NOT DETECTED   Streptococcus pyogenes NOT DETECTED NOT DETECTED   A.calcoaceticus-baumannii NOT DETECTED NOT DETECTED   Bacteroides fragilis NOT DETECTED NOT DETECTED   Enterobacterales NOT DETECTED NOT DETECTED   Enterobacter cloacae complex NOT DETECTED NOT DETECTED   Escherichia coli NOT DETECTED NOT DETECTED   Klebsiella aerogenes NOT DETECTED NOT DETECTED   Klebsiella oxytoca NOT DETECTED NOT DETECTED   Klebsiella pneumoniae NOT DETECTED NOT DETECTED   Proteus species NOT DETECTED NOT DETECTED   Salmonella species NOT DETECTED NOT DETECTED   Serratia marcescens NOT DETECTED NOT DETECTED   Haemophilus influenzae NOT DETECTED NOT DETECTED   Neisseria meningitidis NOT DETECTED NOT DETECTED   Pseudomonas aeruginosa NOT DETECTED NOT DETECTED    Stenotrophomonas maltophilia NOT DETECTED NOT DETECTED   Candida albicans NOT DETECTED NOT DETECTED   Candida auris NOT DETECTED NOT DETECTED   Candida glabrata NOT DETECTED NOT DETECTED   Candida krusei NOT DETECTED NOT DETECTED   Candida parapsilosis NOT DETECTED NOT DETECTED   Candida tropicalis NOT DETECTED NOT DETECTED   Cryptococcus neoformans/gattii NOT DETECTED NOT DETECTED    Abran Duke, PharmD, BCPS Clinical Pharmacist Phone: [phone]

## 2021-03-24 NOTE — Progress Notes (Signed)
eLink Physician-Brief Progress Note Patient Name: [name] Cumbie DOB: November 05, 1960 MRN: [id number]   Date of Service  03/16/2021  HPI/Events of Note  Patient with increasing pressor requirements raising concern for persisting respiratory acidosis.  eICU Interventions  Stat ABG ordered.        Thomasene Lot Johnnetta Holstine 03/09/2021, 11:22 PM

## 2021-03-25 ENCOUNTER — Inpatient Hospital Stay (HOSPITAL_COMMUNITY): Payer: Commercial Managed Care - PPO

## 2021-03-25 DIAGNOSIS — I4891 Unspecified atrial fibrillation: Secondary | ICD-10-CM | POA: Diagnosis not present

## 2021-03-25 DIAGNOSIS — J9601 Acute respiratory failure with hypoxia: Secondary | ICD-10-CM | POA: Diagnosis not present

## 2021-03-25 LAB — MRSA NEXT GEN BY PCR, NASAL: MRSA by PCR Next Gen: NOT DETECTED

## 2021-03-25 LAB — GLUCOSE, CAPILLARY
Glucose-Capillary: 165 mg/dL — ABNORMAL HIGH (ref 70–99)
Glucose-Capillary: 143 mg/dL — ABNORMAL HIGH (ref 70–99)
Glucose-Capillary: 125 mg/dL — ABNORMAL HIGH (ref 70–99)
Glucose-Capillary: 76 mg/dL (ref 70–99)
Glucose-Capillary: 127 mg/dL — ABNORMAL HIGH (ref 70–99)
Glucose-Capillary: 145 mg/dL — ABNORMAL HIGH (ref 70–99)
Glucose-Capillary: 168 mg/dL — ABNORMAL HIGH (ref 70–99)

## 2021-03-25 LAB — POCT I-STAT 7, (LYTES, BLD GAS, ICA,H+H)
Acid-base deficit: 9 mmol/L — ABNORMAL HIGH (ref 0.0–2.0)
Bicarbonate: 19.5 mmol/L — ABNORMAL LOW (ref 20.0–28.0)
Calcium, Ion: 0.9 mmol/L — ABNORMAL LOW (ref 1.15–1.40)
HCT: 38 % — ABNORMAL LOW (ref 39.0–52.0)
Hemoglobin: 12.9 g/dL — ABNORMAL LOW (ref 13.0–17.0)
O2 Saturation: 94 %
Patient temperature: 36.3
Potassium: 4.4 mmol/L (ref 3.5–5.1)
Sodium: 131 mmol/L — ABNORMAL LOW (ref 135–145)
TCO2: 21 mmol/L — ABNORMAL LOW (ref 22–32)
pCO2 arterial: 48.9 mmHg — ABNORMAL HIGH (ref 32.0–48.0)
pH, Arterial: 7.206 — ABNORMAL LOW (ref 7.350–7.450)
pO2, Arterial: 82 mmHg — ABNORMAL LOW (ref 83.0–108.0)

## 2021-03-25 LAB — TRIGLYCERIDES: Triglycerides: 283 mg/dL — ABNORMAL HIGH (ref <150)

## 2021-03-25 LAB — HEPATIC FUNCTION PANEL
ALT: 47 U/L — ABNORMAL HIGH (ref 0–44)
AST: 95 U/L — ABNORMAL HIGH (ref 15–41)
Albumin: 1.8 g/dL — ABNORMAL LOW (ref 3.5–5.0)
Alkaline Phosphatase: 164 U/L — ABNORMAL HIGH (ref 38–126)
Bilirubin, Direct: 3 mg/dL — ABNORMAL HIGH (ref 0.0–0.2)
Indirect Bilirubin: 1.9 mg/dL — ABNORMAL HIGH (ref 0.3–0.9)
Total Bilirubin: 4.9 mg/dL — ABNORMAL HIGH (ref 0.3–1.2)
Total Protein: 5.3 g/dL — ABNORMAL LOW (ref 6.5–8.1)

## 2021-03-25 LAB — ECHOCARDIOGRAM LIMITED
Area-P 1/2: 4.6 cm2
Calc EF: 55.2 %
Height: 69 in
S' Lateral: 3.2 cm
Single Plane A2C EF: 53.2 %
Single Plane A4C EF: 55.8 %
Weight: 3192.26 oz

## 2021-03-25 LAB — BASIC METABOLIC PANEL
Anion gap: 14 (ref 5–15)
BUN: 69 mg/dL — ABNORMAL HIGH (ref 6–20)
CO2: 16 mmol/L — ABNORMAL LOW (ref 22–32)
Calcium: 7.3 mg/dL — ABNORMAL LOW (ref 8.9–10.3)
Chloride: 99 mmol/L (ref 98–111)
Creatinine, Ser: 2.34 mg/dL — ABNORMAL HIGH (ref 0.61–1.24)
GFR, Estimated: 31 mL/min — ABNORMAL LOW (ref >60)
Glucose, Bld: 162 mg/dL — ABNORMAL HIGH (ref 70–99)
Potassium: 4.3 mmol/L (ref 3.5–5.1)
Sodium: 129 mmol/L — ABNORMAL LOW (ref 135–145)

## 2021-03-25 LAB — D-DIMER, QUANTITATIVE: D-Dimer, Quant: 3.76 ug/mL-FEU — ABNORMAL HIGH (ref 0.00–0.50)

## 2021-03-25 LAB — LEGIONELLA PNEUMOPHILA SEROGP 1 UR AG: L. pneumophila Serogp 1 Ur Ag: NEGATIVE

## 2021-03-25 LAB — CBC
HCT: 42.5 % (ref 39.0–52.0)
Hemoglobin: 14.2 g/dL (ref 13.0–17.0)
MCH: 35.9 pg — ABNORMAL HIGH (ref 26.0–34.0)
MCHC: 33.4 g/dL (ref 30.0–36.0)
MCV: 107.6 fL — ABNORMAL HIGH (ref 80.0–100.0)
Platelets: 106 10*3/uL — ABNORMAL LOW (ref 150–400)
RBC: 3.95 MIL/uL — ABNORMAL LOW (ref 4.22–5.81)
RDW: 15.8 % — ABNORMAL HIGH (ref 11.5–15.5)
WBC: 6.2 10*3/uL (ref 4.0–10.5)
nRBC: 2.4 % — ABNORMAL HIGH (ref 0.0–0.2)

## 2021-03-25 LAB — FERRITIN: Ferritin: 1099 ng/mL — ABNORMAL HIGH (ref 24–336)

## 2021-03-25 LAB — C-REACTIVE PROTEIN: CRP: 29.1 mg/dL — ABNORMAL HIGH (ref <1.0)

## 2021-03-25 LAB — MAGNESIUM
Magnesium: 2.3 mg/dL (ref 1.7–2.4)
Magnesium: 2.3 mg/dL (ref 1.7–2.4)

## 2021-03-25 LAB — LACTIC ACID, PLASMA: Lactic Acid, Venous: 5.2 mmol/L (ref 0.5–1.9)

## 2021-03-25 LAB — PHOSPHORUS
Phosphorus: 9.3 mg/dL — ABNORMAL HIGH (ref 2.5–4.6)
Phosphorus: 8.5 mg/dL — ABNORMAL HIGH (ref 2.5–4.6)

## 2021-03-25 LAB — PROCALCITONIN: Procalcitonin: 88.17 ng/mL

## 2021-03-25 MED ORDER — ALBUMIN HUMAN 25 % IV SOLN
25.0000 g | Freq: Four times a day (QID) | INTRAVENOUS | Status: AC
  Administered 2021-03-25 – 2021-03-26 (×4): 25 g via INTRAVENOUS
  Filled 2021-03-25 (×4): qty 100

## 2021-03-25 MED ORDER — DEXMEDETOMIDINE HCL IN NACL 400 MCG/100ML IV SOLN
0.4000 ug/kg/h | INTRAVENOUS | Status: DC
  Administered 2021-03-25: 1.2 ug/kg/h via INTRAVENOUS
  Administered 2021-03-25: 0.6 ug/kg/h via INTRAVENOUS
  Filled 2021-03-25 (×2): qty 100

## 2021-03-25 MED ORDER — SODIUM BICARBONATE 8.4 % IV SOLN
INTRAVENOUS | Status: AC
  Filled 2021-03-25 (×2): qty 1000

## 2021-03-25 MED ORDER — MIDAZOLAM HCL 2 MG/2ML IJ SOLN: 2.0000 mg | INTRAMUSCULAR | Status: DC | PRN

## 2021-03-25 MED ORDER — BARICITINIB 1 MG PO TABS: 2.0000 mg | ORAL_TABLET | Freq: Every day | ORAL | Status: DC

## 2021-03-25 MED ORDER — ROCURONIUM BROMIDE 10 MG/ML (PF) SYRINGE
PREFILLED_SYRINGE | INTRAVENOUS | Status: AC
  Filled 2021-03-25: qty 10

## 2021-03-25 MED ORDER — SODIUM BICARBONATE 8.4 % IV SOLN
INTRAVENOUS | Status: DC
  Filled 2021-03-25: qty 1000

## 2021-03-25 MED ORDER — SODIUM CHLORIDE 0.9 % IV SOLN
2.0000 g | Freq: Two times a day (BID) | INTRAVENOUS | Status: DC
  Administered 2021-03-25 – 2021-03-26 (×2): 2 g via INTRAVENOUS
  Filled 2021-03-25 (×2): qty 2

## 2021-03-25 MED ORDER — BARICITINIB 1 MG PO TABS
1.0000 mg | ORAL_TABLET | Freq: Every day | ORAL | Status: DC
  Administered 2021-03-25: 1 mg
  Filled 2021-03-25: qty 1

## 2021-03-25 MED ORDER — POLYETHYLENE GLYCOL 3350 17 G PO PACK
17.0000 g | PACK | Freq: Every day | ORAL | Status: DC
  Administered 2021-03-26 – 2021-04-01 (×7): 17 g
  Filled 2021-03-25 (×7): qty 1

## 2021-03-25 MED ORDER — VITAL 1.5 CAL PO LIQD
1000.0000 mL | ORAL | Status: DC
  Administered 2021-03-25: 1000 mL

## 2021-03-25 MED ORDER — MIDAZOLAM BOLUS VIA INFUSION
0.0000 mg | INTRAVENOUS | Status: DC | PRN
  Filled 2021-03-25: qty 5

## 2021-03-25 MED ORDER — MIDAZOLAM-SODIUM CHLORIDE 100-0.9 MG/100ML-% IV SOLN
0.0000 mg/h | INTRAVENOUS | Status: DC
  Administered 2021-03-25 – 2021-03-31 (×7): 4 mg/h via INTRAVENOUS
  Filled 2021-03-25 (×8): qty 100

## 2021-03-25 NOTE — Significant Event (Signed)
Called to bedside to assess patient right hand. Aline in place, fingers are cold and dusky, a.line removed. Fingers now have good cap refill, have advised nurse to continue assessments on hand and if continues to have poor circulation will consult vascular.

## 2021-03-25 NOTE — Progress Notes (Signed)
Pharmacy Antibiotic Note  Edwin West is a 60 y.o. male admitted on 03/19/2021 with septic shock secondary to  COVID 19 pneumonia, Pseudomonas pneumonia, and Streptococcus pneumoniae bacteremia . The patient is currently requiring escalating doses of norepinephrine in addition to vasopressin. Pharmacy has been consulted for Cefepime dosing in the setting of reduced renal function. The patient's current creatinine clearance is 38.4 mL/min with low urine output. Cefepime will be used to cover both species of bacteria and adjusted based on renal function.  Plan: Start Cefepime 2 grams every 12 hours. Will follow susceptibilities and escalate if necessary. Follow renal function  Height: 5\' 9"  (175.3 cm) Weight: 90.5 kg (199 lb 8.3 oz) IBW/kg (Calculated) : 70.7  Temp (24hrs), Avg:97.1 F (36.2 C), Min:95.7 F (35.4 C), Max:98.1 F (36.7 C)  Recent Labs  Lab 03/17/2021 0757 04/07/2021 0944 03/28/2021 1236 03/25/21 0229 03/25/21 1031  WBC 0.9*  --  1.1* 6.2  --   CREATININE 2.41*  --  1.79* 2.34*  --   LATICACIDVEN 6.5* 5.9*  --   --  5.2*    Estimated Creatinine Clearance: 38.3 mL/min (A) (by C-G formula based on SCr of 2.34 mg/dL (H)).    No Known Allergies  Antimicrobials this admission: Vancomycin 8/16 >> 8/17 Meropenem 8/16 >> 8/16 Azithromycin x 1 on 8/16 Ceftriaxone 8/16 >>8/17   Microbiology results: 8/16 BCx: S. pneumoniae 8/16 Sputum: S. pneumoniae, P. aeruginosa   8/17 MRSA PCR: negative  Thank you for allowing pharmacy to participate in this patient's care.  9/17, PharmD PGY1 Pharmacy Resident 03/25/2021 3:26 PM Check [email or web address] for unit specific pharmacy number

## 2021-03-25 NOTE — Progress Notes (Signed)
Prone ABG results given to Dr. Katrinka Blazing at this time. No new orders received. RT will continue to monitor and be available as needed.

## 2021-03-25 NOTE — Progress Notes (Signed)
Pt head turned to the right at this time 

## 2021-03-25 NOTE — Progress Notes (Signed)
eLink Physician-Brief Progress Note Patient Name: Edwin West DOB: May 25, 1961 MRN: [id number]   Date of Service  03/25/2021  HPI/Events of Note  Triglyceride level 283.  eICU Interventions  Propofol discontinued and Precedex substituted.        Edwin West [name] 03/25/2021, 6:06 AM

## 2021-03-25 NOTE — Progress Notes (Signed)
Pt.'s head turned to the right with 2 RNs and 1 RT. Lines and tubes assessed and noted to be in correct position and patent. Pt.'s vitals unchanged.

## 2021-03-25 NOTE — Progress Notes (Signed)
Pt head turned to the left at this time. Will continue to monitor throughout night.

## 2021-03-25 NOTE — Progress Notes (Addendum)
NAME:  Masahiro Iglesia, MRN:  [id number], DOB:  September 22, 1960, LOS: 1 ADMISSION DATE:  03/23/2021, CONSULTATION DATE:  03/28/2021 REFERRING MD:  Regenia Skeeter, CHIEF COMPLAINT:  Respiratory Failure/ Shock 2/2 Covid 28   Brief Hx  60 year old male who presented 8/16 with SOB.  He tested positive for COVID at home.  On presentation was profoundly hypoxemic with saturations in the 80's, hypotensive, & in [facility].  He is unvaccinated. CXR revealed extensive confluent opacity throughout the right mid lung and patchy opacities on left.  Labs notable for AKI, lactic acidosis, neutropenia and thrombocytopenia.  Further work up notable for positive urine strep antigen.   Pertinent  Medical History  Cirrhosis - no recent drinking   Significant Hospital Events: Including procedures, antibiotic start and stop dates in addition to other pertinent events   8/16 Admit with R PNA, COVID +. Urine strep + Pf 87 8/17 Peak 26/ Pplat 21, propofol stopped with elevated triglycerides. BC+ for strep pneumoniae  Interim History / Subjective:  Propofol stopped overnight with elevated triglycerides  U. Strep + blood cultures positive for strep pneumoniae  Vent - PEEP 10 / FiO2 100%, peak 26, Pplat 21, DP 11, Pf 87 Bicarbonate gtt started overnight  Glucose range 143-264 Afebrile / WBC 6.2  I/O 600 ml UOP, +6.1L in last 24 hours  On levophed + vasopressin infusions   Objective   Blood pressure 101/66, pulse 88, temperature 98.1 F (36.7 C), temperature source Axillary, resp. rate (!) 31, height '5\' 9"'  (1.753 m), weight 90.5 kg, SpO2 96 %. CVP:  [5 mmHg-12 mmHg] 10 mmHg  Vent Mode: PRVC FiO2 (%):  [100 %] 100 % Set Rate:  [28 bmp-34 bmp] 34 bmp Vt Set:  [520 mL-560 mL] 520 mL PEEP:  [10 LSL37-34 cmH20] 10 cmH20 Plateau Pressure:  [20 cmH20-22 cmH20] 20 cmH20   Intake/Output Summary (Last 24 hours) at 03/25/2021 2876 Last data filed at 03/25/2021 0600 Gross per 24 hour  Intake 7246.57 ml  Output 1100 ml  Net  6146.57 ml   Filed Weights   03/31/2021 0755 03/25/21 0047 03/25/21 8115  Weight: 89.4 kg 90.5 kg 90.5 kg    Examination: General: critically ill appearing adult male lying in bed on vent in NAD HEENT: MM pink/moist, ETT Neuro: sedate  CV: s1s2 RRR, no m/r/g PULM: non-labored on vent, diminished breath sounds bilaterally  GI: soft, protuberant, bsx4 active  Extremities: warm/dry, no edema, telangiectasias noted on chest  Skin: no rashes or lesions  Resolved Hospital Problem list   Hypothermia   Assessment & Plan:   ARDS secondary to Streptococcal PNA + COVID  Strep PNA on R with associated bacteremia likely cause of initial decline, additional COVID positive.  Progressive hypoxia. Ventilator pressures more consistent with bacterial PNA. Pf 87.  -low Vt ventilation 4-8cc/kg -goal plateau pressure <30, driving pressure <72 cm H2O -target PaO2 55-65, titrate PEEP/FiO2 per ARDS protocol  -P/F ratio <150, prone therapy for 16 hours per day -repeat ABG -goal CVP <4, diuresis as necessary -VAP prevention measures  -follow intermittent CXR > consider advancement of ETT pending am CXR review -continue decadron  -baricitinib, remdesivir per pharmacy   Sedation Needs for Mechanical Ventilation  Failed propofol 8/17 with elevated triglycerides  -continue precedex, fentanyl infusions with PRN versed   -PRN paralytics for ventilator synchrony  -RASS Goal -2 to -3 if no paralytics, -4 to -5 with PRN use   Septic Shock secondary to Streptococcal PNA with Bacteremia  Neutropenia  Lactic Acidosis  -  stop LR, sodium bicarbonate at 42m/hr -albumin 100g in 24 hours  -wean vasopressors for MAP >65 -follow up lactic acid  -follow up ECHO  -hold home lasix, aldactone   AKI  Renal [place] normal limits -Trend BMP / urinary output -Replace electrolytes as indicated -Avoid nephrotoxic agents, ensure adequate renal perfusion  Thrombocytopenia  In setting of sepsis + known prior ETOH use  / cirrhosis  -trend CBC  -continue heparin for DVT prophylaxis for now   Atrial Fibrillation  Resolved, NSR 8/17.  Likely in the setting of sepsis  -tele monitoring   Possible Ileus -repeat abd film with pelvis to assess for ileus  -if negative, start TF  Cirrhosis Elevated Alk Phos -follow LFT's, alk phos  Best Practice (right click and "Reselect all SmartList Selections" daily)  Diet/type: NPO DVT prophylaxis: prophylactic heparin  GI prophylaxis: PPI Lines: Central line + aline Foley:  yes, still needed  Code Status:  full code Last date of multidisciplinary goals of care discussion:  Daughter called 8/17 for update, message left for return call.   Critical care time: 319minutes    BNoe Gens MSN, APRN, NP-C, AGACNP-BC [facility] 03/25/2021, 8:23 AM   Please see [email or web address] for pager details.   From 7A-7P if no response, please call [phone] After hours, please call ELink [phone]

## 2021-03-25 NOTE — Progress Notes (Signed)
Pts ETT hollister removed and ETT resecured with cloth tape at 24cm at the lip. Protective barriers placed on pts cheeks and upper lip. No breakdown to the pts face noted. Pt then proned. RT x2, RN x3 and NT x1 at bedside. Pt tolerated well. Suction catheter able to be passed. Pts arm placed in to swimming position and a-line re-zeroed. RT will continue to monitor and be available as needed.

## 2021-03-25 NOTE — Progress Notes (Signed)
Pts head turn to the right and arms repositioned. Pt remains prone. RT will continue to monitor.

## 2021-03-25 NOTE — Progress Notes (Addendum)
eLink Physician-Brief Progress Note Patient Name: Edwin West DOB: February 18, 1961 MRN: [id number]   Date of Service  03/25/2021  HPI/Events of Note  Concern for extremity circulation compromise, patient had an arterial line in the affected wrist which was recently discontinued.  eICU Interventions  Bedside RN will doppler the radial and ulnar arteries for pulses, followed by direct visual assessment by me.   ADDENDUM Will ask the PCCM Ground Crew to stop by and assess the extremity directly.        Thomasene Lot Kathreen Dileo 03/25/2021, 10:01 PM

## 2021-03-25 NOTE — Progress Notes (Signed)
Patient's cell phone, wallet and clothes sent home with daughter [name].   Lin Landsman RN

## 2021-03-25 NOTE — Progress Notes (Addendum)
Pt successfully proned with 3 RNs, 2 RTs, & 1 NT. Lines and tubes assessed and pt stable with head turned to the left. All pressure points covered with prophylactic foam dressings.

## 2021-03-25 NOTE — Progress Notes (Signed)
Brief critical care event note.  Called to bedside to evaluate the patient's right hand.  Patient had a radial arterial line in place earlier this evening and it was noted that the hand was becoming dusky therefore the arterial line was removed.  Nursing staff was concerned that the hand continued to look dusky after the A-line removal.  Exam: Right axilla bounding pulse Right brachial bounding pulse Right ulnar pulse intact Right radial pulse not detectable with Doppler  Right hand from the base of the thenar eminence, distally, and cyanotic.  The pallor is the same on the hand as it is on the upper part of the arm.  Capillary refill is present but slightly delayed when compared to the other hand.  Assessment: Arterial insufficiency of the radial artery. COVID pneumonia leading to septic shock Cirrhosis of the liver  Plan: Patient is critically ill and in the prone position with 100% FiO2 and on multiple vasoactive support drugs.  At this time patient is not able to be placed in a supine position without significant detriment to the respiratory status. A phone call out to vascular surgery for any suggestions for noninvasive therapies. Place heating packs on the hand now. Continue neurovascular checks of the right hand

## 2021-03-25 NOTE — Progress Notes (Addendum)
eLink Physician-Brief Progress Note Patient Name: Charles Andringa DOB: 12/09/1960 MRN: [id number]   Date of Service  03/25/2021  HPI/Events of Note  Patient with respiratory rate of 34 and Vt at 7 ml / kg with peak airway pressure of 30, PCO2 is 52 and PH is 7.1, PEEP is 10 but he is on 100 % FIO2 with a PO2 of 87 (he does need the FIO2 weaned if at all possible), no good options for modulating respiratory acid base disturbance by improving minute ventilation or decreasing PEEP.  eICU Interventions  Will start a sodium bicarbonate gtt at 75 ml / hour.        Korrine Sicard U Tarae Wooden 03/25/2021, 12:01 AM

## 2021-03-25 NOTE — Procedures (Signed)
Cortrak ? ?Person Inserting Tube:  Demira Gwynne, RD ?Tube Type:  Cortrak - 43 inches ?Tube Size:  10 ?Tube Location:  Left nare ?Initial Placement:  Postpyloric ?Secured by: Bridle ?Technique Used to Measure Tube Placement:  Marking at nare/corner of mouth ?Cortrak Secured At:  100 cm ?Procedure Comments:  Cortrak Tube Team Note: ? ?Consult received to place a Cortrak feeding tube.  ? ?X-ray is required, abdominal x-ray has been ordered by the Cortrak team. Please confirm tube placement before using the Cortrak tube.  ? ?If the tube becomes dislodged please keep the tube and contact the Cortrak team at [email or web address] (password [email or web address]) for replacement.  ?If after hours and replacement cannot be delayed, place a NG tube and confirm placement with an abdominal x-ray.  ? ? ? Dorethia Jeanmarie MS, RDN, LDN, CNSC ?Registered Dietitian III ?Clinical Nutrition ?RD Pager and On-Call Pager Number Located in Amion  ?  ? ? ?

## 2021-03-25 NOTE — Progress Notes (Signed)
  Echocardiogram 2D Echocardiogram has been performed.  Edwin West 03/25/2021, 11:03 AM

## 2021-03-25 NOTE — Progress Notes (Signed)
Initial Nutrition Assessment  DOCUMENTATION CODES:   Not applicable  INTERVENTION:   Initiate trickle tube feeds via post-pyloric Cortrak tube: - Vital 1.5 @ 20 ml/hr (480 ml/day)  Trickle tube feeding regimen provides 720 kcal, 32 grams of protein, and 367 ml of H2O.    RD will monitor for ability to advance tube feeds to goal: - Vital 1.5 @ 45 ml/hr (1080 ml/day) - ProSource TF 90 ml TID - Free water flushes of 150 ml q 4 hours  Tube feeding regimen at recommended goal rate would provide 1860 kcal, 139 grams of protein, and 825 ml of H2O.  Total free water with recommended flushes: 1725 ml  NUTRITION DIAGNOSIS:   Increased nutrient needs related to acute illness (COVID-19 ARDS) as evidenced by estimated needs.  GOAL:   Patient will meet greater than or equal to 90% of their needs  MONITOR:   Vent status, Labs, Weight trends, TF tolerance, I & O's  REASON FOR ASSESSMENT:   Ventilator, Consult Enteral/tube feeding initiation and management  ASSESSMENT:   60 year old male who presented to the ED on 8/16 with SOB. Pt tested positive for COVID-19. PMH of cirrhosis. Pt admitted with COVID ARDS and required intubation. Pt also with decompensated alcoholic cirrhosis and AKI.  8/16 - intubated 8/17 - Cortrak placed (tip post-pyloric)  Discussed pt with RN and during ICU rounds. Pt proned today.  Consult received for tube feeding initiation and management. Pt with OG tube in stomach with side port below GE junction, currently to low intermittent suction with brown output. Post-pyloric Cortrak tube placed today. Ileus appears to be resolving per abdominal x-ray. PCCM okay with starting trickle tube feeds via Cortrak.  Unable to obtain diet and weight history at this time. Reviewed weight history in chart. Pt with a weight loss of 6.8 kg since 11/11/20. This is a 7% weight loss in 4 months which is not significant for timeframe.  Admit weight: 89.4 kg Current weight: 90.5  kg  Pt with +2 pitting generalized edema.  Patient is currently intubated on ventilator support MV: 16 L/min Temp (24hrs), Avg:97.2 F (36.2 C), Min:95.7 F (35.4 C), Max:98.1 F (36.7 C) BP (a-line): 109/51 MAP (a-line): 69  Drips: Fentanyl Versed LR: 100 ml/hr Sodium bicarb: 75 ml/hr NS: 10 ml/hr Levophed Vasopressin  Medications reviewed and include: IV decadron, colace, IV protonix, IV abx, remdesivir, IV albumin  Labs reviewed: sodium 129, BUN 69, creatinine 2.34, ionized calcium 1.01, phosphorus 9.3, elevated LFTs, TG 283, lactic acid 5.2 (trending down) CBG's: 35-264 x 24 hours  UOP: 600 ml x 24 hours OGT: 450 ml x 12 hours I/O's: +7.3 L since admit  NUTRITION - FOCUSED PHYSICAL EXAM:  Flowsheet Row Most Recent Value  Orbital Region Moderate depletion  Upper Arm Region Mild depletion  Thoracic and Lumbar Region Unable to assess  Buccal Region Unable to assess  Temple Region Moderate depletion  Clavicle Bone Region Mild depletion  Clavicle and Acromion Bone Region Mild depletion  Scapular Bone Region Mild depletion  Dorsal Hand Unable to assess  Patellar Region No depletion  Anterior Thigh Region No depletion  Posterior Calf Region No depletion  Edema (RD Assessment) Moderate       Diet Order:   Diet Order             Diet NPO time specified  Diet effective now                   EDUCATION NEEDS:   No  education needs have been identified at this time  Skin:  Skin Assessment: Reviewed RN Assessment  Last BM:  04/02/2021 small type 7  Height:   Ht Readings from Last 1 Encounters:  03/18/2021 5\' 9"  (1.753 m)    Weight:   Wt Readings from Last 1 Encounters:  03/25/21 90.5 kg    BMI:  Body mass index is 29.46 kg/m.  Estimated Nutritional Needs:   Kcal:  1700-1900  Protein:  125-145 grams  Fluid:  >/= 2.0 L    03/27/21, MS, RD, LDN Inpatient Clinical Dietitian Please see AMiON for contact information.

## 2021-03-26 ENCOUNTER — Inpatient Hospital Stay (HOSPITAL_COMMUNITY): Payer: Commercial Managed Care - PPO | Admitting: Anesthesiology

## 2021-03-26 ENCOUNTER — Inpatient Hospital Stay (HOSPITAL_COMMUNITY): Payer: Commercial Managed Care - PPO

## 2021-03-26 ENCOUNTER — Encounter (HOSPITAL_COMMUNITY): Payer: Self-pay | Admitting: Internal Medicine

## 2021-03-26 ENCOUNTER — Encounter (HOSPITAL_COMMUNITY): Admission: EM | Disposition: E | Payer: Self-pay | Source: Home / Self Care | Attending: Pulmonary Disease

## 2021-03-26 DIAGNOSIS — I742 Embolism and thrombosis of arteries of the upper extremities: Secondary | ICD-10-CM

## 2021-03-26 DIAGNOSIS — M62241 Nontraumatic ischemic infarction of muscle, right hand: Secondary | ICD-10-CM | POA: Diagnosis not present

## 2021-03-26 DIAGNOSIS — J9601 Acute respiratory failure with hypoxia: Secondary | ICD-10-CM | POA: Diagnosis not present

## 2021-03-26 HISTORY — PX: THROMBECTOMY BRACHIAL ARTERY: SHX6649

## 2021-03-26 LAB — CBC
HCT: 32.1 % — ABNORMAL LOW (ref 39.0–52.0)
Hemoglobin: 11.2 g/dL — ABNORMAL LOW (ref 13.0–17.0)
MCH: 36.8 pg — ABNORMAL HIGH (ref 26.0–34.0)
MCHC: 34.9 g/dL (ref 30.0–36.0)
MCV: 105.6 fL — ABNORMAL HIGH (ref 80.0–100.0)
Platelets: 55 10*3/uL — ABNORMAL LOW (ref 150–400)
RBC: 3.04 MIL/uL — ABNORMAL LOW (ref 4.22–5.81)
RDW: 15.7 % — ABNORMAL HIGH (ref 11.5–15.5)
WBC: 17.8 10*3/uL — ABNORMAL HIGH (ref 4.0–10.5)
nRBC: 1.4 % — ABNORMAL HIGH (ref 0.0–0.2)

## 2021-03-26 LAB — POCT I-STAT 7, (LYTES, BLD GAS, ICA,H+H)
Acid-base deficit: 4 mmol/L — ABNORMAL HIGH (ref 0.0–2.0)
Bicarbonate: 23.1 mmol/L (ref 20.0–28.0)
Calcium, Ion: 0.89 mmol/L — CL (ref 1.15–1.40)
HCT: 32 % — ABNORMAL LOW (ref 39.0–52.0)
Hemoglobin: 10.9 g/dL — ABNORMAL LOW (ref 13.0–17.0)
O2 Saturation: 91 %
Patient temperature: 37
Potassium: 4.4 mmol/L (ref 3.5–5.1)
Sodium: 131 mmol/L — ABNORMAL LOW (ref 135–145)
TCO2: 25 mmol/L (ref 22–32)
pCO2 arterial: 53 mmHg — ABNORMAL HIGH (ref 32.0–48.0)
pH, Arterial: 7.248 — ABNORMAL LOW (ref 7.350–7.450)
pO2, Arterial: 73 mmHg — ABNORMAL LOW (ref 83.0–108.0)

## 2021-03-26 LAB — COMPREHENSIVE METABOLIC PANEL WITH GFR
ALT: 36 U/L (ref 0–44)
AST: 94 U/L — ABNORMAL HIGH (ref 15–41)
Albumin: 2.4 g/dL — ABNORMAL LOW (ref 3.5–5.0)
Alkaline Phosphatase: 106 U/L (ref 38–126)
Anion gap: 14 (ref 5–15)
BUN: 83 mg/dL — ABNORMAL HIGH (ref 6–20)
CO2: 21 mmol/L — ABNORMAL LOW (ref 22–32)
Calcium: 6.7 mg/dL — ABNORMAL LOW (ref 8.9–10.3)
Chloride: 95 mmol/L — ABNORMAL LOW (ref 98–111)
Creatinine, Ser: 2.91 mg/dL — ABNORMAL HIGH (ref 0.61–1.24)
GFR, Estimated: 24 mL/min — ABNORMAL LOW
Glucose, Bld: 215 mg/dL — ABNORMAL HIGH (ref 70–99)
Potassium: 4.3 mmol/L (ref 3.5–5.1)
Sodium: 130 mmol/L — ABNORMAL LOW (ref 135–145)
Total Bilirubin: 5.6 mg/dL — ABNORMAL HIGH (ref 0.3–1.2)
Total Protein: 4.8 g/dL — ABNORMAL LOW (ref 6.5–8.1)

## 2021-03-26 LAB — CULTURE, BLOOD (ROUTINE X 2)

## 2021-03-26 LAB — GLOBAL TEG PANEL
CFF Max Amplitude: 23.3 mm (ref 15–32)
CK with Heparinase (R): 8.7 min — ABNORMAL HIGH (ref 4.3–8.3)
Citrated Functional Fibrinogen: 425.2 mg/dL (ref 278–581)
Citrated Kaolin (K): 1.3 min (ref 0.8–2.1)
Citrated Kaolin (MA): 56.9 mm (ref 52–69)
Citrated Kaolin (R): 8.4 min (ref 4.6–9.1)
Citrated Kaolin Angle: 73.8 deg (ref 63–78)
Citrated Rapid TEG (MA): 56.8 mm (ref 52–70)

## 2021-03-26 LAB — DIC (DISSEMINATED INTRAVASCULAR COAGULATION)PANEL
D-Dimer, Quant: 3.2 ug/mL-FEU — ABNORMAL HIGH (ref 0.00–0.50)
Fibrinogen: 312 mg/dL (ref 210–475)
INR: 1.6 — ABNORMAL HIGH (ref 0.8–1.2)
Platelets: UNDETERMINED 10*3/uL (ref 150–400)
Prothrombin Time: 19.2 seconds — ABNORMAL HIGH (ref 11.4–15.2)
Smear Review: NONE SEEN
aPTT: 38 seconds — ABNORMAL HIGH (ref 24–36)

## 2021-03-26 LAB — CALCIUM, IONIZED: Calcium, Ionized, Serum: 3.7 mg/dL — ABNORMAL LOW (ref 4.5–5.6)

## 2021-03-26 LAB — GLUCOSE, CAPILLARY
Glucose-Capillary: 166 mg/dL — ABNORMAL HIGH (ref 70–99)
Glucose-Capillary: 240 mg/dL — ABNORMAL HIGH (ref 70–99)
Glucose-Capillary: 250 mg/dL — ABNORMAL HIGH (ref 70–99)
Glucose-Capillary: 209 mg/dL — ABNORMAL HIGH (ref 70–99)
Glucose-Capillary: 248 mg/dL — ABNORMAL HIGH (ref 70–99)

## 2021-03-26 LAB — HEMOGLOBIN A1C
Hgb A1c MFr Bld: 5.3 % (ref 4.8–5.6)
Mean Plasma Glucose: 105.41 mg/dL

## 2021-03-26 LAB — HEPARIN LEVEL (UNFRACTIONATED): Heparin Unfractionated: 0.22 IU/mL — ABNORMAL LOW (ref 0.30–0.70)

## 2021-03-26 LAB — CULTURE, RESPIRATORY W GRAM STAIN

## 2021-03-26 LAB — VANCOMYCIN, RANDOM
Vancomycin Rm: 9
Vancomycin Rm: 9

## 2021-03-26 LAB — MAGNESIUM: Magnesium: 2.4 mg/dL (ref 1.7–2.4)

## 2021-03-26 LAB — LACTIC ACID, PLASMA: Lactic Acid, Venous: 5.5 mmol/L (ref 0.5–1.9)

## 2021-03-26 LAB — PHOSPHORUS: Phosphorus: 8.1 mg/dL — ABNORMAL HIGH (ref 2.5–4.6)

## 2021-03-26 LAB — PROCALCITONIN: Procalcitonin: 79.12 ng/mL

## 2021-03-26 SURGERY — THROMBECTOMY, ARTERY, BRACHIAL
Anesthesia: General | Laterality: Right

## 2021-03-26 MED ORDER — ALBUMIN HUMAN 25 % IV SOLN
25.0000 g | Freq: Four times a day (QID) | INTRAVENOUS | Status: AC
Start: 2021-03-26 — End: 2021-03-27
  Administered 2021-03-26 – 2021-03-27 (×3): 25 g via INTRAVENOUS
  Filled 2021-03-26 (×3): qty 100

## 2021-03-26 MED ORDER — LACTULOSE 10 GM/15ML PO SOLN
20.0000 g | Freq: Two times a day (BID) | ORAL | Status: DC
  Administered 2021-03-26 – 2021-03-27 (×3): 20 g
  Filled 2021-03-26 (×3): qty 30

## 2021-03-26 MED ORDER — HEMOSTATIC AGENTS (NO CHARGE) OPTIME
TOPICAL | Status: DC | PRN
  Administered 2021-03-26: 1 via TOPICAL

## 2021-03-26 MED ORDER — SODIUM BICARBONATE 8.4 % IV SOLN
INTRAVENOUS | Status: AC
  Filled 2021-03-26: qty 1000

## 2021-03-26 MED ORDER — PROSOURCE TF PO LIQD
90.0000 mL | Freq: Three times a day (TID) | ORAL | Status: DC
  Administered 2021-03-26 – 2021-04-01 (×18): 90 mL
  Filled 2021-03-26 (×18): qty 90

## 2021-03-26 MED ORDER — HEPARIN SODIUM (PORCINE) 1000 UNIT/ML IJ SOLN
INTRAMUSCULAR | Status: DC | PRN
  Administered 2021-03-26: 5000 [IU] via INTRAVENOUS

## 2021-03-26 MED ORDER — PROPOFOL 10 MG/ML IV BOLUS
INTRAVENOUS | Status: AC
  Filled 2021-03-26: qty 20

## 2021-03-26 MED ORDER — VITAL 1.5 CAL PO LIQD
1000.0000 mL | ORAL | Status: DC
  Administered 2021-03-31: 1000 mL
  Filled 2021-03-26 (×4): qty 1000

## 2021-03-26 MED ORDER — INSULIN ASPART 100 UNIT/ML IJ SOLN
0.0000 [IU] | INTRAMUSCULAR | Status: DC
  Administered 2021-03-26 (×2): 5 [IU] via SUBCUTANEOUS
  Administered 2021-03-26: 8 [IU] via SUBCUTANEOUS
  Administered 2021-03-26: 5 [IU] via SUBCUTANEOUS
  Administered 2021-03-27 (×2): 3 [IU] via SUBCUTANEOUS
  Administered 2021-03-27: 8 [IU] via SUBCUTANEOUS
  Administered 2021-03-27: 2 [IU] via SUBCUTANEOUS
  Administered 2021-03-27 (×2): 8 [IU] via SUBCUTANEOUS
  Administered 2021-03-28: 3 [IU] via SUBCUTANEOUS
  Administered 2021-03-28: 5 [IU] via SUBCUTANEOUS
  Administered 2021-03-28: 3 [IU] via SUBCUTANEOUS
  Administered 2021-03-28 – 2021-03-29 (×4): 5 [IU] via SUBCUTANEOUS
  Administered 2021-03-29: 3 [IU] via SUBCUTANEOUS
  Administered 2021-03-29 (×2): 5 [IU] via SUBCUTANEOUS
  Administered 2021-03-29: 3 [IU] via SUBCUTANEOUS
  Administered 2021-03-29: 5 [IU] via SUBCUTANEOUS
  Administered 2021-03-30: 8 [IU] via SUBCUTANEOUS
  Administered 2021-03-30: 3 [IU] via SUBCUTANEOUS
  Administered 2021-03-30 (×2): 8 [IU] via SUBCUTANEOUS
  Administered 2021-03-30 – 2021-03-31 (×3): 5 [IU] via SUBCUTANEOUS
  Administered 2021-03-31: 3 [IU] via SUBCUTANEOUS
  Administered 2021-03-31 (×2): 5 [IU] via SUBCUTANEOUS
  Administered 2021-03-31 (×2): 8 [IU] via SUBCUTANEOUS
  Administered 2021-04-01 (×2): 5 [IU] via SUBCUTANEOUS

## 2021-03-26 MED ORDER — HEPARIN (PORCINE) 25000 UT/250ML-% IV SOLN
1400.0000 [IU]/h | INTRAVENOUS | Status: DC
  Administered 2021-03-26: 1500 [IU]/h via INTRAVENOUS
  Administered 2021-03-29: 1400 [IU]/h via INTRAVENOUS
  Filled 2021-03-26 (×5): qty 250

## 2021-03-26 MED ORDER — FENTANYL CITRATE (PF) 250 MCG/5ML IJ SOLN
INTRAMUSCULAR | Status: AC
  Filled 2021-03-26: qty 5

## 2021-03-26 MED ORDER — NOREPINEPHRINE 16 MG/250ML-% IV SOLN
0.0000 ug/min | INTRAVENOUS | Status: DC
  Administered 2021-03-26: 23 ug/min via INTRAVENOUS
  Filled 2021-03-26 (×2): qty 250

## 2021-03-26 MED ORDER — SODIUM CHLORIDE 0.9 % IV SOLN
1.0000 g | Freq: Two times a day (BID) | INTRAVENOUS | Status: DC
  Administered 2021-03-26 – 2021-03-28 (×6): 1 g via INTRAVENOUS
  Filled 2021-03-26 (×7): qty 1

## 2021-03-26 MED ORDER — MIDAZOLAM HCL 2 MG/2ML IJ SOLN
INTRAMUSCULAR | Status: AC
  Filled 2021-03-26: qty 2

## 2021-03-26 MED ORDER — LACTATED RINGERS IV SOLN: INTRAVENOUS | Status: DC | PRN

## 2021-03-26 MED ORDER — ROCURONIUM BROMIDE 10 MG/ML (PF) SYRINGE
PREFILLED_SYRINGE | INTRAVENOUS | Status: DC | PRN
  Administered 2021-03-26: 70 mg via INTRAVENOUS

## 2021-03-26 MED ORDER — HEPARIN 6000 UNIT IRRIGATION SOLUTION
Status: DC | PRN
  Administered 2021-03-26: 1

## 2021-03-26 MED ORDER — VANCOMYCIN VARIABLE DOSE PER UNSTABLE RENAL FUNCTION (PHARMACIST DOSING)
Status: DC
  Filled 2021-03-26: qty 1

## 2021-03-26 MED ORDER — VANCOMYCIN VARIABLE DOSE PER UNSTABLE RENAL FUNCTION (PHARMACIST DOSING): Status: DC

## 2021-03-26 MED ORDER — HEPARIN 6000 UNIT IRRIGATION SOLUTION
Status: AC
  Filled 2021-03-26: qty 500

## 2021-03-26 MED ORDER — CALCIUM GLUCONATE-NACL 2-0.675 GM/100ML-% IV SOLN
2.0000 g | Freq: Once | INTRAVENOUS | Status: AC
  Administered 2021-03-26: 2000 mg via INTRAVENOUS
  Filled 2021-03-26: qty 100

## 2021-03-26 MED ORDER — VANCOMYCIN HCL 1500 MG/300ML IV SOLN
1500.0000 mg | Freq: Once | INTRAVENOUS | Status: AC
  Administered 2021-03-26: 1500 mg via INTRAVENOUS
  Filled 2021-03-26: qty 300

## 2021-03-26 MED ORDER — OCTREOTIDE ACETATE 100 MCG/ML IJ SOLN
100.0000 ug | Freq: Three times a day (TID) | INTRAMUSCULAR | Status: DC
  Administered 2021-03-26 – 2021-03-28 (×6): 100 ug via SUBCUTANEOUS
  Filled 2021-03-26 (×8): qty 1

## 2021-03-26 MED ORDER — 0.9 % SODIUM CHLORIDE (POUR BTL) OPTIME
TOPICAL | Status: DC | PRN
  Administered 2021-03-26: 1000 mL

## 2021-03-26 MED ORDER — MIDODRINE HCL 5 MG PO TABS
10.0000 mg | ORAL_TABLET | Freq: Three times a day (TID) | ORAL | Status: DC
  Administered 2021-03-26 – 2021-03-31 (×16): 10 mg
  Filled 2021-03-26 (×16): qty 2

## 2021-03-26 SURGICAL SUPPLY — 41 items
ADH SKN CLS APL DERMABOND .7 (GAUZE/BANDAGES/DRESSINGS) ×1
ARMBAND PINK RESTRICT EXTREMIT (MISCELLANEOUS) ×2 IMPLANT
BAG COUNTER SPONGE SURGICOUNT (BAG) ×2 IMPLANT
BAG SPNG CNTER NS LX DISP (BAG) ×1
CANISTER SUCT 3000ML PPV (MISCELLANEOUS) ×2 IMPLANT
CATH EMB 3FR 40CM (CATHETERS) ×1 IMPLANT
CATH EMB LATEX FREE 2FRX60CM (CATHETERS) ×2
CATH EMB LF 2FRX60 (CATHETERS) IMPLANT
CLIP LIGATING EXTRA MED SLVR (CLIP) ×2 IMPLANT
CLIP LIGATING EXTRA SM BLUE (MISCELLANEOUS) ×2 IMPLANT
CLIP VESOCCLUDE MED 6/CT (CLIP) ×2 IMPLANT
CLIP VESOCCLUDE SM WIDE 6/CT (CLIP) ×2 IMPLANT
COVER PROBE W GEL 5X96 (DRAPES) ×2 IMPLANT
DERMABOND ADVANCED (GAUZE/BANDAGES/DRESSINGS) ×1
DERMABOND ADVANCED .7 DNX12 (GAUZE/BANDAGES/DRESSINGS) ×1 IMPLANT
DRSG TEGADERM 4X4.75 (GAUZE/BANDAGES/DRESSINGS) ×1 IMPLANT
ELECT REM PT RETURN 9FT ADLT (ELECTROSURGICAL) ×2
ELECTRODE REM PT RTRN 9FT ADLT (ELECTROSURGICAL) ×1 IMPLANT
GAUZE 4X4 16PLY ~~LOC~~+RFID DBL (SPONGE) ×1 IMPLANT
GAUZE SPONGE 4X4 12PLY STRL (GAUZE/BANDAGES/DRESSINGS) ×2 IMPLANT
GLOVE SURG ENC MOIS LTX SZ7.5 (GLOVE) ×2 IMPLANT
GOWN STRL REUS W/ TWL LRG LVL3 (GOWN DISPOSABLE) ×2 IMPLANT
GOWN STRL REUS W/ TWL XL LVL3 (GOWN DISPOSABLE) ×3 IMPLANT
GOWN STRL REUS W/TWL LRG LVL3 (GOWN DISPOSABLE) ×4
GOWN STRL REUS W/TWL XL LVL3 (GOWN DISPOSABLE) ×6
KIT BASIN OR (CUSTOM PROCEDURE TRAY) ×2 IMPLANT
KIT TURNOVER KIT B (KITS) ×2 IMPLANT
NS IRRIG 1000ML POUR BTL (IV SOLUTION) ×2 IMPLANT
PACK CV ACCESS (CUSTOM PROCEDURE TRAY) ×2 IMPLANT
PAD ARMBOARD 7.5X6 YLW CONV (MISCELLANEOUS) ×4 IMPLANT
POWDER SURGICEL 3.0 GRAM (HEMOSTASIS) ×1 IMPLANT
SPONGE T-LAP 18X18 ~~LOC~~+RFID (SPONGE) ×2 IMPLANT
STAPLER VISISTAT 35W (STAPLE) ×1 IMPLANT
SUT MNCRL AB 4-0 PS2 18 (SUTURE) ×2 IMPLANT
SUT PROLENE 6 0 BV (SUTURE) ×2 IMPLANT
SUT PROLENE 7 0 BV 1 (SUTURE) ×1 IMPLANT
SUT VIC AB 3-0 SH 27 (SUTURE) ×2
SUT VIC AB 3-0 SH 27X BRD (SUTURE) ×1 IMPLANT
TOWEL GREEN STERILE (TOWEL DISPOSABLE) ×2 IMPLANT
UNDERPAD 30X36 HEAVY ABSORB (UNDERPADS AND DIAPERS) ×2 IMPLANT
WATER STERILE IRR 1000ML POUR (IV SOLUTION) ×2 IMPLANT

## 2021-03-26 NOTE — Op Note (Signed)
    Patient name: Edwin West MRN: [id number] DOB: 1961/08/05 Sex: male  04-04-21 Pre-operative Diagnosis: acute occlusion of right radial artery Post-operative diagnosis:  Same Surgeon:  Luanna Salk. Randie Heinz, MD Assistant: Clinton Gallant, PA Procedure Performed: Right radial artery embolectomy  Indications: 60 year old male with newly diagnosed COVID ARDS.  He is now intubated and sedated on the ventilator.  An arterial line was placed the night before this and was removed when the patient had dusky discoloration of his right hand.  Unfortunately at that time the patient was prone.  I evaluated the patient earlier today and heparin drip was started with no resolution and duplex demonstrated occlusion of his right radial artery as well as the palmar arch.  He was indicated for right upper extremity embolectomy.  An assistant was necessary to facilitate exposure and expedite the case.  Findings: There is acute thrombus extending several centimeters back the radial artery and into the hand.  I could only pass a 2 and 3 Fogarty up to 10 cm.  I did have a decent backbleeding very strong antegrade bleeding and at completion there was a distal palpable radial artery pulse.   Procedure:  The patient was identified in the holding area and taken to the operating room where she was supine on upper table.  He was previously intubated and sedated General anesthesia was connected.  Antibiotics were up-to-date he is on scheduled antibiotics.  Timeout was called.  Marginal incision was made overlying the previous arterial stick in the right wrist.  We dissected down to protected the nerve identify the radial artery dissected this out for several centimeters.  Patient was already on heparin additional 5000 units of heparin was administered.  Vesseloops placed around the radial artery.  Transverse arteriotomy was performed at the site of the previous cannulation.  We passed a 2 and 3 Fogarty distally we can only  pass up to 10 cm we did get some backbleeding with minimal clot.  We then passed the 3 Fogarty proximally and had significant clot returned with very strong inflow.  We passed 1 further time with no return of clot.  We then clamped the artery proximally distally.  I flushed with heparinized saline distally.  We then repaired the artery with running 7-0 Prolene suture.  Prior completion we allowed usual flushing maneuvers.  There was then a strong distal pulse in the radial artery that could be palpated up to the snuffbox.  We irrigated the wound.  I closed a layer over the artery with running 3-0 Vicryl.  The skin did appear quite macerated from edema and previous cannulation so staples were placed there.  A sterile dressing was applied.  He was then transferred back to his bed having tolerated seizure without any complication.  Plan will be for direct transfer to the ICU.  All counts were correct at completion.   EBL: 50cc  Mariene Dickerman C. Randie Heinz, MD [facility] Office: [phone] Pager: [phone]

## 2021-03-26 NOTE — Progress Notes (Signed)
NAME:  Edwin West, MRN:  [id number], DOB:  03-12-1961, LOS: 2 ADMISSION DATE:  03/20/2021, CONSULTATION DATE:  03/17/2021 REFERRING MD:  Criss Alvine, CHIEF COMPLAINT:  Respiratory Failure/ Shock 2/2 Covid 51   Brief Hx  60 year old male who presented 8/16 with SOB.  He tested positive for COVID at home.  On presentation was profoundly hypoxemic with saturations in the 80's, hypotensive, & in [facility].  He is unvaccinated. CXR revealed extensive confluent opacity throughout the right mid lung and patchy opacities on left.  Labs notable for AKI, lactic acidosis, neutropenia and thrombocytopenia.  Further work up notable for positive urine strep antigen.   Pertinent  Medical History  Cirrhosis - no recent drinking   Significant Hospital Events: Including procedures, antibiotic start and stop dates in addition to other pertinent events   8/16 Admit with R PNA, COVID +. Urine strep + Pf 87 8/17 Peak 26/ Pplat 21, propofol stopped with elevated triglycerides. BC+ for strep pneumoniae 8/18 proned  Interim History / Subjective:  R hand looks ischemic, art line removed, duplex pending. Remains intubated/sedated/on pressors.  Objective   Blood pressure (!) 98/53, pulse 71, temperature 98.8 F (37.1 C), temperature source Bladder, resp. rate (!) 34, height 5\' 9"  (1.753 m), weight 95.5 kg, SpO2 100 %.    Vent Mode: PRVC FiO2 (%):  [100 %] 100 % Set Rate:  [34 bmp] 34 bmp Vt Set:  [520 mL] 520 mL PEEP:  [10 cmH20] 10 cmH20 Plateau Pressure:  [21 cmH20] 21 cmH20   Intake/Output Summary (Last 24 hours) at April 20, 2021 03/28/2021 Last data filed at 04/20/2021 0645 Gross per 24 hour  Intake 3958.21 ml  Output 700 ml  Net 3258.21 ml    Filed Weights   03/25/21 0047 03/25/21 0212 2021-04-20 0453  Weight: 90.5 kg 90.5 kg 95.5 kg    Examination: Jaundiced man on vent Abdomen soft, +BS Diffuse anasarca RASS -5 Lungs with improved rhonci  WBC up Plts down Calcium being repleted BUN/Cr  creeping up Bili creeping up CXR dense RLL PNA  Resolved Hospital Problem list   Hypothermia   Assessment & Plan:   ARDS secondary to Streptococcal/Pseudomonal PNA + COVID R radial a-line associated blood clot Strep PNA on R with associated bacteremia likely cause of initial decline, additional COVID positive.  Progressive hypoxia. Ventilator pressures more consistent with bacterial PNA. Pf 87.  Thrombocytopenia - in setting of sepsis + known prior ETOH use / cirrhosis; low 4T score Decompensated alcoholic cirrhosis Sedation Needs for Mechanical Ventilation - Failed propofol 8/17 with elevated triglycerides  -low Vt ventilation 4-8cc/kg -goal plateau pressure <30, driving pressure 9/17 cm <95 -target PaO2 55-65, titrate PEEP/FiO2 per ARDS protocol  -P/F ratio <150, prone therapy for 16 hours per day: check radial artery duplex and see if we need to consider thrombectomy prior to proning again; with more of a unilateral disease picture may not have the benefit we want -VAP prevention measures  -decadron/remdesivir -continue versed, fentanyl infusions with PRN versed   -PRN paralytics for ventilator synchrony  -RASS Goal -2 to -3 if no paralytics, -4 to -5 with PRN use   Septic Shock secondary to Streptococcal/Pseudomonal PNA with Bacteremia- still on pressors; partially related to sedation; echo okay Neutropenia resolved Lactic Acidosis  AKI - Renal A2Z within normal limits; probably just septic ATN but should treat empirically for HRS - Trend BMP / urinary output, keep foley - Replace electrolytes as indicated - Avoid nephrotoxic agents, ensure adequate renal perfusion - Octreotide/albumin/midodrine -  Abx broadened to vanc/meropenem pending susceptibilities  Atrial Fibrillation  Resolved, NSR 8/17.  Likely in the setting of sepsis  -tele monitoring   Possible Ileus- improved on KUB, advance postpyloric TF to goal, start lactulose  Best Practice (right click and "Reselect all  SmartList Selections" daily)  Diet/type: TF DVT prophylaxis: start heparin drip GI prophylaxis: PPI Lines: Central line + aline Foley:  yes, still needed  Code Status:  full code Last date of multidisciplinary goals of care discussion:  Daughter updated at bedside daily  Patient critically ill due to ARDS, shock, renal failure Interventions to address this today pressor/vent titration, vascular surgery consult Risk of deterioration without these interventions is high  I personally spent 85 minutes providing critical care not including any separately billable procedures  Myrla Halsted MD [facility]  Prefer epic messenger for cross cover needs If after hours, please call E-link

## 2021-03-26 NOTE — Progress Notes (Signed)
    Right UE hand with pink skin color, warm to touch.  Palpable "snuff box" pulse.  Brisk ulnar and palmer signals.  S/P Right radial artery embolectomy Heparin full dose  Mosetta Pigeon PA-C

## 2021-03-26 NOTE — Consult Note (Addendum)
CONSULT NOTE   MRN : [id number]  Reason for Consult: radial artery occlusion Referring Physician: Dr. Adaline Sill  History of Present Illness: 60y/o male with COVID unvaccinated.  AKI, ARDS COVID, alcoholic cirrhosis.  On exam yesterday they were unable to plapate a radial pulse on the right UE after the A-line removal.  Now with low platelet count as well.  He is on pressor support with Levophed.  Intubated.  History taken from chart review.   We have been consulted for exam of the right UE.  Dusky appearence after A line removed.       Current Facility-Administered Medications  Medication Dose Route Frequency Provider Last Rate Last Admin   0.9 %  sodium chloride infusion  250 mL Intravenous Continuous Pricilla Loveless, MD 10 mL/hr at 04/07/2021 0600 Infusion Verify at 06-Apr-2021 0600   0.9 %  sodium chloride infusion   Intra-arterial PRN Simonne Martinet, NP       albumin human 25 % solution 25 g  25 g Intravenous Q6H Lorin Glass, MD 60 mL/hr at 06-Apr-2021 0914 25 g at April 06, 2021 0914   albuterol (VENTOLIN HFA) 108 (90 Base) MCG/ACT inhaler 1 puff  1 puff Inhalation Q6H PRN Bevelyn Ngo, NP       artificial tears (LACRILUBE) ophthalmic ointment 1 application  1 application Both Eyes Q8H Lorin Glass, MD   1 application at 03/22/2021 1740   calcium gluconate 2 g/ 100 mL sodium chloride IVPB  2 g Intravenous Once Lorin Glass, MD       Chlorhexidine Gluconate Cloth 2 % PADS 6 each  6 each Topical Q0600 Lorin Glass, MD   6 each at 04/05/2021 [phone]   dexamethasone (DECADRON) injection 6 mg  6 mg Intravenous Q24H Pricilla Loveless, MD   6 mg at 04/02/2021 0920   docusate (COLACE) 50 MG/5ML liquid 100 mg  100 mg Per Tube BID Simonne Martinet, NP   100 mg at 03/09/2021 0919   feeding supplement (VITAL 1.5 CAL) liquid 1,000 mL  1,000 mL Per Tube Continuous Lorin Glass, MD 20 mL/hr at 03/25/21 1640 1,000 mL at 03/25/21 1640   fentaNYL (SUBLIMAZE) bolus via infusion 50-100 mcg  50-100 mcg  Intravenous Q15 min PRN Lorin Glass, MD   100 mcg at 03/25/21 0856   fentaNYL in NS (44mcg/ml) infusion-PREMIX  50-200 mcg/hr Intravenous Continuous Lorin Glass, MD 20 mL/hr at Apr 06, 2021 0600 200 mcg/hr at Apr 06, 2021 0600   heparin injection 5,000 Units  5,000 Units Subcutaneous Q8H Bevelyn Ngo, NP   5,000 Units at 04/07/2021 0449   insulin aspart (novoLOG) injection 0-15 Units  0-15 Units Subcutaneous Q4H Lorin Glass, MD   5 Units at 03/14/2021 0804   lactulose (CHRONULAC) 10 GM/15ML solution 20 g  20 g Per Tube BID Lorin Glass, MD       MEDLINE mouth rinse  15 mL Mouth Rinse 10 times per day Simonne Martinet, NP   15 mL at 06-Apr-2021 0921   meropenem (MERREM) 1 g in sodium chloride 0.9 % 100 mL IVPB  1 g Intravenous Q12H Lorin Glass, MD 200 mL/hr at 04/01/2021 0912 1 g at Apr 06, 2021 0912   midazolam (VERSED) 100 mg/100 mL (1 mg/mL) premix infusion  0-10 mg/hr Intravenous Continuous Lorin Glass, MD 4 mL/hr at 03/11/2021 0643 4 mg/hr at 03/13/2021 0643   midazolam (VERSED) bolus via infusion 0-5 mg  0-5 mg Intravenous Q1H  PRN Lorin GlassSmith, Daniel C, MD       midodrine (PROAMATINE) tablet 10 mg  10 mg Per Tube Q8H Lorin GlassSmith, Daniel C, MD       mometasone-formoterol [facility](DULERA) 200-5 MCG/ACT inhaler 2 puff  2 puff Inhalation BID Bevelyn NgoGroce, Sarah F, NP       norepinephrine (LEVOPHED) 16 mg in 250mL premix infusion  0-40 mcg/min Intravenous Titrated Link SnufferMillen, Jessica B, RPH 21.6 mL/hr at 03/23/2021 0923 23 mcg/min at 03/13/2021 0923   octreotide (SANDOSTATIN) injection 100 mcg  100 mcg Subcutaneous Q8H Lorin GlassSmith, Daniel C, MD       ondansetron [facility](ZOFRAN) injection 4 mg  4 mg Intravenous Q6H PRN Bevelyn NgoGroce, Sarah F, NP       pantoprazole (PROTONIX) injection 40 mg  40 mg Intravenous QHS Bevelyn NgoGroce, Sarah F, NP   40 mg at 03/25/21 2231   polyethylene glycol (MIRALAX / GLYCOLAX) packet 17 g  17 g Per Tube Daily Lorin GlassSmith, Daniel C, MD   17 g at 03/09/2021 0920   remdesivir 100 mg in sodium chloride 0.9 % 100 mL IVPB  100 mg  Intravenous Daily Pricilla LovelessGoldston, Scott, MD 200 mL/hr at 03/22/2021 1005 100 mg at 03/14/2021 1005   rocuronium (ZEMURON) injection 50 mg  50 mg Intravenous Q1H PRN Lorin GlassSmith, Daniel C, MD       sodium chloride flush (NS) 0.9 % injection 10-40 mL  10-40 mL Intracatheter Q12H Lorin GlassSmith, Daniel C, MD   10 mL at 03/10/2021 0959   sodium chloride flush (NS) 0.9 % injection 10-40 mL  10-40 mL Intracatheter PRN Lorin GlassSmith, Daniel C, MD       vasopressin (PITRESSIN) 20 Units in sodium chloride 0.9 % 100 mL infusion-*FOR SHOCK*  0.04 Units/min Intravenous Continuous Lorin GlassSmith, Daniel C, MD 12 mL/hr at 03/22/2021 1004 0.04 Units/min at 03/17/2021 1004    Pt meds include: Statin :No Betablocker: No ASA: No Other anticoagulants/antiplatelets: Heparin ordered to start today  Past Medical History:  Diagnosis Date   Cirrhosis of liver (HCC) 04/2018    Past Surgical History:  Procedure Laterality Date   COLONOSCOPY WITH PROPOFOL N/A 06/16/2018   Procedure: COLONOSCOPY WITH PROPOFOL;  Surgeon: Pasty Spillersahiliani, Varnita B, MD;  Location: [facility] ENDOSCOPY;  Service: Endoscopy;  Laterality: N/A;   ESOPHAGOGASTRODUODENOSCOPY (EGD) WITH PROPOFOL N/A 06/16/2018   Procedure: ESOPHAGOGASTRODUODENOSCOPY (EGD) WITH PROPOFOL;  Surgeon: Pasty Spillersahiliani, Varnita B, MD;  Location: [facility] ENDOSCOPY;  Service: Endoscopy;  Laterality: N/A;   GIVENS CAPSULE STUDY N/A 07/12/2018   Procedure: GIVENS CAPSULE STUDY;  Surgeon: Pasty Spillersahiliani, Varnita B, MD;  Location: [facility] ENDOSCOPY;  Service: Endoscopy;  Laterality: N/A;    Social History Social History   Tobacco Use   Smoking status: Every Day    Packs/day: 0.50    Years: 43.00    Pack years: 21.50    Types: Cigarettes   Smokeless tobacco: Former    Types: Chew   Tobacco comments:    previously smoked more than 1/2 ppd  Vaping Use   Vaping Use: Some days   Substances: Nicotine  Substance Use Topics   Alcohol use: Not Currently    Comment: sober - 2019   Drug use: Not Currently    Types: Marijuana, Oxycodone     Comment: none since 4420'    Family History Family History  Problem Relation Age of Onset   Heart failure Sister    Cervical cancer Sister    Breast cancer Other 149   Heart failure Other    Hypertension Father    Stroke Brother  No Known Allergies   REVIEW OF SYSTEMS  General: [ ]  Weight loss, [ ]  Fever, [ ]  chills Neurologic: [ ]  Dizziness, [ ]  Blackouts, [ ]  Seizure [ ]  Stroke, [ ]  "Mini stroke", [ ]  Slurred speech, [ ]  Temporary blindness; [ ]  weakness in arms or legs, [ ]  Hoarseness [ ]  Dysphagia Cardiac: [ ]  Chest pain/pressure, [ x] Shortness of breath at rest [ ]  Shortness of breath with exertion, [ ]  Atrial fibrillation or irregular heartbeat  Vascular: [ ]  Pain in legs with walking, [ ]  Pain in legs at rest, [ ]  Pain in legs at night,  [ ]  Non-healing ulcer, [ ]  Blood clot in vein/DVT,   Pulmonary: [ ]  Home oxygen, [ ]  Productive cough, [ ]  Coughing up blood, [ ]  Asthma,  [ ]  Wheezing [ ]  COPD Musculoskeletal:  [ ]  Arthritis, [ ]  Low back pain, [ ]  Joint pain Hematologic: [ ]  Easy Bruising, [ ]  Anemia; [ ]  Hepatitis [x]  cirrhosis Gastrointestinal: [ ]  Blood in stool, [ ]  Gastroesophageal Reflux/heartburn, Urinary: [ ]  chronic Kidney disease, [ ]  on HD - [ ]  MWF or [ ]  TTHS, [ ]  Burning with urination, [ ]  Difficulty urinating Skin: [ ]  Rashes, [ ]  Wounds Psychological: [ ]  Anxiety, [ ]  Depression  Physical Examination Vitals:   28-Mar-2021 0600 03-28-2021 0615 03/28/21 0630 Mar 28, 2021 0700  BP: (!) 100/49 (!) 102/54 (!) 98/53   Pulse: 72 73 71   Resp: (!) 34 (!) 34 (!) 34   Temp:    98.8 F (37.1 C)  TempSrc:    Bladder  SpO2: 100% 100% 100%   Weight:      Height:       Body mass index is 31.09 kg/m.  General:  ill appearing  Pulmonary: Intubated COVID ARDS Cardiac: RRR, without  Murmurs, rubs or gallops; No carotid bruits Abdomen: soft, NT, no masses Skin: no rashes, ulcers noted;  no Gangrene , no cellulitis; no open wounds;   Vascular Exam/Pulses:  Distal to arterial stick site I was able to doppler both radial and ulnar signals.  I was unable to get a palmer arch signal.  His hand is warm with slight decreased warmth in finger tips.  Cap refill <3 sec at finger tips.   Musculoskeletal: no muscle wasting or atrophy; positiveglobal edema      Significant Diagnostic Studies: CBC Lab Results  Component Value Date   WBC 17.8 (H) 03-28-2021   HGB 10.9 (L) Mar 28, 2021   HCT 32.0 (L) 03/28/21   MCV 105.6 (H) 03-28-2021   PLT 55 (L) Mar 28, 2021    BMET    Component Value Date/Time   NA 131 (L) 03/28/21 0626   NA 138 01/07/2021 1441   K 4.4 March 28, 2021 0626   CL 95 (L) 28-Mar-2021 0234   CO2 21 (L) 03/28/21 0234   GLUCOSE 215 (H) 2021/03/28 0234   BUN 83 (H) Mar 28, 2021 0234   BUN 11 01/07/2021 1441   CREATININE 2.91 (H) 03-28-2021 0234   CALCIUM 6.7 (L) 03/28/2021 0234   GFRNONAA 24 (L) 2021-03-28 0234   GFRAA 121 09/18/2020 1621   Estimated Creatinine Clearance: 31.5 mL/min (A) (by C-G formula based on SCr of 2.91 mg/dL (H)).  COAG Lab Results  Component Value Date   INR 1.3 (H) 03/28/2021   INR 1.1 01/07/2021   INR 1.0 09/18/2020     Non-Invasive Vascular Imaging: pending right UE arterial duplex  ASSESSMENT/PLAN:   COVID ARDS on ventilator  Right  hand dusky appearance Doppler signals Distal to arterial stick site radial and ulnar.  Cap refill in finger tips < 3 secs. I will order right UE arterial duplex and CCM is starting Heparin.   He is on Levophed pressor support 23 mcg.   We will follow duplex.   Mosetta Pigeon 03/22/2021 10:22 AM   I have independently interviewed and examined patient and agree with PA assessment and plan above. I discussed the case with the patient's daughter and she appears to have good understanding.  Patient appears to have incomplete arch as right middle, ring and small finger are well perfused with good cap refill but index and thumb appear dusky.  There is a good  radial and ulnar signal at the wrist no radial signal can be traced onto the hand.  This appears to be related to thrombosis of the radial artery at the site of arterial cannulation for hemodynamic monitoring.  This may also be related to COVID-19.  We will plan to initiate heparin and obtain right upper extremity duplex.  If he has no improvement we will plan for right upper extremity thrombectomy of right radial artery  Kiki Bivens C. Randie Heinz, MD [facility] Office: [phone] Pager: [phone]  Addendum:   Right Doppler Findings:  +--------------+---------+----------------------------------+--------+-----  ---+  Site          PSV      Waveform                            StenosisComments                (cm/s)                                                        +--------------+---------+----------------------------------+--------+-----  ---+  Subclavian    103      triphasic                                            Prox                                                                        +--------------+---------+----------------------------------+--------+-----  ---+  Axillary      91       triphasic                                            +--------------+---------+----------------------------------+--------+-----  ---+  Brachial Prox 68       triphasic                                            +--------------+---------+----------------------------------+--------+-----  ---+  Brachial Dist 100  triphasic                                            +--------------+---------+----------------------------------+--------+-----  ---+  Radial Mid    25       triphasic; decreased deceleration                                           time                                                 +--------------+---------+----------------------------------+--------+-----  ---+  Radial Dist                                               occluded           +--------------+---------+----------------------------------+--------+-----  ---+  Ulnar Prox    117      triphasic                                            +--------------+---------+----------------------------------+--------+-----  ---+  Ulnar Mid     95       triphasic                                            +--------------+---------+----------------------------------+--------+-----  ---+  Ulnar Dist    79       triphasic                                            +--------------+---------+----------------------------------+--------+-----  ---+  Palmar Arch                                              occluded           +--------------+---------+----------------------------------+--------+-----   Summary:     Right: Total occlusion visualized in the right radial artery and palmar arch.    I have reviewed duplex and re-evaluated the patient's hand which continues to appear dusky despite heparin initiation. Plan is for right radial artery thrombectomy in OR today. Daughter is in agreement by phone.    Areatha Kalata C. Randie Heinz, MD

## 2021-03-26 NOTE — Progress Notes (Signed)
ANTICOAGULATION CONSULT NOTE - Initial Consult  Pharmacy Consult for heparin dosing Indication: Radial artery occlusion   No Known Allergies  Patient Measurements: Height: 5\' 9"  (175.3 cm) Weight: 95.5 kg (210 lb 8.6 oz) IBW/kg (Calculated) : 70.7 Heparin Dosing Weight: 88.7  Vital Signs: Temp: 99 F (37.2 C) (08/18 2000) Temp Source: Bladder (08/18 1100) BP: 100/51 (08/18 2000) Pulse Rate: 65 (08/18 2000)  Labs: Recent Labs    04/04/2021 0757 04/07/2021 0944 04/01/2021 1236 03/17/2021 2149 03/25/21 0229 03/25/21 1648 04/05/2021 0234 03/30/2021 0626 03/29/2021 1000 03/25/2021 1942  HGB 14.6  --  14.1   < > 14.2 12.9* 11.2* 10.9*  --   --   HCT 42.9  --  39.7   < > 42.5 38.0* 32.1* 32.0*  --   --   PLT 72*  --  97*  --  106*  --  55*  --  PLATELET CLUMPS NOTED ON SMEAR, UNABLE TO ESTIMATE  --   APTT 33  --   --   --   --   --   --   --  38*  --   LABPROT 16.5*  --   --   --   --   --   --   --  19.2*  --   INR 1.3*  --   --   --   --   --   --   --  1.6*  --   HEPARINUNFRC  --   --   --   --   --   --   --   --   --  0.22*  CREATININE 2.41*  --  1.79*  --  2.34*  --  2.91*  --   --   --   TROPONINIHS 7 6  --   --   --   --   --   --   --   --    < > = values in this interval not displayed.     Estimated Creatinine Clearance: 31.5 mL/min (A) (by C-G formula based on SCr of 2.91 mg/dL (H)).   Medical History: Past Medical History:  Diagnosis Date   Cirrhosis of liver (HCC) 04/2018     Assessment: 60 yo M on heparin for Radial artery occlusion related to arterial line now s/p removal and embolectomy 8/18. No anticoagulation prior to admission.   HL 0.22 is subtherapeutic. Unclear if heparin was stopped during procedure, no MAR documentation.  No issues with infusion or bleeding per RN. Noted low platelets likely due to alcoholic cirrhosis, sepsis, dilution, and active clot.    Goal of Therapy:  Heparin level 0.3-0.7 Monitor platelets by anticoagulation protocol: Yes    Plan: Increase heparin to 1600 units/hr Monitor daily HL, CBC/plt Monitor for signs/symptoms of bleeding    Thank you for allowing pharmacy to participate in this patient's care.  9/18, PharmD, BCPS, BCCP Clinical Pharmacist  Please check AMION for all [facility] Pharmacy phone numbers After 10:00 PM, call Main Pharmacy [phone]

## 2021-03-26 NOTE — Anesthesia Preprocedure Evaluation (Addendum)
Anesthesia Evaluation  Patient identified by MRN, date of birth, ID band Patient unresponsive    Reviewed: Patient's Chart, lab work & pertinent test results  Airway Mallampati: Intubated       Dental no notable dental hx.    Pulmonary pneumonia, unresolved, Current Smoker,  ARDS 2/2 COVID PNA unvaccinated intubated sedated on levophed/vasopressin    + decreased breath sounds      Cardiovascular  Rhythm:Regular Rate:Normal  shock   Neuro/Psych negative neurological ROS  negative psych ROS   GI/Hepatic negative GI ROS, (+) Cirrhosis     substance abuse  alcohol use,   Endo/Other  negative endocrine ROS  Renal/GU negative Renal ROS  negative genitourinary   Musculoskeletal negative musculoskeletal ROS (+)   Abdominal (+) + obese,   Peds  Hematology  (+) anemia ,   Anesthesia Other Findings Right radial artery complete occlusion on dopplers 8/18, 2/2 to recent radial arterial line placement.   Reproductive/Obstetrics                            Anesthesia Physical Anesthesia Plan  ASA: 4 and emergent  Anesthesia Plan: General   Post-op Pain Management:    Induction: Intravenous  PONV Risk Score and Plan: 1 and Ondansetron and Treatment may vary due to age or medical condition  Airway Management Planned: Oral ETT  Additional Equipment: None  Intra-op Plan:   Post-operative Plan: Post-operative intubation/ventilation  Informed Consent:     History available from chart only  Plan Discussed with: CRNA  Anesthesia Plan Comments: (Lab Results      Component                Value               Date                      WBC                      17.8 (H)            04/16/2021                HGB                      10.9 (L)            04/16/21                HCT                      32.0 (L)            04/16/21                MCV                      105.6 (H)            16-Apr-2021                PLT                                          April 16, 2021            PLATELET CLUMPS NOTED ON SMEAR, UNABLE TO ESTIMATE Lab  Results      Component                Value               Date                      NA                       131 (L)             03/27/2021                K                        4.4                 03/15/2021                CO2                      21 (L)              03/22/2021                GLUCOSE                  215 (H)             04/05/2021                BUN                      83 (H)              03/16/2021                CREATININE               2.91 (H)            03/25/2021                CALCIUM                  6.7 (L)             03/25/2021                GFRNONAA                 24 (L)              03/31/2021                GFRAA                    121                 09/18/2020           ECHO 03/25/21: 1. Left ventricular ejection fraction, by estimation, is 55 to 60%. The  left ventricle has normal function. Left ventricular endocardial border  not optimally defined to evaluate regional wall motion. Left ventricular  diastolic function could not be  evaluated.  2. Right ventricular systolic function is normal. The right ventricular  size is normal. There is normal pulmonary artery systolic pressure.  3. The mitral valve is normal in structure. Trivial mitral valve  regurgitation. No evidence of mitral stenosis.  4. The aortic valve  is tricuspid. Aortic valve regurgitation is not  visualized. No aortic stenosis is present.  5. The inferior vena cava is dilated in size with <50% respiratory  variability, suggesting right atrial pressure of 15 mmHg. )       Anesthesia Quick Evaluation

## 2021-03-26 NOTE — Progress Notes (Signed)
ANTICOAGULATION CONSULT NOTE - Initial Consult  Pharmacy Consult for heparin dosing Indication:  Catheter thrombosis extending into vasculature  No Known Allergies  Patient Measurements: Height: 5\' 9"  (175.3 cm) Weight: 95.5 kg (210 lb 8.6 oz) IBW/kg (Calculated) : 70.7 Heparin Dosing Weight: 88.7  Vital Signs: Temp: 98.8 F (37.1 C) (08/18 0700) Temp Source: Bladder (08/18 0700) BP: 103/57 (08/18 1000) Pulse Rate: 74 (08/18 1000)  Labs: Recent Labs    03/17/2021 0757 03/23/2021 0944 03/10/2021 1236 03/21/2021 2149 03/25/21 0229 03/25/21 1648 03/25/2021 0234 03/19/2021 0626 04/02/2021 1000  HGB 14.6  --  14.1   < > 14.2 12.9* 11.2* 10.9*  --   HCT 42.9  --  39.7   < > 42.5 38.0* 32.1* 32.0*  --   PLT 72*  --  97*  --  106*  --  55*  --  PLATELET CLUMPS NOTED ON SMEAR, UNABLE TO ESTIMATE  APTT 33  --   --   --   --   --   --   --  38*  LABPROT 16.5*  --   --   --   --   --   --   --  19.2*  INR 1.3*  --   --   --   --   --   --   --  1.6*  CREATININE 2.41*  --  1.79*  --  2.34*  --  2.91*  --   --   TROPONINIHS 7 6  --   --   --   --   --   --   --    < > = values in this interval not displayed.    Estimated Creatinine Clearance: 31.5 mL/min (A) (by C-G formula based on SCr of 2.91 mg/dL (H)).   Medical History: Past Medical History:  Diagnosis Date   Cirrhosis of liver (HCC) 04/2018    Medications:  Scheduled:   artificial tears  1 application Both Eyes Q8H   Chlorhexidine Gluconate Cloth  6 each Topical Q0600   dexamethasone (DECADRON) injection  6 mg Intravenous Q24H   docusate  100 mg Per Tube BID   heparin  5,000 Units Subcutaneous Q8H   insulin aspart  0-15 Units Subcutaneous Q4H   lactulose  20 g Per Tube BID   mouth rinse  15 mL Mouth Rinse 10 times per day   midodrine  10 mg Per Tube Q8H   mometasone-formoterol  2 puff Inhalation BID   octreotide  100 mcg Subcutaneous Q8H   pantoprazole (PROTONIX) IV  40 mg Intravenous QHS   polyethylene glycol  17 g Per  Tube Daily   sodium chloride flush  10-40 mL Intracatheter Q12H   vancomycin variable dose per unstable renal function (pharmacist dosing)   Does not apply See admin instructions    Assessment: MD wishes to try heparin therapy before attempting thrombectomy of the catheter. Patient is currently in septic shock complicated by hepatic and acute kidney injury. With the most recent INR being elevated at 1.6 and low platelet count, the bolus dose will be omitted.  Goal of Therapy:  Heparin level 0.3-0.7 Monitor platelets by anticoagulation protocol: Yes   Plan:  Omit bolus due to platelet count of 55 and elevated INR Start heparin infusion at 1500 units/hr Will check heparin level in 8 hours Monitor CBC, H/H, and signs and symptoms of bleeding  Thank you for allowing pharmacy to participate in this patient's care.  05/2018, PharmD PGY1  Pharmacy Resident 03/29/2021 12:03 PM Check [email or web address] for unit specific pharmacy number

## 2021-03-26 NOTE — Progress Notes (Signed)
Pt head turned to the left at this time. Vital sign stable.

## 2021-03-26 NOTE — Anesthesia Postprocedure Evaluation (Signed)
Anesthesia Post Note  Patient: Edwin West  Procedure(s) Performed: THROMBECTOMY RIGHT ARM (Right)     Patient location during evaluation: SICU Anesthesia Type: General Level of consciousness: sedated Pain management: pain level controlled Vital Signs Assessment: post-procedure vital signs reviewed and stable Respiratory status: patient remains intubated per anesthesia plan Cardiovascular status: stable Postop Assessment: no apparent nausea or vomiting Anesthetic complications: no   No notable events documented.  Last Vitals:  Vitals:   03/16/2021 1200 03/19/2021 1600  BP:    Pulse:  73  Resp:  (!) 34  Temp:    SpO2: 98% 96%    Last Pain:  Vitals:   03/11/2021 1100  TempSrc: Bladder  PainSc:                  Edwin West

## 2021-03-26 NOTE — Progress Notes (Signed)
1430- pt taken to OR via vent w/ no apparent respiratory complications.  Vent settings reported to CRNA. 1600- pt transported back to ICU via vent w/ no apparent respiratory complications.

## 2021-03-26 NOTE — Progress Notes (Signed)
ABG results given to RN. 

## 2021-03-26 NOTE — Progress Notes (Signed)
Pt placed in the supine position at this time. ABG will be obtained in an hour.

## 2021-03-26 NOTE — Progress Notes (Signed)
Right upper extremity arterial duplex completed. Refer to "CV Proc" under chart review to view preliminary results.  Preliminary results discussed with Dr. Katrinka Blazing.  03/14/2021 12:15 PM Eula Fried., MHA, RVT, RDCS, RDMS

## 2021-03-26 NOTE — Progress Notes (Signed)
Nutrition Follow-up  DOCUMENTATION CODES:   Not applicable  INTERVENTION:   After OR, advance tube feeds to goal via post-pyloric Cortrak tube: - Vital 1.5 @ 45 ml/hr (1080 ml/day) - ProSource TF 90 ml TID   Tube feeding regimen at goal rate provides 1860 kcal, 139 grams of protein, and 825 ml of H2O.  NUTRITION DIAGNOSIS:   Increased nutrient needs related to acute illness (COVID-19 ARDS) as evidenced by estimated needs.  Ongoing, being addressed via TF  GOAL:   Patient will meet greater than or equal to 90% of their needs  Met via TF at goal  MONITOR:   Vent status, Labs, Weight trends, TF tolerance, I & O's  REASON FOR ASSESSMENT:   Ventilator, Consult Enteral/tube feeding initiation and management  ASSESSMENT:   60 year old male who presented to the ED on 8/16 with SOB. Pt tested positive for COVID-19. PMH of cirrhosis. Pt admitted with COVID ARDS and required intubation. Pt also with decompensated alcoholic cirrhosis and AKI.  8/16 - intubated 8/17 - Cortrak placed (tip post-pyloric), TF started at trickle rate  Pt with ARDS secondary to streptococcal/pseudomonal PNA and COVID. Pt also with septic shock secondary to PNA with bacteremia.  Discussed pt with RN and during ICU rounds. Pt is currently supine with no plans to prone today. Pt with dusky appearance of right hand. Plan is for right radial artery thrombectomy in OR today.  CCM okay with advancing tube feeding regimen to goal via post-pyloric Cortrak.  Admit weight: 89.4 kg Current weight: 95.5 kg  Patient is currently intubated on ventilator support MV: 17.8 L/min Temp (24hrs), Avg:98.4 F (36.9 C), Min:96.6 F (35.9 C), Max:99.9 F (37.7 C) BP (cuff): 115/62 MAP (cuff): 76  Drips: Versed Fentanyl Levophed Vasopressin Heparin Sodium bicarb: 75 ml/hr NS: 10 ml/hr  Medications reviewed and include: IV decadron, colace, SSI q 4 hours, lactulose 20 mg BID, octreotide, IV protonix, miralax,  IV albumin, IV abx, remdesivir  Labs reviewed: sodium 131, BUN 83, creatinine 2.91, ionized calcium 0.89, phosphorus 8.1 CBG's: 127-250 x 24 hours  UOP: 700 ml x 24 hours I/O's: +10.1 L since admit  Diet Order:   Diet Order             Diet NPO time specified  Diet effective now                   EDUCATION NEEDS:   No education needs have been identified at this time  Skin:  Skin Assessment: Reviewed RN Assessment  Last BM:  03/16/2021 small type 7  Height:   Ht Readings from Last 1 Encounters:  03/27/2021 '5\' 9"'  (1.753 m)    Weight:   Wt Readings from Last 1 Encounters:  03/17/2021 95.5 kg    BMI:  Body mass index is 31.09 kg/m.  Estimated Nutritional Needs:   Kcal:  1700-1900  Protein:  125-145 grams  Fluid:  >/= 2.0 L    Gustavus Bryant, MS, RD, LDN Inpatient Clinical Dietitian Please see AMiON for contact information.

## 2021-03-26 NOTE — Transfer of Care (Signed)
Immediate Anesthesia Transfer of Care Note  Patient: Chrstopher Malenfant  Procedure(s) Performed: THROMBECTOMY RIGHT ARM (Right)  Patient Location: ICU  Anesthesia Type:General  Level of Consciousness: Patient remains intubated per anesthesia plan  Airway & Oxygen Therapy: Patient remains intubated per anesthesia plan and Patient placed on Ventilator (see vital sign flow sheet for setting)  Post-op Assessment: Report given to RN and Post -op Vital signs reviewed and stable  Post vital signs: Reviewed and stable  Last Vitals:  Vitals Value Taken Time  BP 120/56 03/13/2021 1617  Temp 37.6 C 03/27/2021 1620  Pulse 71 03/16/2021 1620  Resp 34 03/31/2021 1620  SpO2 96 % 04/08/2021 1620  Vitals shown include unvalidated device data.  Last Pain:  Vitals:   03/29/2021 1100  TempSrc: Bladder  PainSc:          Complications: No notable events documented.

## 2021-03-26 NOTE — Progress Notes (Addendum)
Pharmacy Antibiotic Note  Edwin West is a 60 y.o. male admitted on 2021/04/19 with septic shock secondary to  COVID 19 pneumonia, Pseudomonas pneumonia, and Streptococcus pneumoniae bacteremia . The patient is currently requiring escalating doses of norepinephrine in addition to vasopressin. Pharmacy has been consulted for Meropenem and Vancomycin dosing in the setting of reduced renal function. The patient's current creatinine clearance is 31.5 mL/min with low urine output. Meropenem will be used to cover both species of bacteria and a possible unknown organism. Empiric vancomycin will be started at variable dosing due to the patients unstable renal function in the setting of septic shock and suspected HRS-AKI. Sensitivities resulted pan-susceptible for both organisms; however MD wishes to continue broad coverage for 1 more day to rule out unknown resistant organism. COVID-19 is contributing to septic shock and respiratory failure.  Assessment: Patient showing no signs of improvement on Cefepime. MD wishes to add vancomycin and escalate to meropenem for suspected resistance. Vancomycin random after initial dosing in the ED was 9. Patient will be re-dosed and another vancomycin random will be checked 8/19.   Plan: Start meropenem 1 gram every 12 hours. Give vancomycin 1500 mg x 1 and check vancomycin random 8/19 Will follow susceptibilities and adjust if necessary.  Height: 5\' 9"  (175.3 cm) Weight: 95.5 kg (210 lb 8.6 oz) IBW/kg (Calculated) : 70.7  Temp (24hrs), Avg:98.1 F (36.7 C), Min:96.6 F (35.9 C), Max:98.8 F (37.1 C)  Recent Labs  Lab 04/19/21 0757 19-Apr-2021 0944 04/19/2021 1236 03/25/21 0229 03/25/21 1031 03/25/21 2205 04/04/2021 0234 04/08/2021 0806  WBC 0.9*  --  1.1* 6.2  --   --  17.8*  --   CREATININE 2.41*  --  1.79* 2.34*  --   --  2.91*  --   LATICACIDVEN 6.5* 5.9*  --   --  5.2* 5.5*  --   --   VANCORANDOM  --   --   --   --   --   --   --  9     Estimated  Creatinine Clearance: 31.5 mL/min (A) (by C-G formula based on SCr of 2.91 mg/dL (H)).    No Known Allergies  Antimicrobials this admission: Vancomycin 8/16 >> 8/17, 8/18>> Meropenem 8/16 >> 8/16, 8/18>> Azithromycin x 1 on 8/16 Ceftriaxone 8/16 >>8/17 Cefepime 8/17>>8/18   Microbiology results: 8/16 BCx: S. pneumoniae 8/16 Sputum: S. pneumoniae, P. aeruginosa   8/17 MRSA PCR: negative  Thank you for allowing pharmacy to participate in this patient's care.  9/17, PharmD PGY1 Pharmacy Resident 03/25/2021 10:23 AM Check [email or web address] for unit specific pharmacy number

## 2021-03-27 ENCOUNTER — Encounter (HOSPITAL_COMMUNITY): Payer: Self-pay | Admitting: Vascular Surgery

## 2021-03-27 DIAGNOSIS — J1282 Pneumonia due to coronavirus disease 2019: Secondary | ICD-10-CM | POA: Diagnosis not present

## 2021-03-27 DIAGNOSIS — J9601 Acute respiratory failure with hypoxia: Secondary | ICD-10-CM | POA: Diagnosis not present

## 2021-03-27 DIAGNOSIS — U071 COVID-19: Secondary | ICD-10-CM | POA: Diagnosis not present

## 2021-03-27 LAB — POCT I-STAT 7, (LYTES, BLD GAS, ICA,H+H)
Acid-Base Excess: 2 mmol/L (ref 0.0–2.0)
Acid-Base Excess: 2 mmol/L (ref 0.0–2.0)
Acid-Base Excess: 2 mmol/L (ref 0.0–2.0)
Bicarbonate: 28.4 mmol/L — ABNORMAL HIGH (ref 20.0–28.0)
Bicarbonate: 29.5 mmol/L — ABNORMAL HIGH (ref 20.0–28.0)
Bicarbonate: 27.9 mmol/L (ref 20.0–28.0)
Calcium, Ion: 0.95 mmol/L — ABNORMAL LOW (ref 1.15–1.40)
Calcium, Ion: 0.93 mmol/L — ABNORMAL LOW (ref 1.15–1.40)
Calcium, Ion: 0.95 mmol/L — ABNORMAL LOW (ref 1.15–1.40)
HCT: 29 % — ABNORMAL LOW (ref 39.0–52.0)
HCT: 29 % — ABNORMAL LOW (ref 39.0–52.0)
HCT: 28 % — ABNORMAL LOW (ref 39.0–52.0)
Hemoglobin: 9.9 g/dL — ABNORMAL LOW (ref 13.0–17.0)
Hemoglobin: 9.9 g/dL — ABNORMAL LOW (ref 13.0–17.0)
Hemoglobin: 9.5 g/dL — ABNORMAL LOW (ref 13.0–17.0)
O2 Saturation: 84 %
O2 Saturation: 90 %
O2 Saturation: 89 %
Patient temperature: 96.6
Patient temperature: 100.6
Patient temperature: 100.4
Potassium: 3.8 mmol/L (ref 3.5–5.1)
Potassium: 4.1 mmol/L (ref 3.5–5.1)
Potassium: 4.1 mmol/L (ref 3.5–5.1)
Sodium: 134 mmol/L — ABNORMAL LOW (ref 135–145)
Sodium: 136 mmol/L (ref 135–145)
Sodium: 139 mmol/L (ref 135–145)
TCO2: 30 mmol/L (ref 22–32)
TCO2: 31 mmol/L (ref 22–32)
TCO2: 29 mmol/L (ref 22–32)
pCO2 arterial: 53.2 mmHg — ABNORMAL HIGH (ref 32.0–48.0)
pCO2 arterial: 64.3 mmHg — ABNORMAL HIGH (ref 32.0–48.0)
pCO2 arterial: 54.1 mmHg — ABNORMAL HIGH (ref 32.0–48.0)
pH, Arterial: 7.331 — ABNORMAL LOW (ref 7.350–7.450)
pH, Arterial: 7.275 — ABNORMAL LOW (ref 7.350–7.450)
pH, Arterial: 7.324 — ABNORMAL LOW (ref 7.350–7.450)
pO2, Arterial: 50 mmHg — ABNORMAL LOW (ref 83.0–108.0)
pO2, Arterial: 72 mmHg — ABNORMAL LOW (ref 83.0–108.0)
pO2, Arterial: 67 mmHg — ABNORMAL LOW (ref 83.0–108.0)

## 2021-03-27 LAB — CBC
HCT: 27 % — ABNORMAL LOW (ref 39.0–52.0)
HCT: 26.8 % — ABNORMAL LOW (ref 39.0–52.0)
Hemoglobin: 9.7 g/dL — ABNORMAL LOW (ref 13.0–17.0)
Hemoglobin: 9.3 g/dL — ABNORMAL LOW (ref 13.0–17.0)
MCH: 37.6 pg — ABNORMAL HIGH (ref 26.0–34.0)
MCH: 35.6 pg — ABNORMAL HIGH (ref 26.0–34.0)
MCHC: 35.9 g/dL (ref 30.0–36.0)
MCHC: 34.7 g/dL (ref 30.0–36.0)
MCV: 104.7 fL — ABNORMAL HIGH (ref 80.0–100.0)
MCV: 102.7 fL — ABNORMAL HIGH (ref 80.0–100.0)
Platelets: 44 10*3/uL — ABNORMAL LOW (ref 150–400)
Platelets: 43 10*3/uL — ABNORMAL LOW (ref 150–400)
RBC: 2.58 MIL/uL — ABNORMAL LOW (ref 4.22–5.81)
RBC: 2.61 MIL/uL — ABNORMAL LOW (ref 4.22–5.81)
RDW: 15.5 % (ref 11.5–15.5)
RDW: 15.2 % (ref 11.5–15.5)
WBC: 15.3 10*3/uL — ABNORMAL HIGH (ref 4.0–10.5)
WBC: 16.8 10*3/uL — ABNORMAL HIGH (ref 4.0–10.5)
nRBC: 0.9 % — ABNORMAL HIGH (ref 0.0–0.2)
nRBC: 1.2 % — ABNORMAL HIGH (ref 0.0–0.2)

## 2021-03-27 LAB — COMPREHENSIVE METABOLIC PANEL
ALT: 36 U/L (ref 0–44)
AST: 91 U/L — ABNORMAL HIGH (ref 15–41)
Albumin: 2.8 g/dL — ABNORMAL LOW (ref 3.5–5.0)
Alkaline Phosphatase: 116 U/L (ref 38–126)
Anion gap: 11 (ref 5–15)
BUN: 118 mg/dL — ABNORMAL HIGH (ref 6–20)
CO2: 26 mmol/L (ref 22–32)
Calcium: 6.8 mg/dL — ABNORMAL LOW (ref 8.9–10.3)
Chloride: 95 mmol/L — ABNORMAL LOW (ref 98–111)
Creatinine, Ser: 2.63 mg/dL — ABNORMAL HIGH (ref 0.61–1.24)
GFR, Estimated: 27 mL/min — ABNORMAL LOW (ref >60)
Glucose, Bld: 306 mg/dL — ABNORMAL HIGH (ref 70–99)
Potassium: 4 mmol/L (ref 3.5–5.1)
Sodium: 132 mmol/L — ABNORMAL LOW (ref 135–145)
Total Bilirubin: 5.1 mg/dL — ABNORMAL HIGH (ref 0.3–1.2)
Total Protein: 5.2 g/dL — ABNORMAL LOW (ref 6.5–8.1)

## 2021-03-27 LAB — HEPARIN LEVEL (UNFRACTIONATED)
Heparin Unfractionated: 0.27 IU/mL — ABNORMAL LOW (ref 0.30–0.70)
Heparin Unfractionated: 0.17 IU/mL — ABNORMAL LOW (ref 0.30–0.70)

## 2021-03-27 LAB — PROTIME-INR
INR: 1.8 — ABNORMAL HIGH (ref 0.8–1.2)
Prothrombin Time: 21 seconds — ABNORMAL HIGH (ref 11.4–15.2)

## 2021-03-27 LAB — GLUCOSE, CAPILLARY
Glucose-Capillary: 266 mg/dL — ABNORMAL HIGH (ref 70–99)
Glucose-Capillary: 275 mg/dL — ABNORMAL HIGH (ref 70–99)
Glucose-Capillary: 327 mg/dL — ABNORMAL HIGH (ref 70–99)
Glucose-Capillary: 296 mg/dL — ABNORMAL HIGH (ref 70–99)
Glucose-Capillary: 264 mg/dL — ABNORMAL HIGH (ref 70–99)
Glucose-Capillary: 179 mg/dL — ABNORMAL HIGH (ref 70–99)
Glucose-Capillary: 147 mg/dL — ABNORMAL HIGH (ref 70–99)
Glucose-Capillary: 180 mg/dL — ABNORMAL HIGH (ref 70–99)

## 2021-03-27 LAB — VANCOMYCIN, RANDOM: Vancomycin Rm: 16

## 2021-03-27 LAB — LACTIC ACID, PLASMA: Lactic Acid, Venous: 2.5 mmol/L (ref 0.5–1.9)

## 2021-03-27 MED ORDER — INSULIN DETEMIR 100 UNIT/ML ~~LOC~~ SOLN
5.0000 [IU] | Freq: Two times a day (BID) | SUBCUTANEOUS | Status: DC
  Administered 2021-03-27 – 2021-03-28 (×3): 5 [IU] via SUBCUTANEOUS
  Filled 2021-03-27 (×4): qty 0.05

## 2021-03-27 MED ORDER — LACTULOSE 10 GM/15ML PO SOLN
20.0000 g | Freq: Three times a day (TID) | ORAL | Status: DC
  Administered 2021-03-27 – 2021-04-01 (×14): 20 g
  Filled 2021-03-27 (×14): qty 30

## 2021-03-27 MED ORDER — VANCOMYCIN HCL 750 MG/150ML IV SOLN
750.0000 mg | Freq: Once | INTRAVENOUS | Status: AC
  Administered 2021-03-27: 750 mg via INTRAVENOUS
  Filled 2021-03-27: qty 150

## 2021-03-27 MED ORDER — SENNOSIDES 8.8 MG/5ML PO SYRP
5.0000 mL | ORAL_SOLUTION | Freq: Every day | ORAL | Status: DC
  Administered 2021-03-27 – 2021-04-01 (×6): 5 mL
  Filled 2021-03-27 (×6): qty 5

## 2021-03-27 MED ORDER — NOREPINEPHRINE 16 MG/250ML-% IV SOLN
0.0000 ug/min | INTRAVENOUS | Status: DC
  Administered 2021-03-27 (×2): 14 ug/min via INTRAVENOUS
  Administered 2021-03-29: 4 ug/min via INTRAVENOUS
  Filled 2021-03-27: qty 250

## 2021-03-27 MED ORDER — NOREPINEPHRINE 4 MG/250ML-% IV SOLN
0.0000 ug/min | INTRAVENOUS | Status: DC
Start: 2021-03-27 — End: 2021-03-27

## 2021-03-27 MED ORDER — INSULIN ASPART 100 UNIT/ML IJ SOLN
4.0000 [IU] | Freq: Four times a day (QID) | INTRAMUSCULAR | Status: DC
  Administered 2021-03-27 – 2021-03-30 (×13): 4 [IU] via SUBCUTANEOUS

## 2021-03-27 MED ORDER — ALBUMIN HUMAN 25 % IV SOLN
25.0000 g | Freq: Four times a day (QID) | INTRAVENOUS | Status: AC
Start: 2021-03-27 — End: 2021-03-28
  Administered 2021-03-27 – 2021-03-28 (×3): 25 g via INTRAVENOUS
  Filled 2021-03-27 (×3): qty 100

## 2021-03-27 NOTE — Progress Notes (Signed)
ANTICOAGULATION CONSULT NOTE  Pharmacy Consult for heparin dosing Indication:  Catheter thrombosis extending into vasculature  s/p thrombectomy on 8/19  No Known Allergies  Patient Measurements: Height: 5\' 9"  (175.3 cm) Weight: 96.8 kg (213 lb 6.5 oz) IBW/kg (Calculated) : 70.7 Heparin Dosing Weight: 88.7  Vital Signs: Temp: 100.4 F (38 C) (08/19 1815) Temp Source: Bladder (08/19 2000) BP: 126/63 (08/19 1815) Pulse Rate: 79 (08/19 1815)  Labs: Recent Labs    03/25/21 0229 03/25/21 1648 04-05-21 0234 2021/04/05 0626 04/05/21 1000 05-Apr-2021 1942 03/27/21 0439 03/27/21 0700 03/27/21 0958 03/27/21 1406 03/27/21 1822 03/27/21 2030  HGB 14.2   < > 11.2*   < >  --   --  9.7*  --    < > 9.9* 9.5* 9.3*  HCT 42.5   < > 32.1*   < >  --   --  27.0*  --    < > 29.0* 28.0* 26.8*  PLT 106*  --  55*  --  PLATELET CLUMPS NOTED ON SMEAR, UNABLE TO ESTIMATE  --  44*  --   --   --   --  43*  APTT  --   --   --   --  38*  --   --   --   --   --   --   --   LABPROT  --   --   --   --  19.2*  --  21.0*  --   --   --   --   --   INR  --   --   --   --  1.6*  --  1.8*  --   --   --   --   --   HEPARINUNFRC  --   --   --   --   --  0.22* 0.27*  --   --   --   --  0.17*  CREATININE 2.34*  --  2.91*  --   --   --   --  2.63*  --   --   --   --    < > = values in this interval not displayed.     Estimated Creatinine Clearance: 35.1 mL/min (A) (by C-G formula based on SCr of 2.63 mg/dL (H)).  Assessment: Patient is s/p thrombectomy on 8/18. Patient is currently in septic shock complicated by hepatic and acute kidney injury. Patient has low platelet count of 44 and indication for systemic anticoagulation. Discussed with MD and, in the setting of recent thrombosis,septic shock, and COVID-19, the new heparin level goal will be 0.1-0.25 while platelets remain <50. If platelets drop <25, IV heparin will be held.   Heparin level is therapeutic at 0.17 on 1400 units/hr. No bleeding noted.  Goal of  Therapy:  Heparin level 0.1-0.25 units/ml while platelets are <50 Monitor platelets by anticoagulation protocol: Yes   Plan:  Continue heparin drip at 1400 units/hr Daily heparin level and CBC Monitor CBC, H/H, and signs and symptoms of bleeding  Thank you for involving pharmacy in this patient's care.  09-29-1972, PharmD, BCPS Clinical Pharmacist Clinical phone for 03/27/2021 until 10p is [phone] 03/27/2021 9:52 PM  **Pharmacist phone directory can be found on [email or web address] listed under [facility]**

## 2021-03-27 NOTE — Progress Notes (Signed)
ANTICOAGULATION CONSULT NOTE - Initial Consult  Pharmacy Consult for heparin dosing Indication:  Catheter thrombosis extending into vasculature  s/p thrombectomy on 8/19  No Known Allergies  Patient Measurements: Height: 5\' 9"  (175.3 cm) Weight: 96.8 kg (213 lb 6.5 oz) IBW/kg (Calculated) : 70.7 Heparin Dosing Weight: 88.7  Vital Signs: Temp: 96.4 F (35.8 C) (08/19 1030) Temp Source: Bladder (08/19 0500) BP: 123/71 (08/19 1030) Pulse Rate: 65 (08/19 1030)  Labs: Recent Labs    03/25/21 0229 03/25/21 1648 03/16/2021 0234 03/20/2021 0626 03/14/2021 1000 03/31/2021 1942 03/27/21 0439 03/27/21 0700 03/27/21 0958  HGB 14.2   < > 11.2* 10.9*  --   --  9.7*  --  9.9*  HCT 42.5   < > 32.1* 32.0*  --   --  27.0*  --  29.0*  PLT 106*  --  55*  --  PLATELET CLUMPS NOTED ON SMEAR, UNABLE TO ESTIMATE  --  44*  --   --   APTT  --   --   --   --  38*  --   --   --   --   LABPROT  --   --   --   --  19.2*  --  21.0*  --   --   INR  --   --   --   --  1.6*  --  1.8*  --   --   HEPARINUNFRC  --   --   --   --   --  0.22* 0.27*  --   --   CREATININE 2.34*  --  2.91*  --   --   --   --  2.63*  --    < > = values in this interval not displayed.     Estimated Creatinine Clearance: 35.1 mL/min (A) (by C-G formula based on SCr of 2.63 mg/dL (H)).   Medical History: Past Medical History:  Diagnosis Date   Cirrhosis of liver (HCC) 04/2018    Medications:  Scheduled:   artificial tears  1 application Both Eyes Q8H   Chlorhexidine Gluconate Cloth  6 each Topical Q0600   dexamethasone (DECADRON) injection  6 mg Intravenous Q24H   docusate  100 mg Per Tube BID   feeding supplement (PROSource TF)  90 mL Per Tube TID   insulin aspart  0-15 Units Subcutaneous Q4H   insulin aspart  4 Units Subcutaneous Q6H   insulin detemir  5 Units Subcutaneous BID   lactulose  20 g Per Tube TID   mouth rinse  15 mL Mouth Rinse 10 times per day   midodrine  10 mg Per Tube Q8H   mometasone-formoterol  2 puff  Inhalation BID   octreotide  100 mcg Subcutaneous Q8H   pantoprazole (PROTONIX) IV  40 mg Intravenous QHS   polyethylene glycol  17 g Per Tube Daily   sennosides  5 mL Per Tube Daily   sodium chloride flush  10-40 mL Intracatheter Q12H   vancomycin variable dose per unstable renal function (pharmacist dosing)   Does not apply See admin instructions    Assessment: Patient is s/p thrombectomy on 8/18. Patient is currently in septic shock complicated by hepatic and acute kidney injury. Patient has low platelet count of 44 and indication for systemic anticoagulation. Discussed with MD and, in the setting of recent thrombosis,septic shock, and COVID-19, the new heparin level goal will be 0.1-0.25 while platelets remain <50. If platelets drop <25, IV heparin will be held.  Previous heparin level was 0.27 at 1500 units/hr. Will reduce dose now and continue to watch for signs and symptoms of bleeding.  Goal of Therapy:  Heparin level 0.3-0.7 Monitor platelets by anticoagulation protocol: Yes   Plan:  Reduce heparin to 1400 units/hr Will check heparin level in 8 hours Will check CBC in 8 hours Monitor CBC, H/H, and signs and symptoms of bleeding  Thank you for allowing pharmacy to participate in this patient's care.  Enos Fling, PharmD PGY1 Pharmacy Resident 03/27/2021 12:03 PM Check [email or web address] for unit specific pharmacy number

## 2021-03-27 NOTE — Progress Notes (Signed)
ANTICOAGULATION CONSULT NOTE   Pharmacy Consult for heparin dosing Indication: Radial artery occlusion   No Known Allergies  Patient Measurements: Height: 5\' 9"  (175.3 cm) Weight: 96.8 kg (213 lb 6.5 oz) IBW/kg (Calculated) : 70.7 Heparin Dosing Weight: 88.7  Vital Signs: Temp: 97.3 F (36.3 C) (08/19 0500) Temp Source: Bladder (08/19 0500) BP: 114/60 (08/19 0500) Pulse Rate: 123 (08/19 0500)  Labs: Recent Labs    03/15/2021 0757 03/11/2021 0944 03/17/2021 1236 03/19/2021 2149 03/25/21 0229 03/25/21 1648 04/02/2021 0234 03/14/2021 0626 04/03/2021 1000 03/21/2021 1942 03/27/21 0439  HGB 14.6  --  14.1   < > 14.2   < > 11.2* 10.9*  --   --  9.7*  HCT 42.9  --  39.7   < > 42.5   < > 32.1* 32.0*  --   --  27.0*  PLT 72*  --  97*  --  106*  --  55*  --  PLATELET CLUMPS NOTED ON SMEAR, UNABLE TO ESTIMATE  --  44*  APTT 33  --   --   --   --   --   --   --  38*  --   --   LABPROT 16.5*  --   --   --   --   --   --   --  19.2*  --  21.0*  INR 1.3*  --   --   --   --   --   --   --  1.6*  --  1.8*  HEPARINUNFRC  --   --   --   --   --   --   --   --   --  0.22* 0.27*  CREATININE 2.41*  --  1.79*  --  2.34*  --  2.91*  --   --   --   --   TROPONINIHS 7 6  --   --   --   --   --   --   --   --   --    < > = values in this interval not displayed.     Estimated Creatinine Clearance: 31.7 mL/min (A) (by C-G formula based on SCr of 2.91 mg/dL (H)).   Medical History: Past Medical History:  Diagnosis Date   Cirrhosis of liver (HCC) 04/2018     Assessment: 60 yo M on heparin for radial artery occlusion related to arterial line now s/p removal and embolectomy 8/18. No anticoagulation prior to admission.   Heparin level remains subtherapeutic (0.27) on gtt at 1600 units/hr. Noted low platelets likely due to alcoholic cirrhosis, sepsis, dilution, and active clot - down to 44 today. Hgb down to 9.7. No issues with line or bleeding reported per RN.  Goal of Therapy:  Heparin level 0.3-0.7  units/hr Monitor platelets by anticoagulation protocol: Yes   Plan: Increase heparin slightly to 1700 units/hr F/u 8 hr heparin level  9/18, PharmD, BCPS Please see amion for complete clinical pharmacist phone list 03/27/2021 5:41 AM

## 2021-03-27 NOTE — Progress Notes (Addendum)
Pharmacy Antibiotic Note  Edwin West is a 60 y.o. male admitted on 11-Apr-2021 with septic shock secondary to  COVID 19 pneumonia, Pseudomonas pneumonia, and Streptococcus pneumoniae bacteremia . The patient is currently requiring norepinephrine in addition to vasopressin. Pharmacy has been consulted for Meropenem and Vancomycin dosing in the setting of reduced renal function. The patient's current creatinine clearance is 35.1 mL/min with low urine output. Empiric vancomycin and meropenem are to be continued per MD, and will consider deescalating based on current susceptibilities tomorrow based on clinical improvement. COVID-19 is contributing to septic shock and respiratory failure.  Assessment: Lactate down to 2.5, with small reduction in pressor requirements on meropenem and vancomycin. Vancomycin random 20 hours after 1750 mg dose was 16. Patient will be re-dosed and another vancomycin random will be checked 8/20. Will consider scheduled vancomycin regimen pending renal function and length of therapy.  Plan: Continue meropenem 1 gram every 12 hours. Give vancomycin 750 mg x 1 and check vancomycin random 8/20  Height: 5\' 9"  (175.3 cm) Weight: 96.8 kg (213 lb 6.5 oz) IBW/kg (Calculated) : 70.7  Temp (24hrs), Avg:97.5 F (36.4 C), Min:96.3 F (35.7 C), Max:99 F (37.2 C)  Recent Labs  Lab 04/11/21 0757 04-11-2021 0944 04-11-2021 1236 03/25/21 0229 03/25/21 1031 03/25/21 2205 03/31/2021 0234 04/01/2021 0806 03/23/2021 1115 03/27/21 0439 03/27/21 0700 03/27/21 1000  WBC 0.9*  --  1.1* 6.2  --   --  17.8*  --   --  15.3*  --   --   CREATININE 2.41*  --  1.79* 2.34*  --   --  2.91*  --   --   --  2.63*  --   LATICACIDVEN 6.5* 5.9*  --   --  5.2* 5.5*  --   --   --  2.5*  --   --   VANCORANDOM  --   --   --   --   --   --   --    < > 9  --   --  16   < > = values in this interval not displayed.     Estimated Creatinine Clearance: 35.1 mL/min (A) (by C-G formula based on SCr of 2.63  mg/dL (H)).    No Known Allergies  Antimicrobials this admission: Vancomycin 8/16 >> 8/17, 8/18>> Meropenem 8/16 >> 8/16, 8/18>> Azithromycin x 1 on 8/16 Ceftriaxone 8/16 >>8/17 Cefepime 8/17>>8/18   Microbiology results: 8/16 BCx: S. pneumoniae 8/16 Sputum: S. pneumoniae, P. aeruginosa   8/17 MRSA PCR: negative  Thank you for allowing pharmacy to participate in this patient's care.  9/17, PharmD PGY1 Pharmacy Resident 03/27/2021 12:46 PM Check [email or web address] for unit specific pharmacy number

## 2021-03-27 NOTE — Progress Notes (Signed)
ABG results reported to RN and to Dr Everardo All.  Vent settings changes ordered.  Per MD, hold off on proning at this time.

## 2021-03-27 NOTE — Progress Notes (Signed)
NAME:  Edwin West, MRN:  [id number], DOB:  1961-01-04, LOS: 3 ADMISSION DATE:  2021-04-10, CONSULTATION DATE:  10-Apr-2021 REFERRING MD:  Criss Alvine, CHIEF COMPLAINT:  Respiratory Failure/ Shock 2/2 Covid 67   Brief Hx  60 year old male who presented 8/16 with SOB.  He tested positive for COVID at home.  On presentation was profoundly hypoxemic with saturations in the 80's, hypotensive, & in [facility].  He is unvaccinated. CXR revealed extensive confluent opacity throughout the right mid lung and patchy opacities on left.  Labs notable for AKI, lactic acidosis, neutropenia and thrombocytopenia.  Further work up notable for positive urine strep antigen.   Pertinent  Medical History  Cirrhosis - no recent drinking   Significant Hospital Events: Including procedures, antibiotic start and stop dates in addition to other pertinent events   8/16 Admit with R PNA, COVID +. Urine strep + Pf 87 8/17 Peak 26/ Pplat 21, propofol stopped with elevated triglycerides. BC+ for strep pneumoniae 8/18 proned x 16 hours and remain supine. S/p right radial A thrombectomy  Interim History / Subjective:  Remains on max vent settings. Proned yesterday with unchanged P:F. Remains on high dose pressors though improved in last 24 hours  Objective   Blood pressure 121/70, pulse 64, temperature (!) 96.4 F (35.8 C), resp. rate (!) 34, height 5\' 9"  (1.753 m), weight 96.8 kg, SpO2 96 %.    Vent Mode: PRVC FiO2 (%):  [100 %] 100 % Set Rate:  [34 bmp] 34 bmp Vt Set:  [520 mL] 520 mL PEEP:  [10 cmH20] 10 cmH20 Plateau Pressure:  [23 cmH20-24 cmH20] 24 cmH20   Intake/Output Summary (Last 24 hours) at 03/27/2021 0941 Last data filed at 03/27/2021 0748 Gross per 24 hour  Intake 2891.86 ml  Output 1155 ml  Net 1736.86 ml   Filed Weights   03/25/21 0212 03/31/2021 0453 03/27/21 0457  Weight: 90.5 kg 95.5 kg 96.8 kg    Physical Exam: General: Critically ill-appearing, deeply sedated HENT: [place], AT, ETT in  place Eyes: EOMI, no scleral icterus Respiratory: Bilateral rhonchi, no wheezing Cardiovascular: RRR, -M/R/G, no JVD GI: BS+, soft, nontender Extremities: Anasarca ,-tenderness Neuro: Deep sedation  Improving leukocytosis Worsening thrombocytopenia 44K Improving Cr T bili elevated but stable 5.1  Resolved Hospital Problem list   Hypothermia   Assessment & Plan:   ARDS secondary to Streptococcal/Pseudomonal PNA + COVID R radial a-line associated blood clot Strep PNA on R with associated bacteremia likely cause of initial decline, additional COVID positive.  Progressive hypoxia. Ventilator pressures more consistent with bacterial PNA. Pf 87.  Thrombocytopenia - in setting of sepsis + known prior ETOH use / cirrhosis; low 4T score Decompensated alcoholic cirrhosis Sedation Needs for Mechanical Ventilation - Failed propofol 8/17 with elevated triglycerides  -Full vent support -low Vt ventilation 4-8cc/kg -goal plateau pressure <30, driving pressure 9/17 cm <61 -target PaO2 55-65, titrate PEEP/FiO2 per ARDS protocol  -P/F ratio <150, prone therapy for 16 hours per day -Increase PEEP to 12. Repeat ABG -VAP prevention measures  -decadron/remdesivir -continue versed, fentanyl infusions with PRN versed   -PRN paralytics for ventilator synchrony  -RASS Goal -2 to -3 if no paralytics, -4 to -5 with PRN use   Septic Shock secondary to Streptococcal/Pseudomonal PNA with Bacteremia- still on pressors; partially related to sedation; echo okay Neutropenia resolved Lactic Acidosis  AKI - Renal P5K within normal limits; probably just septic ATN but should treat empirically for HRS - Trend BMP / urinary output, keep foley - Replace  electrolytes as indicated - Avoid nephrotoxic agents, ensure adequate renal perfusion - Octreotide/albumin/midodrine - Continue vanc/meropenem. De-escalate antibiotics with clinical improvement  Atrial Fibrillation  Resolved, NSR 8/17.  Likely in the setting of  sepsis  -tele monitoring   AKI secondary to ATN -Monitor UOP/Cr -Avoid nephrotoxic agents  Hepatorenal syndrome Cirrhosis 2/2 ?EtOH Increase lactulose Albumin  Possible Ileus- improved on KUB, advance postpyloric TF to goal  Right radial thrombus provoked 2/2 line in setting of COVID-19 -Hold on full anticoagulation due to thrombocytopenia -Start low-dose heparin  Best Practice (right click and "Reselect all SmartList Selections" daily)  Diet/type: TF DVT prophylaxis: start heparin drip GI prophylaxis: PPI Lines: Central line + aline Foley:  yes, still needed  Code Status:  full code Last date of multidisciplinary goals of care discussion:  Daughter updated at bedside daily  The patient is critically ill with multiple organ systems failure and requires high complexity decision making for assessment and support, frequent evaluation and titration of therapies, application of advanced monitoring technologies and extensive interpretation of multiple databases.  Independent Critical Care Time: 60 Minutes.   Mechele Collin, M.D. [facility] Pulmonary/Critical Care Medicine 03/27/2021 9:41 AM   Please see Amion for pager number to reach on-call Pulmonary and Critical Care Team.

## 2021-03-27 NOTE — Progress Notes (Signed)
Inpatient Diabetes Program Recommendations  AACE/ADA: New Consensus Statement on Inpatient Glycemic Control (2015)  Target Ranges:  Prepandial:   less than 140 mg/dL      Peak postprandial:   less than 180 mg/dL (1-2 hours)      Critically ill patients:  140 - 180 mg/dL   Lab Results  Component Value Date   GLUCAP 296 (H) 03/27/2021   HGBA1C 5.3 03/27/2021    Review of Glycemic Control Results for Edwin West, Edwin West (MRN [id number]) as of 03/27/2021 08:56  Ref. Range 03/15/2021 07:34 03/25/2021 11:10 03/19/2021 16:12 04/03/2021 19:37 03/28/2021 23:16 03/27/2021 04:44 03/27/2021 04:46 03/27/2021 07:38  Glucose-Capillary Latest Ref Range: 70 - 99 mg/dL 829 (H) 562 (H) 130 (H) 248 (H) 266 (H) 327 (H) 275 (H) 296 (H)   Inpatient Diabetes Program Recommendations:   CBGs consistently >180. -Add Novolog 4 units tube feed coverage q 4 hrs. (Hold if tube feed held or stopped for any reason) Secure chat sent to Dr. Katrinka Blazing.  Thank you, Billy Fischer. Shakeela Rabadan, RN, MSN, CDE  Diabetes Coordinator Inpatient Glycemic Control Team Team Pager [phone] (8am-5pm) 03/27/2021 8:58 AM

## 2021-03-27 NOTE — Progress Notes (Signed)
ABG results reported to RN and MD. PER Dr Everardo All, hold off on proning at this time.  RN aware.

## 2021-03-27 NOTE — Progress Notes (Signed)
PEEP increased to 12 per Dr Adair Patter, s/p ABG results.

## 2021-03-27 NOTE — Progress Notes (Signed)
  Progress Note    03/27/2021 8:Edwin AM 1 Day Post-Op  Subjective: Continues to be intubated sedated  Vitals:   03/27/21 0700 03/27/21 0805  BP: 114/61 (!) 113/56  Pulse: (!) 161 79  Resp: (!) 34 (!) 34  Temp: (!) 97.2 F (36.2 C)   SpO2: 96% 99%    Physical Exam: Intubated/sedated Right arm incision with dressing clean dry intact Palpable right snuffbox pulse Strong signal right palmar arch Right thumb does have some dark discoloration with improved capillary refill, index finger with brisk capillary refill Sensorimotor exam could not be examined due to patient's sedation.  CBC    Component Value Date/Time   WBC 15.3 (H) 03/27/2021 0439   RBC 2.58 (L) 03/27/2021 0439   HGB 9.7 (L) 03/27/2021 0439   HGB 11.5 (L) 04/09/2020 1614   HCT 27.0 (L) 03/27/2021 0439   HCT 31.5 (L) 04/09/2020 1614   PLT 44 (L) 03/27/2021 0439   PLT 94 (LL) 04/09/2020 1614   MCV 104.7 (H) 03/27/2021 0439   MCV 103 (H) 04/09/2020 1614   MCH 37.6 (H) 03/27/2021 0439   MCHC 35.9 03/27/2021 0439   RDW 15.5 03/27/2021 0439   RDW 13.3 04/09/2020 1614   LYMPHSABS 0.3 (L) 03/27/2021 0757   MONOABS 0.1 03/12/2021 0757   EOSABS 0.0 03/18/2021 0757   BASOSABS 0.0 03/21/2021 0757    BMET    Component Value Date/Time   NA 132 (L) 03/27/2021 0700   NA 138 01/07/2021 1441   K 4.0 03/27/2021 0700   CL 95 (L) 03/27/2021 0700   CO2 26 03/27/2021 0700   GLUCOSE 306 (H) 03/27/2021 0700   BUN 118 (H) 03/27/2021 0700   BUN 11 01/07/2021 1441   CREATININE 2.63 (H) 03/27/2021 0700   CALCIUM 6.8 (L) 03/27/2021 0700   GFRNONAA 27 (L) 03/27/2021 0700   GFRAA 121 09/18/2020 1621    INR    Component Value Date/Time   INR 1.8 (H) 03/27/2021 0439     Intake/Output Summary (Last 24 hours) at 03/27/2021 0825 Last data filed at 03/27/2021 0748 Gross per 24 hour  Intake 2981.03 ml  Output 1175 ml  Net 1806.03 ml     Assessment/plan:  60 y.o. West is s/p right radial artery thrombectomy.  Okay for  discontinuation of heparin from a vascular surgery standpoint in light of INR of 1.8 and known cause of radial artery thrombosis.  If there are issues over the weekend please contact vascular surgeon on-call, I will otherwise revisit the patient on Monday.   Shuayb Schepers C. Randie Heinz, MD [facility] Office: [phone] Pager: [phone]  03/27/2021 8:Edwin AM

## 2021-03-28 ENCOUNTER — Inpatient Hospital Stay (HOSPITAL_COMMUNITY): Payer: Commercial Managed Care - PPO

## 2021-03-28 DIAGNOSIS — N170 Acute kidney failure with tubular necrosis: Secondary | ICD-10-CM

## 2021-03-28 DIAGNOSIS — J9601 Acute respiratory failure with hypoxia: Secondary | ICD-10-CM | POA: Diagnosis not present

## 2021-03-28 DIAGNOSIS — U071 COVID-19: Secondary | ICD-10-CM | POA: Diagnosis not present

## 2021-03-28 DIAGNOSIS — J1282 Pneumonia due to coronavirus disease 2019: Secondary | ICD-10-CM | POA: Diagnosis not present

## 2021-03-28 LAB — POCT I-STAT 7, (LYTES, BLD GAS, ICA,H+H)
Acid-Base Excess: 3 mmol/L — ABNORMAL HIGH (ref 0.0–2.0)
Acid-Base Excess: 6 mmol/L — ABNORMAL HIGH (ref 0.0–2.0)
Acid-Base Excess: 5 mmol/L — ABNORMAL HIGH (ref 0.0–2.0)
Bicarbonate: 28.2 mmol/L — ABNORMAL HIGH (ref 20.0–28.0)
Bicarbonate: 32.2 mmol/L — ABNORMAL HIGH (ref 20.0–28.0)
Bicarbonate: 32.7 mmol/L — ABNORMAL HIGH (ref 20.0–28.0)
Calcium, Ion: 0.99 mmol/L — ABNORMAL LOW (ref 1.15–1.40)
Calcium, Ion: 1.05 mmol/L — ABNORMAL LOW (ref 1.15–1.40)
Calcium, Ion: 1.1 mmol/L — ABNORMAL LOW (ref 1.15–1.40)
HCT: 27 % — ABNORMAL LOW (ref 39.0–52.0)
HCT: 27 % — ABNORMAL LOW (ref 39.0–52.0)
HCT: 27 % — ABNORMAL LOW (ref 39.0–52.0)
Hemoglobin: 9.2 g/dL — ABNORMAL LOW (ref 13.0–17.0)
Hemoglobin: 9.2 g/dL — ABNORMAL LOW (ref 13.0–17.0)
Hemoglobin: 9.2 g/dL — ABNORMAL LOW (ref 13.0–17.0)
O2 Saturation: 90 %
O2 Saturation: 100 %
O2 Saturation: 100 %
Patient temperature: 37.3
Patient temperature: 98.4
Potassium: 4.1 mmol/L (ref 3.5–5.1)
Potassium: 3.9 mmol/L (ref 3.5–5.1)
Potassium: 4.2 mmol/L (ref 3.5–5.1)
Sodium: 139 mmol/L (ref 135–145)
Sodium: 144 mmol/L (ref 135–145)
Sodium: 145 mmol/L (ref 135–145)
TCO2: 29 mmol/L (ref 22–32)
TCO2: 34 mmol/L — ABNORMAL HIGH (ref 22–32)
TCO2: 35 mmol/L — ABNORMAL HIGH (ref 22–32)
pCO2 arterial: 44.1 mmHg (ref 32.0–48.0)
pCO2 arterial: 57.1 mmHg — ABNORMAL HIGH (ref 32.0–48.0)
pCO2 arterial: 64.4 mmHg — ABNORMAL HIGH (ref 32.0–48.0)
pH, Arterial: 7.415 (ref 7.350–7.450)
pH, Arterial: 7.359 (ref 7.350–7.450)
pH, Arterial: 7.314 — ABNORMAL LOW (ref 7.350–7.450)
pO2, Arterial: 58 mmHg — ABNORMAL LOW (ref 83.0–108.0)
pO2, Arterial: 181 mmHg — ABNORMAL HIGH (ref 83.0–108.0)
pO2, Arterial: 200 mmHg — ABNORMAL HIGH (ref 83.0–108.0)

## 2021-03-28 LAB — GLUCOSE, CAPILLARY
Glucose-Capillary: 241 mg/dL — ABNORMAL HIGH (ref 70–99)
Glucose-Capillary: 209 mg/dL — ABNORMAL HIGH (ref 70–99)
Glucose-Capillary: 210 mg/dL — ABNORMAL HIGH (ref 70–99)
Glucose-Capillary: 183 mg/dL — ABNORMAL HIGH (ref 70–99)
Glucose-Capillary: 203 mg/dL — ABNORMAL HIGH (ref 70–99)
Glucose-Capillary: 228 mg/dL — ABNORMAL HIGH (ref 70–99)

## 2021-03-28 LAB — COMPREHENSIVE METABOLIC PANEL
ALT: 38 U/L (ref 0–44)
AST: 82 U/L — ABNORMAL HIGH (ref 15–41)
Albumin: 3 g/dL — ABNORMAL LOW (ref 3.5–5.0)
Alkaline Phosphatase: 116 U/L (ref 38–126)
Anion gap: 9 (ref 5–15)
BUN: 136 mg/dL — ABNORMAL HIGH (ref 6–20)
CO2: 28 mmol/L (ref 22–32)
Calcium: 7.3 mg/dL — ABNORMAL LOW (ref 8.9–10.3)
Chloride: 102 mmol/L (ref 98–111)
Creatinine, Ser: 2.06 mg/dL — ABNORMAL HIGH (ref 0.61–1.24)
GFR, Estimated: 37 mL/min — ABNORMAL LOW (ref >60)
Glucose, Bld: 249 mg/dL — ABNORMAL HIGH (ref 70–99)
Potassium: 4.1 mmol/L (ref 3.5–5.1)
Sodium: 139 mmol/L (ref 135–145)
Total Bilirubin: 5.7 mg/dL — ABNORMAL HIGH (ref 0.3–1.2)
Total Protein: 5.2 g/dL — ABNORMAL LOW (ref 6.5–8.1)

## 2021-03-28 LAB — PROTIME-INR
INR: 1.8 — ABNORMAL HIGH (ref 0.8–1.2)
Prothrombin Time: 20.5 seconds — ABNORMAL HIGH (ref 11.4–15.2)

## 2021-03-28 LAB — HEPARIN LEVEL (UNFRACTIONATED): Heparin Unfractionated: 0.22 IU/mL — ABNORMAL LOW (ref 0.30–0.70)

## 2021-03-28 LAB — CBC
HCT: 26.5 % — ABNORMAL LOW (ref 39.0–52.0)
Hemoglobin: 9.1 g/dL — ABNORMAL LOW (ref 13.0–17.0)
MCH: 36.1 pg — ABNORMAL HIGH (ref 26.0–34.0)
MCHC: 34.3 g/dL (ref 30.0–36.0)
MCV: 105.2 fL — ABNORMAL HIGH (ref 80.0–100.0)
Platelets: 36 10*3/uL — ABNORMAL LOW (ref 150–400)
RBC: 2.52 MIL/uL — ABNORMAL LOW (ref 4.22–5.81)
RDW: 15.6 % — ABNORMAL HIGH (ref 11.5–15.5)
WBC: 14.8 10*3/uL — ABNORMAL HIGH (ref 4.0–10.5)
nRBC: 1 % — ABNORMAL HIGH (ref 0.0–0.2)

## 2021-03-28 LAB — LACTIC ACID, PLASMA: Lactic Acid, Venous: 1.7 mmol/L (ref 0.5–1.9)

## 2021-03-28 LAB — VANCOMYCIN, RANDOM: Vancomycin Rm: 13

## 2021-03-28 MED ORDER — BUDESONIDE 0.5 MG/2ML IN SUSP
0.5000 mg | Freq: Two times a day (BID) | RESPIRATORY_TRACT | Status: DC
  Administered 2021-03-28 – 2021-04-06 (×19): 0.5 mg via RESPIRATORY_TRACT
  Filled 2021-03-28 (×19): qty 2

## 2021-03-28 MED ORDER — VANCOMYCIN VARIABLE DOSE PER UNSTABLE RENAL FUNCTION (PHARMACIST DOSING): Status: DC

## 2021-03-28 MED ORDER — INSULIN DETEMIR 100 UNIT/ML ~~LOC~~ SOLN
10.0000 [IU] | Freq: Two times a day (BID) | SUBCUTANEOUS | Status: DC
  Administered 2021-03-28 – 2021-03-29 (×3): 10 [IU] via SUBCUTANEOUS
  Filled 2021-03-28 (×5): qty 0.1

## 2021-03-28 MED ORDER — ARFORMOTEROL TARTRATE 15 MCG/2ML IN NEBU
15.0000 ug | INHALATION_SOLUTION | Freq: Two times a day (BID) | RESPIRATORY_TRACT | Status: DC
  Administered 2021-03-28 – 2021-04-06 (×19): 15 ug via RESPIRATORY_TRACT
  Filled 2021-03-28 (×18): qty 2

## 2021-03-28 MED ORDER — VANCOMYCIN HCL IN DEXTROSE 1-5 GM/200ML-% IV SOLN
1000.0000 mg | Freq: Once | INTRAVENOUS | Status: AC
  Administered 2021-03-28: 1000 mg via INTRAVENOUS
  Filled 2021-03-28: qty 200

## 2021-03-28 MED ORDER — ROCURONIUM BROMIDE 10 MG/ML (PF) SYRINGE
PREFILLED_SYRINGE | INTRAVENOUS | Status: AC
  Filled 2021-03-28: qty 10

## 2021-03-28 NOTE — Progress Notes (Signed)
NAME:  Edwin West, MRN:  [id number], DOB:  Dec 21, 1960, LOS: 4 ADMISSION DATE:  April 16, 2021, CONSULTATION DATE:  16-Apr-2021 REFERRING MD:  Criss Alvine, CHIEF COMPLAINT:  Respiratory Failure/ Shock 2/2 Covid 73   Brief Hx  60 year old male who presented 8/16 with SOB.  He tested positive for COVID at home.  On presentation was profoundly hypoxemic with saturations in the 80's, hypotensive, & in [facility].  He is unvaccinated. CXR revealed extensive confluent opacity throughout the right mid lung and patchy opacities on left.  Labs notable for AKI, lactic acidosis, neutropenia and thrombocytopenia.  Further work up notable for positive urine strep antigen.   Pertinent  Medical History  Cirrhosis - no recent drinking   Significant Hospital Events: Including procedures, antibiotic start and stop dates in addition to other pertinent events   8/16 Admit with R PNA, COVID +. Urine strep + Pf 87 8/17 Peak 26/ Pplat 21, propofol stopped with elevated triglycerides. BC+ for strep pneumoniae 8/18 proned x 16 hours and remain supine. S/p right radial A thrombectomy 8/19 Initially responded to increased PEEP 8/20 Worsening hypoxemia. Proned  Interim History / Subjective:  Worsening oxygenation. PEEP increased to 14 Resuscitated with albumin. Improved pressor requirement  Objective   Blood pressure 128/64, pulse 82, temperature 98.4 F (36.9 C), resp. rate (!) 36, height 5\' 9"  (1.753 m), weight 98.6 kg, SpO2 100 %.    Vent Mode: PRVC FiO2 (%):  [100 %] 100 % Set Rate:  [35 bmp] 35 bmp Vt Set:  [560 mL] 560 mL PEEP:  [12 cmH20-14 cmH20] 14 cmH20 Plateau Pressure:  [27 cmH20-31 cmH20] 30 cmH20   Intake/Output Summary (Last 24 hours) at 03/28/2021 1325 Last data filed at 03/28/2021 1124 Gross per 24 hour  Intake 2142.92 ml  Output 2460 ml  Net -317.08 ml   Filed Weights   04/03/2021 0453 03/27/21 0457 03/28/21 0400  Weight: 95.5 kg 96.8 kg 98.6 kg   Physical Exam: General: Critically  ill-appearing, no acute distress HENT: [place], AT, ETT in place Eyes: EOMI, no scleral icterus Respiratory: Bilateral rhonchi, no wheezing Cardiovascular: RRR, -M/R/G, no JVD GI: BS+, soft, nontender Extremities: Anasarca, -tenderness Neuro: Unresponsive  ABG with worsening hypoxemia Cr improving. Good UOP Uremia worsening. K and CO2 appropriate.  Resolved Hospital Problem list   Hypothermia   Assessment & Plan:   ARDS secondary to Streptococcal/Pseudomonal PNA + COVID R radial a-line associated blood clot Strep PNA on R with associated bacteremia likely cause of initial decline, additional COVID positive.  Progressive hypoxia. Ventilator pressures more consistent with bacterial PNA. Pf 87.  Thrombocytopenia - in setting of sepsis + known prior ETOH use / cirrhosis; low 4T score Decompensated alcoholic cirrhosis Sedation Needs for Mechanical Ventilation - Failed propofol 8/17 with elevated triglycerides  -Full vent support. Goal plateau pressure <30, driving pressure 9/17 cm <35 -Target PaO2 55-65, titrate PEEP/FiO2 per ARDS protocol  -P/F ratio <150, restart proning therapy for 16 hours per day -ABG PRN -VAP prevention measures  -decadron/remdesivir -continue versed, fentanyl infusions with PRN versed   -PRN paralytics for ventilator synchrony  -RASS Goal -2 to -3 if no paralytics, -4 to -5 with PRN use   Septic Shock secondary to Streptococcal/Pseudomonal PNA with Bacteremia- improving pressor support, may be sedation related at this point Lactic Acidosis  S/p resuscitation with albumin -Wean levophed and vasopressin for MAP goal >65 -DC Vanc. Continue Meropenem. De-escalate antibiotics with clinical improvement -Continue midodrine -DC octreotide - Trend lactic acid  AKI - Renal K0X  within normal limits; probably just septic ATN but should treat empirically for HRS Improving UOP and Cr - Trend BMP / urinary output, keep foley - Replace electrolytes as indicated - Avoid  nephrotoxic agents, ensure adequate renal perfusion  Atrial Fibrillation  Resolved, NSR 8/17.  Likely in the setting of sepsis  -tele monitoring   Hepatorenal syndrome Cirrhosis 2/2 ?EtOH Continue lactulose Albumin  Acute metabolic encephalopathy secondary critical illness, sedation, uremia -Supportive care -Management as above  Possible Ileus- improved on KUB, advance postpyloric TF to goal  Right radial thrombus provoked 2/2 line in setting of COVID-19 -Hold on full anticoagulation due to thrombocytopenia -Continue low-dose heparin  Best Practice (right click and "Reselect all SmartList Selections" daily)  Diet/type: TF DVT prophylaxis: start heparin drip GI prophylaxis: PPI Lines: Central line + aline Foley:  yes, still needed  Code Status:  full code Last date of multidisciplinary goals of care discussion:  Daughter updated today  The patient is critically ill with multiple organ systems failure and requires high complexity decision making for assessment and support, frequent evaluation and titration of therapies, application of advanced monitoring technologies and extensive interpretation of multiple databases.  Independent Critical Care Time: 70 Minutes.   Mechele Collin, M.D. [facility] Pulmonary/Critical Care Medicine 03/28/2021 1:25 PM   Please see Amion for pager number to reach on-call Pulmonary and Critical Care Team.

## 2021-03-28 NOTE — Progress Notes (Signed)
Pharmacy Antibiotic Note  Edwin West is a 60 y.o. male admitted on 04/03/2021 with septic shock secondary to  COVID 19 pneumonia, Pseudomonas pneumonia, and Streptococcus pneumoniae bacteremia . The patient is currently requiring norepinephrine in addition to vasopressin. Pharmacy has been consulted for Meropenem and Vancomycin dosing in the setting of reduced renal function. The patient's current creatinine clearance is 35.1 mL/min with low urine output. Empiric vancomycin and meropenem are to be continued per MD, and will consider deescalating based on current susceptibilities tomorrow based on clinical improvement. COVID-19 is contributing to septic shock and respiratory failure.  Assessment: On meropenem and vancomycin. Vancomycin random 23 hours after 750 mg dose was 13. Patient will be re-dosed and another vancomycin random will be checked 8/21 due to changing renal function.   Plan: Continue meropenem 1 gram every 12 hours. Give vancomycin 1000 mg x 1 and check vancomycin random 8/21  Height: 5\' 9"  (175.3 cm) Weight: 98.6 kg (217 lb 6 oz) IBW/kg (Calculated) : 70.7  Temp (24hrs), Avg:99.6 F (37.6 C), Min:98.2 F (36.8 C), Max:100.9 F (38.3 C)  Recent Labs  Lab 03/19/2021 0944 03/27/2021 1236 03/25/21 0229 03/25/21 1031 03/25/21 2205 Apr 07, 2021 0234 07-Apr-2021 0806 03/27/21 0439 03/27/21 0700 03/27/21 1000 03/27/21 2030 03/28/21 0400 03/28/21 1210 03/28/21 1412  WBC  --  1.1* 6.2  --   --  17.8*  --  15.3*  --   --  16.8* 14.8*  --   --   CREATININE  --  1.79* 2.34*  --   --  2.91*  --   --  2.63*  --   --  2.06*  --   --   LATICACIDVEN 5.9*  --   --  5.2* 5.5*  --   --  2.5*  --   --   --   --   --  1.7  VANCORANDOM  --   --   --   --   --   --    < >  --   --  16  --   --  13  --    < > = values in this interval not displayed.     Estimated Creatinine Clearance: 45.3 mL/min (A) (by C-G formula based on SCr of 2.06 mg/dL (H)).    No Known  Allergies  Antimicrobials this admission: Vancomycin 8/16 >> 8/17, 8/18>> Meropenem 8/16 >> 8/16, 8/18>> Azithromycin x 1 on 8/16 Ceftriaxone 8/16 >>8/17 Cefepime 8/17>>8/18   Microbiology results: 8/16 BCx: S. pneumoniae 8/16 Sputum: S. pneumoniae, P. aeruginosa   8/17 MRSA PCR: negative  Thank you for allowing pharmacy to participate in this patient's care.  9/17, PharmD, [facility] Clinical Pharmacist Please see AMION for all Pharmacists' Contact Phone Numbers 03/28/2021, 3:20 PM

## 2021-03-28 NOTE — Progress Notes (Signed)
eLink Physician-Brief Progress Note Patient Name: Edwin West DOB: Aug 13, 1960 MRN: [id number]   Date of Service  03/28/2021  HPI/Events of Note  ABG on 100%/PRVC 35/TV 560/P 12 = 7.41/44.1/58/28.2.  eICU Interventions  Plan: Protable CXR STAT. Increase PEEP to 14, if no pneumothorax on CXR.     Intervention Category Major Interventions: Hypoxemia - evaluation and management;Respiratory failure - evaluation and management  Kynli Chou Eugene 03/28/2021, 3:20 AM

## 2021-03-28 NOTE — Progress Notes (Signed)
Vent PIP alarming.  Dr Everardo All at bedside, fio2 weaned to 90%, rate changed to 34.  Turned head to left cheek down and PIP alarm stopped, PIP reduced to 36.  RN and MD aware. Per MD, ABG in 2 hours.

## 2021-03-28 NOTE — Progress Notes (Signed)
ANTICOAGULATION CONSULT NOTE  Pharmacy Consult for heparin dosing Indication:  Catheter thrombosis extending into vasculature  s/p thrombectomy on 8/19  No Known Allergies  Labs: Recent Labs    03/27/2021 0234 03/17/2021 0626 04/08/2021 1000 03/09/2021 1942 03/27/21 0439 03/27/21 0700 03/27/21 0958 03/27/21 1822 03/27/21 2030 03/28/21 0257 03/28/21 0400  HGB 11.2*   < >  --   --  9.7*  --    < > 9.5* 9.3* 9.2*  --   HCT 32.1*   < >  --   --  27.0*  --    < > 28.0* 26.8* 27.0*  --   PLT 55*  --  PLATELET CLUMPS NOTED ON SMEAR, UNABLE TO ESTIMATE  --  44*  --   --   --  43*  --   --   APTT  --   --  38*  --   --   --   --   --   --   --   --   LABPROT  --   --  19.2*  --  21.0*  --   --   --   --   --  20.5*  INR  --   --  1.6*  --  1.8*  --   --   --   --   --  1.8*  HEPARINUNFRC  --   --   --    < > 0.27*  --   --   --  0.17*  --  0.22*  CREATININE 2.91*  --   --   --   --  2.63*  --   --   --   --   --    < > = values in this interval not displayed.   Assessment: Patient is s/p thrombectomy on 8/18. Patient is currently in septic shock complicated by hepatic and acute kidney injury. Patient has low platelet count of 44 and indication for systemic anticoagulation. Discussed with MD and, in the setting of recent thrombosis,septic shock, and COVID-19, the new heparin level goal will be 0.1-0.25 while platelets remain <50. If platelets drop <25, IV heparin will be held.   Heparin level is therapeutic at 0.22 units/ml  Goal of Therapy:  Heparin level 0.1-0.25 units/ml while platelets are <50 Monitor platelets by anticoagulation protocol: Yes   Plan:  Continue heparin drip at 1400 units/hr Daily heparin level and CBC Monitor CBC, H/H, and signs and symptoms of bleeding  Thank you for involving pharmacy in this patient's care. Talbert Cage, PharmD

## 2021-03-28 NOTE — Progress Notes (Signed)
eLink Physician-Brief Progress Note Patient Name: Edwin West DOB: 1961/04/08 MRN: [id number]   Date of Service  03/28/2021  HPI/Events of Note  ABG on 100%/PRVC 34/TV 560/P 14 = 731/64.4/200/32.7  eICU Interventions  Continue present ventilator management.      Intervention Category Major Interventions: Respiratory failure - evaluation and management;Acid-Base disturbance - evaluation and management  Minda Faas Eugene 03/28/2021, 9:01 PM

## 2021-03-29 DIAGNOSIS — U071 COVID-19: Secondary | ICD-10-CM | POA: Diagnosis not present

## 2021-03-29 DIAGNOSIS — J1282 Pneumonia due to coronavirus disease 2019: Secondary | ICD-10-CM | POA: Diagnosis not present

## 2021-03-29 DIAGNOSIS — A419 Sepsis, unspecified organism: Secondary | ICD-10-CM | POA: Diagnosis not present

## 2021-03-29 DIAGNOSIS — J9601 Acute respiratory failure with hypoxia: Secondary | ICD-10-CM | POA: Diagnosis not present

## 2021-03-29 LAB — POCT I-STAT 7, (LYTES, BLD GAS, ICA,H+H)
Acid-Base Excess: 6 mmol/L — ABNORMAL HIGH (ref 0.0–2.0)
Acid-Base Excess: 6 mmol/L — ABNORMAL HIGH (ref 0.0–2.0)
Acid-Base Excess: 7 mmol/L — ABNORMAL HIGH (ref 0.0–2.0)
Bicarbonate: 33.8 mmol/L — ABNORMAL HIGH (ref 20.0–28.0)
Bicarbonate: 32.9 mmol/L — ABNORMAL HIGH (ref 20.0–28.0)
Bicarbonate: 34 mmol/L — ABNORMAL HIGH (ref 20.0–28.0)
Calcium, Ion: 1.11 mmol/L — ABNORMAL LOW (ref 1.15–1.40)
Calcium, Ion: 1.1 mmol/L — ABNORMAL LOW (ref 1.15–1.40)
Calcium, Ion: 1.19 mmol/L (ref 1.15–1.40)
HCT: 28 % — ABNORMAL LOW (ref 39.0–52.0)
HCT: 26 % — ABNORMAL LOW (ref 39.0–52.0)
HCT: 27 % — ABNORMAL LOW (ref 39.0–52.0)
Hemoglobin: 9.5 g/dL — ABNORMAL LOW (ref 13.0–17.0)
Hemoglobin: 8.8 g/dL — ABNORMAL LOW (ref 13.0–17.0)
Hemoglobin: 9.2 g/dL — ABNORMAL LOW (ref 13.0–17.0)
O2 Saturation: 100 %
O2 Saturation: 99 %
O2 Saturation: 99 %
Patient temperature: 98.2
Patient temperature: 97
Potassium: 4.4 mmol/L (ref 3.5–5.1)
Potassium: 4.3 mmol/L (ref 3.5–5.1)
Potassium: 4.2 mmol/L (ref 3.5–5.1)
Sodium: 148 mmol/L — ABNORMAL HIGH (ref 135–145)
Sodium: 149 mmol/L — ABNORMAL HIGH (ref 135–145)
Sodium: 153 mmol/L — ABNORMAL HIGH (ref 135–145)
TCO2: 36 mmol/L — ABNORMAL HIGH (ref 22–32)
TCO2: 35 mmol/L — ABNORMAL HIGH (ref 22–32)
TCO2: 36 mmol/L — ABNORMAL HIGH (ref 22–32)
pCO2 arterial: 65.8 mmHg (ref 32.0–48.0)
pCO2 arterial: 63.6 mmHg — ABNORMAL HIGH (ref 32.0–48.0)
pCO2 arterial: 62 mmHg — ABNORMAL HIGH (ref 32.0–48.0)
pH, Arterial: 7.319 — ABNORMAL LOW (ref 7.350–7.450)
pH, Arterial: 7.32 — ABNORMAL LOW (ref 7.350–7.450)
pH, Arterial: 7.344 — ABNORMAL LOW (ref 7.350–7.450)
pO2, Arterial: 201 mmHg — ABNORMAL HIGH (ref 83.0–108.0)
pO2, Arterial: 141 mmHg — ABNORMAL HIGH (ref 83.0–108.0)
pO2, Arterial: 150 mmHg — ABNORMAL HIGH (ref 83.0–108.0)

## 2021-03-29 LAB — COMPREHENSIVE METABOLIC PANEL
ALT: 38 U/L (ref 0–44)
AST: 73 U/L — ABNORMAL HIGH (ref 15–41)
Albumin: 3.1 g/dL — ABNORMAL LOW (ref 3.5–5.0)
Alkaline Phosphatase: 130 U/L — ABNORMAL HIGH (ref 38–126)
Anion gap: 6 (ref 5–15)
BUN: 133 mg/dL — ABNORMAL HIGH (ref 6–20)
CO2: 32 mmol/L (ref 22–32)
Calcium: 8 mg/dL — ABNORMAL LOW (ref 8.9–10.3)
Chloride: 107 mmol/L (ref 98–111)
Creatinine, Ser: 1.27 mg/dL — ABNORMAL HIGH (ref 0.61–1.24)
GFR, Estimated: >60 mL/min (ref >60)
Glucose, Bld: 266 mg/dL — ABNORMAL HIGH (ref 70–99)
Potassium: 4.4 mmol/L (ref 3.5–5.1)
Sodium: 145 mmol/L (ref 135–145)
Total Bilirubin: 7.6 mg/dL — ABNORMAL HIGH (ref 0.3–1.2)
Total Protein: 5.4 g/dL — ABNORMAL LOW (ref 6.5–8.1)

## 2021-03-29 LAB — GLUCOSE, CAPILLARY
Glucose-Capillary: 177 mg/dL — ABNORMAL HIGH (ref 70–99)
Glucose-Capillary: 179 mg/dL — ABNORMAL HIGH (ref 70–99)
Glucose-Capillary: 222 mg/dL — ABNORMAL HIGH (ref 70–99)
Glucose-Capillary: 230 mg/dL — ABNORMAL HIGH (ref 70–99)
Glucose-Capillary: 236 mg/dL — ABNORMAL HIGH (ref 70–99)
Glucose-Capillary: 243 mg/dL — ABNORMAL HIGH (ref 70–99)

## 2021-03-29 LAB — CBC
HCT: 28.1 % — ABNORMAL LOW (ref 39.0–52.0)
Hemoglobin: 9.6 g/dL — ABNORMAL LOW (ref 13.0–17.0)
MCH: 35.4 pg — ABNORMAL HIGH (ref 26.0–34.0)
MCHC: 34.2 g/dL (ref 30.0–36.0)
MCV: 103.7 fL — ABNORMAL HIGH (ref 80.0–100.0)
Platelets: 34 10*3/uL — ABNORMAL LOW (ref 150–400)
RBC: 2.71 MIL/uL — ABNORMAL LOW (ref 4.22–5.81)
RDW: 15.9 % — ABNORMAL HIGH (ref 11.5–15.5)
WBC: 15.1 10*3/uL — ABNORMAL HIGH (ref 4.0–10.5)
nRBC: 0.6 % — ABNORMAL HIGH (ref 0.0–0.2)

## 2021-03-29 LAB — VANCOMYCIN, RANDOM: Vancomycin Rm: 12

## 2021-03-29 LAB — HEPARIN LEVEL (UNFRACTIONATED): Heparin Unfractionated: 0.19 IU/mL — ABNORMAL LOW (ref 0.30–0.70)

## 2021-03-29 LAB — PROTIME-INR
INR: 1.7 — ABNORMAL HIGH (ref 0.8–1.2)
Prothrombin Time: 20.3 seconds — ABNORMAL HIGH (ref 11.4–15.2)

## 2021-03-29 MED ORDER — AMIODARONE HCL IN DEXTROSE 360-4.14 MG/200ML-% IV SOLN
30.0000 mg/h | INTRAVENOUS | Status: DC
  Administered 2021-03-30 (×3): 30 mg/h via INTRAVENOUS
  Filled 2021-03-29 (×4): qty 200

## 2021-03-29 MED ORDER — MAGNESIUM SULFATE 2 GM/50ML IV SOLN
2.0000 g | Freq: Once | INTRAVENOUS | Status: AC
  Administered 2021-03-29: 2 g via INTRAVENOUS
  Filled 2021-03-29: qty 50

## 2021-03-29 MED ORDER — ALBUMIN HUMAN 25 % IV SOLN
25.0000 g | Freq: Four times a day (QID) | INTRAVENOUS | Status: AC
  Administered 2021-03-29 – 2021-03-30 (×3): 25 g via INTRAVENOUS
  Filled 2021-03-29 (×3): qty 100

## 2021-03-29 MED ORDER — VANCOMYCIN HCL 1250 MG/250ML IV SOLN
1250.0000 mg | INTRAVENOUS | Status: DC
  Filled 2021-03-29: qty 250

## 2021-03-29 MED ORDER — SODIUM CHLORIDE 0.9 % IV SOLN
1.0000 g | Freq: Three times a day (TID) | INTRAVENOUS | Status: DC
  Administered 2021-03-29: 1 g via INTRAVENOUS
  Filled 2021-03-29 (×3): qty 1

## 2021-03-29 MED ORDER — SODIUM CHLORIDE 0.9 % IV SOLN
2.0000 g | Freq: Three times a day (TID) | INTRAVENOUS | Status: DC
  Administered 2021-03-29 – 2021-04-01 (×9): 2 g via INTRAVENOUS
  Filled 2021-03-29 (×9): qty 2

## 2021-03-29 MED ORDER — AMIODARONE HCL IN DEXTROSE 360-4.14 MG/200ML-% IV SOLN
60.0000 mg/h | INTRAVENOUS | Status: AC
  Administered 2021-03-29: 60 mg/h via INTRAVENOUS

## 2021-03-29 NOTE — Progress Notes (Addendum)
Pharmacy Antibiotic Note  Edwin West is a 60 y.o. male admitted on 03/26/2021 with septic shock secondary to  COVID 19 pneumonia, Pseudomonas pneumonia, and Streptococcus pneumoniae bacteremia . The patient is currently requiring norepinephrine in addition to vasopressin. Pharmacy has been consulted for Meropenem and Vancomycin dosing in the setting of reduced renal function. The patient's current creatinine clearance is 35.1 mL/min with low urine output. Empiric vancomycin and meropenem are to be continued per MD, and will consider deescalating based on current susceptibilities tomorrow based on clinical improvement. COVID-19 is contributing to septic shock and respiratory failure.  Assessment: On meropenem and vancomycin. Vancomycin random 20 hours after 1000 mg dose was 12.  Renal function improved, will put on scheduled dosing  Vancomycin 1250 mg IV Q 24 hrs. Goal AUC 400-550. Expected AUC: 460 SCr used: 1.27  Plan: Continue meropenem 1 gram every 8 hours. Start Vancomycin 1250 mg IV q24hr Monitor renal function, clinical status and C&S. Obtain vanc levels as needed  ADDENDUM:  Narrow therapy per MD  Plan: D/c Vanc D/c Merrem Start Cefepime 2 gms IV q8hr Monitor renal function,clinical status and LOT   Height: 5\' 9"  (175.3 cm) Weight: 95.2 kg (209 lb 14.1 oz) IBW/kg (Calculated) : 70.7  Temp (24hrs), Avg:98.2 F (36.8 C), Min:97.2 F (36.2 C), Max:99 F (37.2 C)  Recent Labs  Lab 03/19/2021 0944 04/02/2021 1236 03/25/21 0229 03/25/21 1031 03/25/21 2205 04/04/2021 0234 03/22/2021 0806 03/27/21 0439 03/27/21 0700 03/27/21 1000 03/27/21 2030 03/28/21 0400 03/28/21 1210 03/28/21 1412 03/29/21 0446 03/29/21 1133  WBC  --    < > 6.2  --   --  17.8*  --  15.3*  --   --  16.8* 14.8*  --   --  15.1*  --   CREATININE  --    < > 2.34*  --   --  2.91*  --   --  2.63*  --   --  2.06*  --   --  1.27*  --   LATICACIDVEN 5.9*  --   --  5.2* 5.5*  --   --  2.5*  --   --    --   --   --  1.7  --   --   VANCORANDOM  --   --   --   --   --   --    < >  --   --    < >  --   --  13  --   --  12   < > = values in this interval not displayed.     Estimated Creatinine Clearance: 72.2 mL/min (A) (by C-G formula based on SCr of 1.27 mg/dL (H)).    No Known Allergies  Antimicrobials this admission: Vancomycin 8/16 >> 8/17, 8/18>> Meropenem 8/16 >> 8/16, 8/18>> Azithromycin x 1 on 8/16 Ceftriaxone 8/16 >>8/17 Cefepime 8/17>>8/18   Microbiology results: 8/16 BCx: S. pneumoniae 8/16 Sputum: S. pneumoniae, P. aeruginosa   8/17 MRSA PCR: negative  Thank you for allowing pharmacy to participate in this patient's care.  9/17, PharmD, [facility] Clinical Pharmacist Please see AMION for all Pharmacists' Contact Phone Numbers 03/29/2021, 12:53 PM

## 2021-03-29 NOTE — Progress Notes (Signed)
NAME:  Edwin West, MRN:  [id number], DOB:  27-Jul-1961, LOS: 5 ADMISSION DATE:  04-12-21, CONSULTATION DATE:  04/12/21 REFERRING MD:  Criss Alvine, CHIEF COMPLAINT:  Respiratory Failure/ Shock 2/2 Covid 25   Brief Hx  60 year old male who presented 8/16 with SOB.  He tested positive for COVID at home.  On presentation was profoundly hypoxemic with saturations in the 80's, hypotensive, & in [facility]  He is unvaccinated. CXR revealed extensive confluent opacity throughout the right mid lung and patchy opacities on left.  Labs notable for AKI, lactic acidosis, neutropenia and thrombocytopenia.  Further work up notable for positive urine strep antigen.   Pertinent  Medical History  Cirrhosis - no recent drinking   Significant Hospital Events: Including procedures, antibiotic start and stop dates in addition to other pertinent events   8/16 Admit with R PNA, COVID +. Urine strep + Pf 87 8/17 Peak 26/ Pplat 21, propofol stopped with elevated triglycerides. BC+ for strep pneumoniae 8/18 proned x 16 hours and remain supine. S/p right radial A thrombectomy 8/19 Initially responded to increased PEEP 8/20 Worsening hypoxemia. Proned  Interim History / Subjective:  Improved oxygenation Excellent UOP  Objective   Blood pressure 108/66, pulse 74, temperature 98.2 F (36.8 C), resp. rate (!) 34, height 5\' 9"  (1.753 m), weight 95.2 kg, SpO2 100 %.    Vent Mode: PRVC FiO2 (%):  [70 %-100 %] 70 % Set Rate:  [34 bmp-35 bmp] 34 bmp Vt Set:  [560 mL] 560 mL PEEP:  [14 cmH20] 14 cmH20 Plateau Pressure:  [24 cmH20-30 cmH20] 28 cmH20   Intake/Output Summary (Last 24 hours) at 03/29/2021 1036 Last data filed at 03/29/2021 0954 Gross per 24 hour  Intake 2854.74 ml  Output 4282 ml  Net -1427.26 ml   Filed Weights   03/27/21 0457 03/28/21 0400 03/29/21 0452  Weight: 96.8 kg 98.6 kg 95.2 kg   Physical Exam: General: Critically ill-appearing, no acute distress HENT: [place], AT, ETT in place Eyes:  EOMI, no scleral icterus Respiratory: Bilateral rhonchi.  No crackles, wheezing or rales Cardiovascular: RRR, -M/R/G, no JVD GI: BS+, soft, nontender Extremities: Anasarca, -tenderness Neuro: Deeply sedated GU: Foley in place  ABG improving hypoxemia Improved Cr Elevated uremia, stable Stable anemia Thrombocytopenia  Resolved Hospital Problem list   Hypothermia   Assessment & Plan:   ARDS secondary to Streptococcal/Pseudomonal PNA + COVID R radial a-line associated blood clot Strep PNA on R with associated bacteremia likely cause of initial decline, additional COVID positive.  Progressive hypoxia. Ventilator pressures more consistent with bacterial PNA. Pf 87.  Decompensated alcoholic cirrhosis Sedation Needs for Mechanical Ventilation - Failed propofol 8/17 with elevated triglycerides  -Full vent support. Goal plateau pressure <30, driving pressure 9/17 cm <35 -Target PaO2 55-65, titrate PEEP/FiO2 per ARDS protocol  -P/F ratio <150, continue therapy for 16 hours per day -ABG PRN -VAP prevention measures  -decadron/remdesivir -continue versed, fentanyl infusions with PRN versed   -PRN paralytics for ventilator synchrony  -RASS Goal -2 to -3 if no paralytics, -4 to -5 with PRN use   Septic Shock secondary to Streptococcal/Pseudomonal PNA with Bacteremia- improving pressor support, may be sedation related at this point Lactic Acidosis  -Albumin -Wean levophed and vasopressin for MAP goal >65 -De-escalate to Cefepime -Continue midodrine -Trend lactic acid  AKI - Renal T7D within normal limits; probably just septic ATN but should treat empirically for HRS Improving - Trend BMP / urinary output, keep foley - Replace electrolytes as indicated - Avoid nephrotoxic  agents, ensure adequate renal perfusion  Atrial Fibrillation  Resolved, NSR 8/17.  Likely in the setting of sepsis  -tele monitoring   Hepatorenal syndrome Cirrhosis 2/2 ?EtOH Continue lactulose Albumin  Acute  metabolic encephalopathy secondary critical illness, sedation, uremia -Supportive care -Management as above  Possible Ileus- improved on KUB, advance postpyloric TF to goal  Right radial thrombus provoked 2/2 line in setting of COVID-19 -Hold on anticoagulation due to thrombocytopenia  Thrombocytopenia in setting of sepsis + known prior ETOH use / cirrhosis; low 4T score -DC heparin -Transfuse for plt < 10k  Best Practice (right click and "Reselect all SmartList Selections" daily)  Diet/type: TF DVT prophylaxis: start heparin drip GI prophylaxis: PPI Lines: Central line + aline Foley:  yes, still needed  Code Status:  full code Last date of multidisciplinary goals of care discussion:  03/29/21 - Discussed with daughter    The patient is critically ill with multiple organ systems failure and requires high complexity decision making for assessment and support, frequent evaluation and titration of therapies, application of advanced monitoring technologies and extensive interpretation of multiple databases.  Independent Critical Care Time: 60 Minutes.   Mechele Collin, M.D. [facility] Pulmonary/Critical Care Medicine 03/29/2021 10:36 AM   Please see Amion for pager number to reach on-call Pulmonary and Critical Care Team.

## 2021-03-29 NOTE — Progress Notes (Signed)
ANTICOAGULATION CONSULT NOTE  Pharmacy Consult for heparin dosing Indication:  Catheter thrombosis extending into vasculature  s/p thrombectomy on 8/19  No Known Allergies  Labs: Recent Labs    04-19-21 1000 2021/04/19 1942 03/27/21 0439 03/27/21 0700 03/27/21 0958 03/27/21 2030 03/28/21 0257 03/28/21 0400 03/28/21 1331 03/28/21 1953 03/29/21 0440 03/29/21 0446 03/29/21 0500  HGB  --   --  9.7*  --    < > 9.3*   < > 9.1*   < > 9.2* 9.5* 9.6*  --   HCT  --   --  27.0*  --    < > 26.8*   < > 26.5*   < > 27.0* 28.0* 28.1*  --   PLT PLATELET CLUMPS NOTED ON SMEAR, UNABLE TO ESTIMATE  --  44*  --   --  43*  --  36*  --   --   --  34*  --   APTT 38*  --   --   --   --   --   --   --   --   --   --   --   --   LABPROT 19.2*  --  21.0*  --   --   --   --  20.5*  --   --   --  20.3*  --   INR 1.6*  --  1.8*  --   --   --   --  1.8*  --   --   --  1.7*  --   HEPARINUNFRC  --    < > 0.27*  --   --  0.17*  --  0.22*  --   --   --   --  0.19*  CREATININE  --   --   --  2.63*  --   --   --  2.06*  --   --   --  1.27*  --    < > = values in this interval not displayed.   Assessment: Patient is s/p thrombectomy on 8/18. Patient is currently in septic shock complicated by hepatic and acute kidney injury. Patient has low platelet count of 44 and indication for systemic anticoagulation. Discussed with MD and, in the setting of recent thrombosis,septic shock, and COVID-19, the new heparin level goal will be 0.1-0.25 while platelets remain <50. If platelets drop <25, IV heparin will be held.   Heparin level is therapeutic at 0.19 units/ml  Goal of Therapy:  Heparin level 0.1-0.25 units/ml while platelets are <50 Monitor platelets by anticoagulation protocol: Yes   Plan:  Continue heparin drip at 1400 units/hr Daily heparin level and CBC Monitor CBC, H/H, and signs and symptoms of bleeding  Thank you for involving pharmacy in this patient's care.  Jeanella Cara, PharmD, [facility] Clinical  Pharmacist Please see AMION for all Pharmacists' Contact Phone Numbers 03/29/2021, 7:10 AM

## 2021-03-29 NOTE — Progress Notes (Signed)
PHARMACY NOTE:  ANTIMICROBIAL RENAL DOSAGE ADJUSTMENT  Current antimicrobial regimen includes a mismatch between antimicrobial dosage and estimated renal function.  As per policy approved by the Pharmacy & Therapeutics and Medical Executive Committees, the antimicrobial dosage will be adjusted accordingly.  Current antimicrobial dosage:  Merrem 1 gm IV q12hr  Indication: Pneumonia  Renal Function:  Estimated Creatinine Clearance: 72.2 mL/min (A) (by C-G formula based on SCr of 1.27 mg/dL (H)).    Antimicrobial dosage has been changed to:  Merrem 1 gm IV q8hr  Thank you for allowing pharmacy to be a part of this patient's care.  Jeanella Cara, PharmD, [facility] Clinical Pharmacist Please see AMION for all Pharmacists' Contact Phone Numbers 03/29/2021, 7:09 AM

## 2021-03-29 NOTE — Progress Notes (Signed)
Pt placed back in supine position RT x1, RN x3. RT will cont to monitor.

## 2021-03-29 NOTE — Progress Notes (Signed)
Per discussion w/ Dr Katrinka Blazing re: ABG results and vent settings.  Per MD- wean fio2 to 50%, then attempt to wean PEEP.  I will report this to night RT.    Also, per RN, pt is in a-fib and requested that I report to MD.  This also was reported to Dr Katrinka Blazing.

## 2021-03-29 NOTE — Progress Notes (Addendum)
Noticed blood coming from mouth.  When lifting upper lip to inspect, noted upper left tooth appears chipped.  I was unsure if this is a new or old chipped tooth, and appears to have poor dentition.  I looked in mouth and did not see the missing chip, Suctioned mouth and did not obtain any.  I notified RN of this situation & sending message to MD to notify- MD is aware.

## 2021-03-30 DIAGNOSIS — J9601 Acute respiratory failure with hypoxia: Secondary | ICD-10-CM | POA: Diagnosis not present

## 2021-03-30 DIAGNOSIS — L8990 Pressure ulcer of unspecified site, unspecified stage: Secondary | ICD-10-CM | POA: Insufficient documentation

## 2021-03-30 LAB — CBC
HCT: 26.9 % — ABNORMAL LOW (ref 39.0–52.0)
Hemoglobin: 9 g/dL — ABNORMAL LOW (ref 13.0–17.0)
MCH: 36.4 pg — ABNORMAL HIGH (ref 26.0–34.0)
MCHC: 33.5 g/dL (ref 30.0–36.0)
MCV: 108.9 fL — ABNORMAL HIGH (ref 80.0–100.0)
Platelets: 31 10*3/uL — ABNORMAL LOW (ref 150–400)
RBC: 2.47 MIL/uL — ABNORMAL LOW (ref 4.22–5.81)
RDW: 16.3 % — ABNORMAL HIGH (ref 11.5–15.5)
WBC: 12.8 10*3/uL — ABNORMAL HIGH (ref 4.0–10.5)
nRBC: 0.9 % — ABNORMAL HIGH (ref 0.0–0.2)

## 2021-03-30 LAB — HEPARIN LEVEL (UNFRACTIONATED): Heparin Unfractionated: <0.1 IU/mL — ABNORMAL LOW (ref 0.30–0.70)

## 2021-03-30 LAB — POCT I-STAT 7, (LYTES, BLD GAS, ICA,H+H)
Acid-Base Excess: 5 mmol/L — ABNORMAL HIGH (ref 0.0–2.0)
Acid-Base Excess: 8 mmol/L — ABNORMAL HIGH (ref 0.0–2.0)
Bicarbonate: 31.9 mmol/L — ABNORMAL HIGH (ref 20.0–28.0)
Bicarbonate: 33.8 mmol/L — ABNORMAL HIGH (ref 20.0–28.0)
Calcium, Ion: 1.18 mmol/L (ref 1.15–1.40)
Calcium, Ion: 1.19 mmol/L (ref 1.15–1.40)
HCT: 25 % — ABNORMAL LOW (ref 39.0–52.0)
HCT: 25 % — ABNORMAL LOW (ref 39.0–52.0)
Hemoglobin: 8.5 g/dL — ABNORMAL LOW (ref 13.0–17.0)
Hemoglobin: 8.5 g/dL — ABNORMAL LOW (ref 13.0–17.0)
O2 Saturation: 89 %
O2 Saturation: 92 %
Patient temperature: 36.5
Patient temperature: 36.5
Potassium: 4.4 mmol/L (ref 3.5–5.1)
Potassium: 4.4 mmol/L (ref 3.5–5.1)
Sodium: 155 mmol/L — ABNORMAL HIGH (ref 135–145)
Sodium: 155 mmol/L — ABNORMAL HIGH (ref 135–145)
TCO2: 34 mmol/L — ABNORMAL HIGH (ref 22–32)
TCO2: 36 mmol/L — ABNORMAL HIGH (ref 22–32)
pCO2 arterial: 56.6 mmHg — ABNORMAL HIGH (ref 32.0–48.0)
pCO2 arterial: 55.8 mmHg — ABNORMAL HIGH (ref 32.0–48.0)
pH, Arterial: 7.357 (ref 7.350–7.450)
pH, Arterial: 7.388 (ref 7.350–7.450)
pO2, Arterial: 59 mmHg — ABNORMAL LOW (ref 83.0–108.0)
pO2, Arterial: 66 mmHg — ABNORMAL LOW (ref 83.0–108.0)

## 2021-03-30 LAB — COMPREHENSIVE METABOLIC PANEL
ALT: 41 U/L (ref 0–44)
AST: 66 U/L — ABNORMAL HIGH (ref 15–41)
Albumin: 3.5 g/dL (ref 3.5–5.0)
Alkaline Phosphatase: 126 U/L (ref 38–126)
Anion gap: 6 (ref 5–15)
BUN: 127 mg/dL — ABNORMAL HIGH (ref 6–20)
CO2: 32 mmol/L (ref 22–32)
Calcium: 8.3 mg/dL — ABNORMAL LOW (ref 8.9–10.3)
Chloride: 113 mmol/L — ABNORMAL HIGH (ref 98–111)
Creatinine, Ser: 1 mg/dL (ref 0.61–1.24)
GFR, Estimated: >60 mL/min (ref >60)
Glucose, Bld: 324 mg/dL — ABNORMAL HIGH (ref 70–99)
Potassium: 4.5 mmol/L (ref 3.5–5.1)
Sodium: 151 mmol/L — ABNORMAL HIGH (ref 135–145)
Total Bilirubin: 8.9 mg/dL — ABNORMAL HIGH (ref 0.3–1.2)
Total Protein: 5.8 g/dL — ABNORMAL LOW (ref 6.5–8.1)

## 2021-03-30 LAB — GLUCOSE, CAPILLARY
Glucose-Capillary: 273 mg/dL — ABNORMAL HIGH (ref 70–99)
Glucose-Capillary: 267 mg/dL — ABNORMAL HIGH (ref 70–99)
Glucose-Capillary: 255 mg/dL — ABNORMAL HIGH (ref 70–99)
Glucose-Capillary: 210 mg/dL — ABNORMAL HIGH (ref 70–99)
Glucose-Capillary: 170 mg/dL — ABNORMAL HIGH (ref 70–99)
Glucose-Capillary: 199 mg/dL — ABNORMAL HIGH (ref 70–99)

## 2021-03-30 LAB — PROTIME-INR
INR: 1.7 — ABNORMAL HIGH (ref 0.8–1.2)
Prothrombin Time: 19.9 seconds — ABNORMAL HIGH (ref 11.4–15.2)

## 2021-03-30 MED ORDER — CHLORHEXIDINE GLUCONATE 0.12% ORAL RINSE (MEDLINE KIT)
15.0000 mL | Freq: Two times a day (BID) | OROMUCOSAL | Status: DC
  Administered 2021-03-30 – 2021-04-06 (×14): 15 mL via OROMUCOSAL

## 2021-03-30 MED ORDER — FUROSEMIDE 10 MG/ML IJ SOLN
20.0000 mg | Freq: Once | INTRAMUSCULAR | Status: AC
  Administered 2021-03-30: 20 mg via INTRAVENOUS
  Filled 2021-03-30: qty 2

## 2021-03-30 MED ORDER — ORAL CARE MOUTH RINSE: 15.0000 mL | OROMUCOSAL | Status: DC

## 2021-03-30 MED ORDER — INSULIN ASPART 100 UNIT/ML IJ SOLN
4.0000 [IU] | Freq: Four times a day (QID) | INTRAMUSCULAR | Status: DC
  Administered 2021-03-30 – 2021-03-31 (×4): 4 [IU] via SUBCUTANEOUS

## 2021-03-30 MED ORDER — INSULIN DETEMIR 100 UNIT/ML ~~LOC~~ SOLN
14.0000 [IU] | Freq: Two times a day (BID) | SUBCUTANEOUS | Status: DC
  Administered 2021-03-30 – 2021-03-31 (×3): 14 [IU] via SUBCUTANEOUS
  Filled 2021-03-30 (×5): qty 0.14

## 2021-03-30 MED ORDER — FREE WATER
200.0000 mL | Freq: Four times a day (QID) | Status: DC
  Administered 2021-03-30 – 2021-04-02 (×11): 200 mL

## 2021-03-30 NOTE — Progress Notes (Addendum)
NAME:  Edwin West, MRN:  [id number], DOB:  06-Apr-1961, LOS: 6 ADMISSION DATE:  Mar 28, 2021, CONSULTATION DATE:  28-Mar-2021 REFERRING MD:  Criss Alvine, CHIEF COMPLAINT:  Respiratory Failure/ Shock 2/2 Covid 67   Brief Hx  60 year old male who presented 8/16 with severe ARDS secondary to COVID-19, streptococcal pneumonia, multiorgan failure.   Pertinent  Medical History  Cirrhosis - no recent drinking   Significant Hospital Events: Including procedures, antibiotic start and stop dates in addition to other pertinent events   8/16 Admit with R PNA, COVID +. Urine strep + Pf 87 8/17 Peak 26/ Pplat 21, propofol stopped with elevated triglycerides. BC+ for strep pneumoniae 8/18 proned x 16 hours and remain supine. S/p right radial A thrombectomy 8/19 Initially responded to increased PEEP 8/20 Worsening hypoxemia. Proned 8/21 Amio for afib, weaning pressors 8/22 Remains prone  Interim History / Subjective:   Down to PEEP of 12 and FiO2 50%. Off levophed  Objective   Blood pressure 133/67, pulse 78, temperature (!) 97.5 F (36.4 C), temperature source Bladder, resp. rate (!) 24, height 5\' 9"  (1.753 m), weight 94.6 kg, SpO2 93 %.    Vent Mode: PRVC FiO2 (%):  [50 %-70 %] 50 % Set Rate:  [34 bmp] 34 bmp Vt Set:  [560 mL] 560 mL PEEP:  [12 cmH20-14 cmH20] 12 cmH20 Plateau Pressure:  [21 cmH20-29 cmH20] 21 cmH20   Intake/Output Summary (Last 24 hours) at 03/30/2021 0855 Last data filed at 03/30/2021 0600 Gross per 24 hour  Intake 2603.68 ml  Output 2575 ml  Net 28.68 ml   Filed Weights   03/28/21 0400 03/29/21 0452 03/30/21 0454  Weight: 98.6 kg 95.2 kg 94.6 kg   Physical Exam: Blood pressure 133/67, pulse 78, temperature (!) 97.5 F (36.4 C), temperature source Bladder, resp. rate (!) 24, height 5\' 9"  (1.753 m), weight 94.6 kg, SpO2 93 %. Gen:      No acute distress critically ill-appearing HEENT:  EOMI, sclera anicteric Neck:     No masses; no thyromegaly, ETT Lungs:     Clear to auscultation bilaterally; normal respiratory effort CV:         Regular rate and rhythm; no murmurs Abd:      + bowel sounds; soft, non-tender; no palpable masses, no distension Ext:    No edema; adequate peripheral perfusion Skin:      Warm and dry; no rash Neuro: Sedated, unresponsive  Labs/imaging personally reviewed Significant for Sodium 151, creatinine 1, glucose 324 WBC 12.8, hemoglobin 9, platelets 31 next  Resolved Hospital Problem list   Hypothermia  Lactic Acidosis   Assessment & Plan:   ARDS secondary to Streptococcal/Pseudomonal PNA + COVID R radial a-line associated blood clot Strep PNA on R with associated bacteremia likely cause of initial decline, additional COVID positive.  Progressive hypoxia. Ventilator pressures more consistent with bacterial PNA. Pf 87.  Decompensated alcoholic cirrhosis Sedation Needs for Mechanical Ventilation - Failed propofol 8/17 with elevated triglycerides  Continue full vent support, low tidal volume ventilation, goal driving pressure less than 15 Recheck ABG.  Continue proning if P/F ratio remains less than 150 Continue Decadron, remdesivir Fentanyl, Versed infusions for goal RASS of -2 As needed paralytics. Lasix 20 mg x 1  Septic Shock secondary to Streptococcal/Pseudomonal PNA with Bacteremia- improving pressor support, may be sedation related at this point Off Levophed.  We will discontinue vasopressin as blood pressure looks good Monitor CVP  AKI - Renal within normal limits; probably just septic ATN but  should treat empirically for HRS Improving Monitor urine output and creatinine  Paroxysmal Atrial Fibrillation  Continue amnio, telemetry monitoring.  In normal sinus rhythm today morning  Hepatorenal syndrome Cirrhosis 2/2 ?EtOH Continue lactulose Albumin as needed  Acute metabolic encephalopathy secondary critical illness, sedation, uremia -Supportive care -Management as above  Possible Ileus proved on  KUB Tube feeds at goal  Right radial thrombus provoked 2/2 line in setting of COVID-19 -Hold on anticoagulation due to thrombocytopenia  Thrombocytopenia in setting of sepsis + known prior ETOH use / cirrhosis; low 4T score Off heparin -Transfuse for plt < 10k  Hypernatremia Continue free water  Hyperglycemia Increase Levemir to 14 bid Continue tube feed coverage  Best Practice (right click and "Reselect all SmartList Selections" daily)  Diet/type: TF DVT prophylaxis: SCDs.  Holding aspirin due to thrombocytopenia GI prophylaxis: PPI Lines: Central line Foley:  yes, still needed  Code Status:  full code Last date of multidisciplinary goals of care discussion:  03/29/21 - Discussed with daughter    The patient is critically ill with multiple organ system failure and requires high complexity decision making for assessment and support, frequent evaluation and titration of therapies, advanced monitoring, review of radiographic studies and interpretation of complex data.   Critical Care Time devoted to patient care services, exclusive of separately billable procedures, described in this note is 45 minutes.   Chilton Greathouse MD [facility] See Amion for pager  If no response to pager , please call [phone] until 7pm After 7:00 pm call Elink  [phone] 03/30/2021, 9:05 AM

## 2021-03-30 NOTE — Progress Notes (Signed)
eLink Physician-Brief Progress Note Patient Name: Edwin West DOB: 1961-03-19 MRN: [id number]   Date of Service  03/30/2021  HPI/Events of Note  pH, Arterial: 7.357 pCO2 arterial: 56.6 (H) pO2, Arterial: 59 (L) TCO2: 34 (H) Acid-Base Excess: 5.0 (H) Bicarbonate: 31.9 (H) O2 Saturation: 89.0 Patient temperature: 36.5 C  Vent: Tv560/50%/34PRVC/10PEEP ( PEEP back to 12 now) Attempt to wean PEEP.   eICU Interventions  Continue current care for now. If worsening re prone.       Intervention Category Intermediate Interventions: Diagnostic test evaluation  Ranee Gosselin 03/30/2021, 6:32 AM

## 2021-03-30 NOTE — Progress Notes (Signed)
RT attempted to wean down patients PEEP from 12 to 10. However morning ABG's showed major changes in patients PO2 bringing it down to 59 from 150. PCO2 down to 56.6. E-Link made aware. RT returned PEEP from 10 back to 12 at this time. Will continue to monitor.

## 2021-03-30 NOTE — Plan of Care (Signed)

## 2021-03-30 NOTE — Progress Notes (Signed)
    Patient continues to have palpable right snuffbox pulse.  His hand is warm with good capillary refill in his index finger.  He does have sluggish capillary refill in the thumb which is demarcating with much improved color from last week.  Neuro exam unable to be performed.  We will follow peripherally.  Tashae Inda C. Randie Heinz, MD [facility] Office: [phone] Pager: [phone]

## 2021-03-30 NOTE — Progress Notes (Signed)
Patient's ETT re-secured at 26 C with cloth tape with mepilex pads on the patient's cheeks, upper lip, and lower lip. Patient placed in prone position by RT x 2, RT students x 2, and 2 RNs.

## 2021-03-30 NOTE — Progress Notes (Signed)
Pt weaned to 10 of PEEP per MD per dayshift RT. RT will cont to monitor.

## 2021-03-30 NOTE — Progress Notes (Signed)
Pt returned to supine position with RT x1, RN x4. Sat 95% and resting comfortably. RT will cont to monitor.

## 2021-03-31 ENCOUNTER — Inpatient Hospital Stay (HOSPITAL_COMMUNITY): Payer: Commercial Managed Care - PPO

## 2021-03-31 DIAGNOSIS — J9601 Acute respiratory failure with hypoxia: Secondary | ICD-10-CM | POA: Diagnosis not present

## 2021-03-31 LAB — CBC
HCT: 27.1 % — ABNORMAL LOW (ref 39.0–52.0)
Hemoglobin: 8.8 g/dL — ABNORMAL LOW (ref 13.0–17.0)
MCH: 35.9 pg — ABNORMAL HIGH (ref 26.0–34.0)
MCHC: 32.5 g/dL (ref 30.0–36.0)
MCV: 110.6 fL — ABNORMAL HIGH (ref 80.0–100.0)
Platelets: 27 10*3/uL — CL (ref 150–400)
RBC: 2.45 MIL/uL — ABNORMAL LOW (ref 4.22–5.81)
RDW: 16.8 % — ABNORMAL HIGH (ref 11.5–15.5)
WBC: 10.8 10*3/uL — ABNORMAL HIGH (ref 4.0–10.5)
nRBC: 0.7 % — ABNORMAL HIGH (ref 0.0–0.2)

## 2021-03-31 LAB — GLUCOSE, CAPILLARY
Glucose-Capillary: 219 mg/dL — ABNORMAL HIGH (ref 70–99)
Glucose-Capillary: 210 mg/dL — ABNORMAL HIGH (ref 70–99)
Glucose-Capillary: 224 mg/dL — ABNORMAL HIGH (ref 70–99)
Glucose-Capillary: 265 mg/dL — ABNORMAL HIGH (ref 70–99)
Glucose-Capillary: 261 mg/dL — ABNORMAL HIGH (ref 70–99)
Glucose-Capillary: 220 mg/dL — ABNORMAL HIGH (ref 70–99)

## 2021-03-31 LAB — COMPREHENSIVE METABOLIC PANEL
ALT: 54 U/L — ABNORMAL HIGH (ref 0–44)
AST: 99 U/L — ABNORMAL HIGH (ref 15–41)
Albumin: 3.1 g/dL — ABNORMAL LOW (ref 3.5–5.0)
Alkaline Phosphatase: 112 U/L (ref 38–126)
Anion gap: 3 — ABNORMAL LOW (ref 5–15)
BUN: 116 mg/dL — ABNORMAL HIGH (ref 6–20)
CO2: 34 mmol/L — ABNORMAL HIGH (ref 22–32)
Calcium: 8.6 mg/dL — ABNORMAL LOW (ref 8.9–10.3)
Chloride: 120 mmol/L — ABNORMAL HIGH (ref 98–111)
Creatinine, Ser: 1 mg/dL (ref 0.61–1.24)
GFR, Estimated: >60 mL/min (ref >60)
Glucose, Bld: 229 mg/dL — ABNORMAL HIGH (ref 70–99)
Potassium: 4.7 mmol/L (ref 3.5–5.1)
Sodium: 157 mmol/L — ABNORMAL HIGH (ref 135–145)
Total Bilirubin: 9.5 mg/dL — ABNORMAL HIGH (ref 0.3–1.2)
Total Protein: 5.5 g/dL — ABNORMAL LOW (ref 6.5–8.1)

## 2021-03-31 LAB — PROTIME-INR
INR: 1.7 — ABNORMAL HIGH (ref 0.8–1.2)
Prothrombin Time: 20 seconds — ABNORMAL HIGH (ref 11.4–15.2)

## 2021-03-31 LAB — POCT I-STAT 7, (LYTES, BLD GAS, ICA,H+H)
Acid-Base Excess: 8 mmol/L — ABNORMAL HIGH (ref 0.0–2.0)
Bicarbonate: 34.1 mmol/L — ABNORMAL HIGH (ref 20.0–28.0)
Calcium, Ion: 1.26 mmol/L (ref 1.15–1.40)
HCT: 25 % — ABNORMAL LOW (ref 39.0–52.0)
Hemoglobin: 8.5 g/dL — ABNORMAL LOW (ref 13.0–17.0)
O2 Saturation: 96 %
Patient temperature: 99.7
Potassium: 4.7 mmol/L (ref 3.5–5.1)
Sodium: 163 mmol/L (ref 135–145)
TCO2: 36 mmol/L — ABNORMAL HIGH (ref 22–32)
pCO2 arterial: 55.5 mmHg — ABNORMAL HIGH (ref 32.0–48.0)
pH, Arterial: 7.4 (ref 7.350–7.450)
pO2, Arterial: 83 mmHg (ref 83.0–108.0)

## 2021-03-31 LAB — PHOSPHORUS: Phosphorus: 4.2 mg/dL (ref 2.5–4.6)

## 2021-03-31 LAB — MAGNESIUM: Magnesium: 4.5 mg/dL — ABNORMAL HIGH (ref 1.7–2.4)

## 2021-03-31 MED ORDER — INSULIN DETEMIR 100 UNIT/ML ~~LOC~~ SOLN
16.0000 [IU] | Freq: Two times a day (BID) | SUBCUTANEOUS | Status: DC
  Filled 2021-03-31: qty 0.16

## 2021-03-31 MED ORDER — DEXTROSE 5 % IV SOLN
INTRAVENOUS | Status: DC
  Filled 2021-03-31 (×2): qty 1000

## 2021-03-31 MED ORDER — INSULIN ASPART 100 UNIT/ML IJ SOLN
4.0000 [IU] | INTRAMUSCULAR | Status: DC
  Administered 2021-03-31 – 2021-04-01 (×5): 4 [IU] via SUBCUTANEOUS

## 2021-03-31 MED ORDER — FUROSEMIDE 10 MG/ML IJ SOLN
20.0000 mg | Freq: Once | INTRAMUSCULAR | Status: AC
  Administered 2021-03-31: 20 mg via INTRAVENOUS
  Filled 2021-03-31: qty 2

## 2021-03-31 MED ORDER — PANTOPRAZOLE SODIUM 40 MG PO PACK
40.0000 mg | PACK | Freq: Every day | ORAL | Status: DC
  Administered 2021-03-31 – 2021-04-05 (×6): 40 mg
  Filled 2021-03-31 (×6): qty 20

## 2021-03-31 MED ORDER — FUROSEMIDE 10 MG/ML IJ SOLN
20.0000 mg | Freq: Two times a day (BID) | INTRAMUSCULAR | Status: DC
  Administered 2021-03-31 – 2021-04-01 (×2): 20 mg via INTRAVENOUS
  Filled 2021-03-31 (×3): qty 2

## 2021-03-31 MED ORDER — INSULIN DETEMIR 100 UNIT/ML ~~LOC~~ SOLN
14.0000 [IU] | Freq: Two times a day (BID) | SUBCUTANEOUS | Status: DC
  Administered 2021-03-31 – 2021-04-02 (×4): 14 [IU] via SUBCUTANEOUS
  Filled 2021-03-31 (×5): qty 0.14

## 2021-03-31 NOTE — Progress Notes (Signed)
Platelets 27 with Dr. Marlane Mingle made aware via eICU.

## 2021-03-31 NOTE — Progress Notes (Addendum)
Pt placed supine by RT, NT and RNx3 @ 0405. SAT 99% will continue to monitor.

## 2021-03-31 NOTE — Progress Notes (Addendum)
NAME:  Edwin West, MRN:  [id number], DOB:  March 11, 1961, LOS: 7 ADMISSION DATE:  03/16/2021, CONSULTATION DATE:  03/14/2021 REFERRING MD:  Criss Alvine, CHIEF COMPLAINT:  Respiratory Failure/ Shock 2/2 Covid 78   Brief Hx  60 year old male who presented 8/16 with severe ARDS secondary to COVID-19, streptococcal pneumonia, multiorgan failure.   Pertinent  Medical History  Cirrhosis - no recent drinking   Significant Hospital Events: Including procedures, antibiotic start and stop dates in addition to other pertinent events   8/16 Admit with R PNA, COVID +. Urine strep + Pf 87 8/17 Peak 26/ Pplat 21, propofol stopped with elevated triglycerides. BC+ for strep pneumoniae 8/18 proned x 16 hours and remain supine. S/p right radial A thrombectomy 8/19 Initially responded to increased PEEP 8/20 Worsening hypoxemia. Proned 8/21 Amio for afib, weaning pressors 8/22 Remains prone 8/23 P/F ratio improved so proning held. Off pressors, diuresis started  Interim History / Subjective:   Off pressors since yesterday.  Diuresing well with low-dose Lasix ABG improved today  Objective   Blood pressure 129/62, pulse 72, temperature 99.7 F (37.6 C), temperature source Bladder, resp. rate (!) 34, height 5\' 9"  (1.753 m), weight 95 kg, SpO2 99 %. CVP:  [13 mmHg-19 mmHg] 15 mmHg  Vent Mode: PRVC FiO2 (%):  [50 %] 50 % Set Rate:  [34 bmp] 34 bmp Vt Set:  [560 mL] 560 mL PEEP:  [12 cmH20] 12 cmH20 Plateau Pressure:  [23 cmH20-26 cmH20] 23 cmH20   Intake/Output Summary (Last 24 hours) at 03/31/2021 0906 Last data filed at 03/31/2021 0600 Gross per 24 hour  Intake 1979.18 ml  Output 3085 ml  Net -1105.82 ml   Filed Weights   03/29/21 0452 03/30/21 0454 03/31/21 0417  Weight: 95.2 kg 94.6 kg 95 kg   Physical Exam: Gen:      No acute distress HEENT:  EOMI, sclera anicteric, ETT Neck:     No masses; no thyromegaly Lungs:    Clear to auscultation bilaterally; normal respiratory effort CV:          Regular rate and rhythm; no murmurs Abd:      + bowel sounds; soft, non-tender; no palpable masses, no distension Ext:    No edema; adequate peripheral perfusion Skin:      Warm and dry; no rash Neuro: Sedated, unresponsive  Labs/imaging personally reviewed Significant for Sodium increased to 163, creatinine stable at 1, platelets down to 27 Chest x-ray today reviewed with improving pulmonary infiltrates  Resolved Hospital Problem list   Hypothermia  Lactic Acidosis   Assessment & Plan:   ARDS secondary to Streptococcal/Pseudomonal PNA + COVID R radial a-line associated blood clot Strep PNA on R with associated bacteremia likely cause of initial decline, additional COVID positive.   Decompensated alcoholic cirrhosis Sedation Needs for Mechanical Ventilation - Failed propofol 8/17 with elevated triglycerides  Continue full vent support, low tidal volume ventilation, goal driving pressure less than 15 P/F ratio improved to greater than 150.  We will hold off proning for today Lighten sedation.   Fentanyl, wean Versed Versed infusions for goal RASS of -2 Finished remdesivir, Continue Decadron As needed paralytics. Lasix 20 mg bid. CVP continues to be elevated at 18  Septic Shock secondary to Streptococcal/Pseudomonal PNA with Bacteremia-  Off pressors Monitor CVP  AKI - Renal 9/17 within normal limits; probably just septic ATN but should treat empirically for HRS Improving Monitor urine output and creatinine  Paroxysmal Atrial Fibrillation  Has been in normal sinus rhythm  for 48 hours.  Discontinue amiodarone  Hepatorenal syndrome Cirrhosis 2/2 ?EtOH Continue lactulose Albumin as needed  Acute metabolic encephalopathy secondary critical illness, sedation, uremia Supportive care, lighten sedation  Possible Ileus proved on KUB Tube feeds at goal  Right radial thrombus provoked 2/2 line in setting of COVID-19 -Hold on anticoagulation due to  thrombocytopenia  Thrombocytopenia in setting of sepsis + known prior ETOH use / cirrhosis; low 4T score Off heparin Monitor labs.  Transfuse for active bleeding or platelets less than 10  Hypernatremia Continue free water Add D5W  Hyperglycemia Increase Levemir to 16 bid Continue tube feed coverage  Best Practice (right click and "Reselect all SmartList Selections" daily)  Diet/type: TF DVT prophylaxis: SCDs.  Holding aspirin due to thrombocytopenia GI prophylaxis: PPI Lines: Central line Foley:  yes, still needed  Code Status:  full code Last date of multidisciplinary goals of care discussion: Daughter updated by me 8/22 and 8/23   The patient is critically ill with multiple organ system failure and requires high complexity decision making for assessment and support, frequent evaluation and titration of therapies, advanced monitoring, review of radiographic studies and interpretation of complex data.   Critical Care Time devoted to patient care services, exclusive of separately billable procedures, described in this note is 45 minutes.   Chilton Greathouse MD [facility] See Amion for pager  If no response to pager , please call [phone] until 7pm After 7:00 pm call Elink  [phone] 03/31/2021, 9:19 AM

## 2021-03-31 NOTE — Progress Notes (Signed)
Inpatient Diabetes Program Recommendations  AACE/ADA: New Consensus Statement on Inpatient Glycemic Control (2015)  Target Ranges:  Prepandial:   less than 140 mg/dL      Peak postprandial:   less than 180 mg/dL (1-2 hours)      Critically ill patients:  140 - 180 mg/dL   Lab Results  Component Value Date   GLUCAP 224 (H) 03/31/2021   HGBA1C 5.3 April 23, 2021    Review of Glycemic Control Results for VINCEN, BEJAR (MRN [id number]) as of 03/31/2021 12:45  Ref. Range 03/31/2021 03:25 03/31/2021 07:49 03/31/2021 11:39  Glucose-Capillary Latest Ref Range: 70 - 99 mg/dL 563 (H) 875 (H) 643 (H)   Diabetes history: No history of DM Current orders for Inpatient glycemic control:  Novolog moderate q 4 hours, Novolog 4 units q 6 hours, Levemir 16 units bid.  Vital 45 ml/hr, Decadron 6 mg q 24 hours  Inpatient Diabetes Program Recommendations:    Please consider increasing Novolog to 4 units q 4 hours (for tube feed coverage).   Thanks,  Beryl Meager, RN, BC-ADM Inpatient Diabetes Coordinator Pager [phone] (8a-5p)

## 2021-04-01 ENCOUNTER — Inpatient Hospital Stay (HOSPITAL_COMMUNITY): Payer: Commercial Managed Care - PPO

## 2021-04-01 DIAGNOSIS — J9601 Acute respiratory failure with hypoxia: Secondary | ICD-10-CM | POA: Diagnosis not present

## 2021-04-01 LAB — GLUCOSE, CAPILLARY
Glucose-Capillary: 122 mg/dL — ABNORMAL HIGH (ref 70–99)
Glucose-Capillary: 165 mg/dL — ABNORMAL HIGH (ref 70–99)
Glucose-Capillary: 225 mg/dL — ABNORMAL HIGH (ref 70–99)
Glucose-Capillary: 249 mg/dL — ABNORMAL HIGH (ref 70–99)
Glucose-Capillary: 254 mg/dL — ABNORMAL HIGH (ref 70–99)
Glucose-Capillary: 320 mg/dL — ABNORMAL HIGH (ref 70–99)

## 2021-04-01 LAB — BASIC METABOLIC PANEL
Anion gap: 2 — ABNORMAL LOW (ref 5–15)
BUN: 93 mg/dL — ABNORMAL HIGH (ref 6–20)
CO2: 35 mmol/L — ABNORMAL HIGH (ref 22–32)
Calcium: 8.6 mg/dL — ABNORMAL LOW (ref 8.9–10.3)
Chloride: 126 mmol/L — ABNORMAL HIGH (ref 98–111)
Creatinine, Ser: 0.87 mg/dL (ref 0.61–1.24)
GFR, Estimated: >60 mL/min (ref >60)
Glucose, Bld: 273 mg/dL — ABNORMAL HIGH (ref 70–99)
Potassium: 5 mmol/L (ref 3.5–5.1)
Sodium: 163 mmol/L (ref 135–145)

## 2021-04-01 LAB — CBC
HCT: 28.7 % — ABNORMAL LOW (ref 39.0–52.0)
Hemoglobin: 9.3 g/dL — ABNORMAL LOW (ref 13.0–17.0)
MCH: 36.5 pg — ABNORMAL HIGH (ref 26.0–34.0)
MCHC: 32.4 g/dL (ref 30.0–36.0)
MCV: 112.5 fL — ABNORMAL HIGH (ref 80.0–100.0)
Platelets: 33 10*3/uL — ABNORMAL LOW (ref 150–400)
RBC: 2.55 MIL/uL — ABNORMAL LOW (ref 4.22–5.81)
RDW: 18.4 % — ABNORMAL HIGH (ref 11.5–15.5)
WBC: 10.1 10*3/uL (ref 4.0–10.5)
nRBC: 0.7 % — ABNORMAL HIGH (ref 0.0–0.2)

## 2021-04-01 LAB — PHOSPHORUS: Phosphorus: 4.3 mg/dL (ref 2.5–4.6)

## 2021-04-01 LAB — MAGNESIUM: Magnesium: 4.5 mg/dL — ABNORMAL HIGH (ref 1.7–2.4)

## 2021-04-01 MED ORDER — PROSOURCE TF PO LIQD
90.0000 mL | Freq: Two times a day (BID) | ORAL | Status: DC
  Administered 2021-04-01 – 2021-04-05 (×9): 90 mL
  Filled 2021-04-01 (×9): qty 90

## 2021-04-01 MED ORDER — ALBUMIN HUMAN 25 % IV SOLN
12.5000 g | Freq: Once | INTRAVENOUS | Status: AC
  Administered 2021-04-01: 12.5 g via INTRAVENOUS
  Filled 2021-04-01: qty 50

## 2021-04-01 MED ORDER — POLYETHYLENE GLYCOL 3350 17 G PO PACK: 17.0000 g | PACK | Freq: Two times a day (BID) | ORAL | Status: DC | PRN

## 2021-04-01 MED ORDER — SENNOSIDES 8.8 MG/5ML PO SYRP: 5.0000 mL | ORAL_SOLUTION | Freq: Every day | ORAL | Status: DC | PRN

## 2021-04-01 MED ORDER — VITAL 1.5 CAL PO LIQD
1000.0000 mL | ORAL | Status: DC
  Administered 2021-04-03 – 2021-04-06 (×2): 1000 mL
  Filled 2021-04-01 (×3): qty 1000

## 2021-04-01 MED ORDER — INSULIN ASPART 100 UNIT/ML IJ SOLN
0.0000 [IU] | INTRAMUSCULAR | Status: DC
  Administered 2021-04-01: 7 [IU] via SUBCUTANEOUS
  Administered 2021-04-01: 15 [IU] via SUBCUTANEOUS
  Administered 2021-04-01: 3 [IU] via SUBCUTANEOUS
  Administered 2021-04-02: 7 [IU] via SUBCUTANEOUS
  Administered 2021-04-02: 4 [IU] via SUBCUTANEOUS
  Administered 2021-04-02 (×3): 7 [IU] via SUBCUTANEOUS
  Administered 2021-04-02: 4 [IU] via SUBCUTANEOUS
  Administered 2021-04-02: 11 [IU] via SUBCUTANEOUS
  Administered 2021-04-03 (×2): 7 [IU] via SUBCUTANEOUS

## 2021-04-01 MED ORDER — MIDAZOLAM HCL 2 MG/2ML IJ SOLN
2.0000 mg | INTRAMUSCULAR | Status: DC | PRN
  Administered 2021-04-02 – 2021-04-04 (×5): 2 mg via INTRAVENOUS
  Filled 2021-04-01 (×5): qty 2

## 2021-04-01 MED ORDER — FUROSEMIDE 10 MG/ML IJ SOLN
20.0000 mg | Freq: Every day | INTRAMUSCULAR | Status: DC
Start: 2021-04-02 — End: 2021-04-03
  Administered 2021-04-02: 20 mg via INTRAVENOUS
  Filled 2021-04-01: qty 2

## 2021-04-01 MED ORDER — CEFAZOLIN SODIUM-DEXTROSE 2-4 GM/100ML-% IV SOLN
2.0000 g | Freq: Three times a day (TID) | INTRAVENOUS | Status: DC
  Administered 2021-04-01 – 2021-04-04 (×9): 2 g via INTRAVENOUS
  Filled 2021-04-01 (×10): qty 100

## 2021-04-01 MED ORDER — INSULIN ASPART 100 UNIT/ML IJ SOLN
6.0000 [IU] | INTRAMUSCULAR | Status: DC
  Administered 2021-04-02 – 2021-04-03 (×9): 6 [IU] via SUBCUTANEOUS

## 2021-04-01 MED ORDER — AMIODARONE IV BOLUS ONLY 150 MG/100ML
150.0000 mg | Freq: Once | INTRAVENOUS | Status: AC
  Administered 2021-04-01: 150 mg via INTRAVENOUS
  Filled 2021-04-01: qty 100

## 2021-04-01 NOTE — Progress Notes (Signed)
NAME:  Edwin West, MRN:  [id number], DOB:  [birth year], LOS: 8 ADMISSION DATE:  03/31/2021, CONSULTATION DATE:  03/15/2021 REFERRING MD:  Criss Alvine, CHIEF COMPLAINT:  Respiratory Failure/ Shock 2/2 Covid 71   Brief Hx  60 year old male who presented 8/16 with severe ARDS secondary to COVID-19, streptococcal pneumonia, multiorgan failure.   Pertinent  Medical History  Cirrhosis - no recent drinking   Significant Hospital Events: Including procedures, antibiotic start and stop dates in addition to other pertinent events   8/16 Admit with R PNA, COVID +. Urine strep + Pf 87 8/17 Peak 26/ Pplat 21, propofol stopped with elevated triglycerides. BC+ for strep pneumoniae 8/18 proned x 16 hours and remain supine. S/p right radial A thrombectomy 8/19 Initially responded to increased PEEP 8/20 Worsening hypoxemia. Proned 8/21 Amio for afib, weaning pressors 8/22 Remains prone 8/23 P/F ratio improved so proning held. Off pressors, diuresis started  Interim History / Subjective:   Diuresing well with Lasix, sodium remains elevated Restarted amnio as he went back into atrial fibrillation  Objective   Blood pressure 128/61, pulse 65, temperature (!) 97.2 F (36.2 C), temperature source Bladder, resp. rate (!) 34, height 5\' 9"  (1.753 m), weight 95 kg, SpO2 100 %. CVP:  [15 mmHg-20 mmHg] 17 mmHg  Vent Mode: PRVC FiO2 (%):  [50 %] 50 % Set Rate:  [34 bmp-37 bmp] 37 bmp Vt Set:  [560 mL] 560 mL PEEP:  [12 cmH20] 12 cmH20 Plateau Pressure:  [24 cmH20-26 cmH20] 24 cmH20   Intake/Output Summary (Last 24 hours) at 04/01/2021 1048 Last data filed at 04/01/2021 04/03/2021 Gross per 24 hour  Intake 3752.11 ml  Output 8777 ml  Net -5024.89 ml   Filed Weights   03/29/21 0452 03/30/21 0454 03/31/21 0417  Weight: 95.2 kg 94.6 kg 95 kg   Physical Exam: Gen:      No acute distress HEENT:  EOMI, jaundiced Neck:     No masses; no thyromegaly, ETT Lungs:    Clear to auscultation bilaterally; normal  respiratory effort CV:         Regular rate and rhythm; no murmurs Abd:      + bowel sounds; soft, non-tender; no palpable masses, no distension Ext:    No edema; adequate peripheral perfusion Skin:      Warm and dry; no rash Neuro: Sedated  Labs/imaging personally reviewed Significant for Sodium remains elevated at 163, glucose was 23, creatinine 0.87 No new imaging  Resolved Hospital Problem list   Hypothermia  Lactic Acidosis  Septic shock  Assessment & Plan:   ARDS secondary to Streptococcal/Pseudomonal PNA + COVID R radial a-line associated blood clot Strep PNA on R with associated bacteremia likely cause of initial decline, additional COVID positive.   Decompensated alcoholic cirrhosis Sedation Needs for Mechanical Ventilation - Failed propofol 8/17 with elevated triglycerides  Continue full vent support, low tidal volume ventilation, goal driving pressure less than 15 P/F ratio improved to greater than 150.  Off proning protocol Lighten sedation.   Fentanyl, wean Versed Versed infusions for goal RASS of -2 Finished remdesivir, Continue Decadron As needed paralytics. Reduce lasix dose to 20 mg daily as he has diuresed 6 lt over 2 days  Severe sepsis secondary to Streptococcal/Pseudomonal PNA with Bacteremia-  Off pressors Monitor CVP Can narrow antibiotics to cefazolin to complete 14 days of therapy  AKI - Renal 9/17 within normal limits; probably just septic ATN but should treat empirically for HRS Improving Monitor urine output and creatinine  Paroxysmal Atrial Fibrillation  Back in a fin now Continue amiodarone  Hepatorenal syndrome Decompensated Cirrhosis 2/2 ?EtOH Continue lactulose Albumin as needed Elevated bilirubin noted. Order RUQ [place]  Acute metabolic encephalopathy secondary critical illness, sedation, uremia Supportive care, lighten sedation  Possible Ileus proved on KUB Tube feeds at goal  Right radial thrombus provoked 2/2 line in setting of  COVID-19 Hold on anticoagulation due to thrombocytopenia  Thrombocytopenia in setting of sepsis + known prior ETOH use / cirrhosis; low 4T score Off heparin Monitor labs.  Transfuse for active bleeding or platelets less than 10  Hypernatremia Continue free water D5W ordered yesterday but he has been getting D5 NS. This has been corrected today AM Follow BMP  Hyperglycemia Continue levemir Increase tube feed coverage to 4 units q4 Continue tube feed coverage  Best Practice (right click and "Reselect all SmartList Selections" daily)  Diet/type: TF DVT prophylaxis: SCDs.  Holding aspirin due to thrombocytopenia GI prophylaxis: PPI Lines: Central line Foley:  yes, still needed  Code Status:  full code Last date of multidisciplinary goals of care discussion: Daughter daily updated by me daily   The patient is critically ill with multiple organ system failure and requires high complexity decision making for assessment and support, frequent evaluation and titration of therapies, advanced monitoring, review of radiographic studies and interpretation of complex data.   Critical Care Time devoted to patient care services, exclusive of separately billable procedures, described in this note is 45 minutes.   Chilton Greathouse MD [facility] See Amion for pager  If no response to pager , please call [phone] until 7pm After 7:00 pm call Elink  [phone] 04/01/2021, 10:48 AM

## 2021-04-01 NOTE — Progress Notes (Signed)
eLink Physician-Brief Progress Note Patient Name: Edwin West DOB: 1960-10-10 MRN: [id number]   Date of Service  04/01/2021  HPI/Events of Note  Serum Sodium 163, unchanged from yesterday morning.  eICU Interventions  Hold AM dose of Lasix pending review by day time PCCM attending physician, Albumin 25 % 12.5 gm iv x 1 ordered.        [name] 04/01/2021, 5:59 AM

## 2021-04-01 NOTE — Progress Notes (Signed)
Inpatient Diabetes Program Recommendations  AACE/ADA: New Consensus Statement on Inpatient Glycemic Control (2015)  Target Ranges:  Prepandial:   less than 140 mg/dL      Peak postprandial:   less than 180 mg/dL (1-2 hours)      Critically ill patients:  140 - 180 mg/dL   Lab Results  Component Value Date   GLUCAP 249 (H) 04/01/2021   HGBA1C 5.3 03/24/2021    Review of Glycemic Control Results for Edwin West, Edwin West (MRN [id number]) as of 04/01/2021 11:16  Ref. Range 03/31/2021 15:42 03/31/2021 19:47 03/31/2021 23:15 04/01/2021 03:14 04/01/2021 07:30  Glucose-Capillary Latest Ref Range: 70 - 99 mg/dL 245 (H) 809 (H) 983 (H) 225 (H) 249 (H)  Diabetes history: No history of DM Current orders for Inpatient glycemic control:  Novolog moderate q 4 hours, Novolog 4 units q 6 hours, Levemir 14 units bid.  Vital 45 ml/hr, Decadron 6 mg q 24 hours  Inpatient Diabetes Program Recommendations:    Consider increasing Levemir to 18 units bid.  Also consider changing Novolog tube feed coverage to 6 units q 4 hours.   Thanks,  Beryl Meager, RN, BC-ADM Inpatient Diabetes Coordinator Pager [phone]  (8a-5p)

## 2021-04-01 NOTE — Progress Notes (Signed)
eLink Physician-Brief Progress Note Patient Name: Edwin West DOB: 05/28/1961 MRN: [id number]   Date of Service  04/01/2021  HPI/Events of Note  Amiodarone was stopped yesterday due to patient being in sinus rhythm, he's now back in Afib.  eICU Interventions  Will give an Amiodarone bolus to see if it terminates the Rhythm, otherwise will consider resuming gtt. Electrolytes are pending.        Thomasene Lot Zettie Gootee 04/01/2021, 1:51 AM

## 2021-04-01 NOTE — Progress Notes (Signed)
Pharmacy Antibiotic Note  Edwin West is a 60 y.o. male admitted on 03/18/2021 with septic shock secondary to  COVID 19 pneumonia, Pseudomonas pneumonia, and Streptococcus pneumoniae bacteremia . Pharmacy is consulted for cefazolin dosing.  Assessment: The patient's clinical condition has significantly improved. He is no longer requiring vasopressors. The patient remains intubated. White blood count and temperature are within normal limits. The patient's creatinine clearance and urine output have significantly improved. The patient has been sufficiently treated with 8 days of pseudomonas coverage and, after speaking with Dr. Isaiah Serge, cefepime will be deescalated to cefazolin to cover strep for a total duration of 14 days. Patient is hypernatremic and antibiotic agents with sodium should be avoided at this time.  Plan: Start Cefazolin 2 grams every 8 hours until 8/29 Monitor renal function, clinical status and C&S.   Height: 5\' 9"  (175.3 cm) Weight: 95 kg (209 lb 7 oz) IBW/kg (Calculated) : 70.7  Temp (24hrs), Avg:98.4 F (36.9 C), Min:97.2 F (36.2 C), Max:100.2 F (37.9 C)  Recent Labs  Lab 03/25/21 2205 04/08/2021 0234 03/27/21 0439 03/27/21 0700 03/28/21 0400 03/28/21 1210 03/28/21 1412 03/29/21 0446 03/29/21 1133 03/30/21 0453 03/31/21 0418 04/01/21 0311  WBC  --    < > 15.3*   < > 14.8*  --   --  15.1*  --  12.8* 10.8* 10.1  CREATININE  --    < >  --    < > 2.06*  --   --  1.27*  --  1.00 1.00 0.87  LATICACIDVEN 5.5*  --  2.5*  --   --   --  1.7  --   --   --   --   --   VANCORANDOM  --    < >  --    < >  --  13  --   --  12  --   --   --    < > = values in this interval not displayed.     Estimated Creatinine Clearance: 105.2 mL/min (by C-G formula based on SCr of 0.87 mg/dL).    No Known Allergies  Antimicrobials this admission: Vancomycin 8/16 >> 8/17, 8/18>>8/19 Meropenem 8/16 >> 8/16, 8/18>>8/21 Azithromycin x 1 on 8/16 Ceftriaxone 8/16  >>8/17 Cefepime 8/17>>8/18, 8/21>>8/24 Cefazolin 8/24>>8/29   Microbiology results: 8/16 BCx: S. pneumoniae 8/16 Sputum: S. pneumoniae, P. aeruginosa   8/17 MRSA PCR: negative  Thank you for allowing pharmacy to participate in this patient's care.  9/17, PharmD PGY1 Pharmacy Resident 04/01/2021 11:35 AM Check [email or web address] for unit specific pharmacy number

## 2021-04-01 NOTE — Progress Notes (Signed)
Nutrition Follow-up  DOCUMENTATION CODES:   Not applicable  INTERVENTION:   Continue tube feeds via post-pyloric Cortrak: - Increase Vital 1.5 to 60 ml/hr (1440 ml/day) - ProSource TF 90 ml BID - Free water per CCM, currently 200 ml q 6 hours  Tube feeding regimen provides 2320 kcal, 141 grams of protein, and 1100 ml of H2O.  Current free water with flushes: 1900 ml  NUTRITION DIAGNOSIS:   Increased nutrient needs related to acute illness (COVID-19 ARDS) as evidenced by estimated needs.  Ongoing, being addressed via TF  GOAL:   Patient will meet greater than or equal to 90% of their needs  Met via TF  MONITOR:   Vent status, Labs, Weight trends, TF tolerance, I & O's  REASON FOR ASSESSMENT:   Ventilator, Consult Enteral/tube feeding initiation and management  ASSESSMENT:   60 year old male who presented to the ED on 8/16 with SOB. Pt tested positive for COVID-19. PMH of cirrhosis. Pt admitted with COVID ARDS and required intubation. Pt also with decompensated alcoholic cirrhosis and AKI.  8/16 - intubated 8/17 - Cortrak placed (tip post-pyloric), TF started at trickle rate 8/18 - s/p R radial artery embolectomy 8/22 - off pressors  Discussed pt with RN and during ICU rounds. Pt with severe ARDS secondary to COVID-19, streptococcal pneumonia, multiorgan failure. Pt is off of the proning protocol per CCM. Pt tolerating current TF via post-pyloric Cortrak without issue. RD to increase tube feeds to better meet re-estimated needs.  Pt with new pressure injuries. Will plan to maximize protein via TF.  Pt with significant stool output via rectal tube; pt on scheduled colace and lactulose. Discussed bowel regimen with MD; okay to d/c scheduled colace.  Admit weight: 89.4 kg Current weight: 95 kg  Weight trending up; however, pt with +3 pitting generalized edema and +4 pitting edema to BUE and BLE.  Patient is currently intubated on ventilator support MV: 19.2  L/min Temp (24hrs), Avg:98.1 F (36.7 C), Min:97.2 F (36.2 C), Max:99.7 F (37.6 C)  Drips: Fentanyl Versed D5: 100 ml/hr (provides 408 kcal daily)  Medications reviewed and include: IV decadron, colace, IV lasix, SSI q 4 hours, novolog 4 units q 4 hours, levemir 14 units BID, lactulose, protonix, IV abx  Labs reviewed: sodium 163, BUN 93, magnesium 4.5, elevated LFTs, hemoglobin 9.3, platelets 33 CBG's: 220-265 x 24 hours  UOP: 5725 ml x 24 hours Stool: 4552 ml x 24 hours via rectal tube I/O's: +3.9 L since admit  Diet Order:   Diet Order             Diet NPO time specified  Diet effective now                   EDUCATION NEEDS:   No education needs have been identified at this time  Skin:  Skin Assessment: Skin Integrity Issues: DTI: coccyx Stage I: R heel Incisions: R arm  Last BM:  04/01/21 multiple type 7 via rectal tube  Height:   Ht Readings from Last 1 Encounters:  03/31/21 '5\' 9"'  (1.753 m)    Weight:   Wt Readings from Last 1 Encounters:  03/31/21 95 kg    BMI:  Body mass index is 30.93 kg/m.  Estimated Nutritional Needs:   Kcal:  2300-2500  Protein:  125-145 grams  Fluid:  >/= 2.0 L    Gustavus Bryant, MS, RD, LDN Inpatient Clinical Dietitian Please see AMiON for contact information.

## 2021-04-01 NOTE — Progress Notes (Signed)
Date and time results received: 04/01/21 0532 (use smartphrase ".now" to insert current time)  Test: Sodium Critical Value: 163  Name of Provider Notified: Stacy at [facility] for Dr. Warrick Parisian  Orders Received? Or Actions Taken?:  No new orders received at this time

## 2021-04-02 DIAGNOSIS — J9601 Acute respiratory failure with hypoxia: Secondary | ICD-10-CM | POA: Diagnosis not present

## 2021-04-02 LAB — COMPREHENSIVE METABOLIC PANEL
ALT: 129 U/L — ABNORMAL HIGH (ref 0–44)
AST: 201 U/L — ABNORMAL HIGH (ref 15–41)
Albumin: 2.9 g/dL — ABNORMAL LOW (ref 3.5–5.0)
Alkaline Phosphatase: 142 U/L — ABNORMAL HIGH (ref 38–126)
Anion gap: 5 (ref 5–15)
BUN: 78 mg/dL — ABNORMAL HIGH (ref 6–20)
CO2: 32 mmol/L (ref 22–32)
Calcium: 8.9 mg/dL (ref 8.9–10.3)
Chloride: 126 mmol/L — ABNORMAL HIGH (ref 98–111)
Creatinine, Ser: 0.94 mg/dL (ref 0.61–1.24)
GFR, Estimated: >60 mL/min (ref >60)
Glucose, Bld: 248 mg/dL — ABNORMAL HIGH (ref 70–99)
Potassium: 5.3 mmol/L — ABNORMAL HIGH (ref 3.5–5.1)
Sodium: 163 mmol/L (ref 135–145)
Total Bilirubin: 18.8 mg/dL (ref 0.3–1.2)
Total Protein: 5.9 g/dL — ABNORMAL LOW (ref 6.5–8.1)

## 2021-04-02 LAB — CBC
HCT: 28.1 % — ABNORMAL LOW (ref 39.0–52.0)
Hemoglobin: 10.1 g/dL — ABNORMAL LOW (ref 13.0–17.0)
MCH: 41.4 pg — ABNORMAL HIGH (ref 26.0–34.0)
MCHC: 35.9 g/dL (ref 30.0–36.0)
MCV: 115.2 fL — ABNORMAL HIGH (ref 80.0–100.0)
Platelets: 40 10*3/uL — ABNORMAL LOW (ref 150–400)
RBC: 2.44 MIL/uL — ABNORMAL LOW (ref 4.22–5.81)
RDW: 22.3 % — ABNORMAL HIGH (ref 11.5–15.5)
WBC: 13.7 10*3/uL — ABNORMAL HIGH (ref 4.0–10.5)
nRBC: 0.3 % — ABNORMAL HIGH (ref 0.0–0.2)

## 2021-04-02 LAB — POCT I-STAT 7, (LYTES, BLD GAS, ICA,H+H)
Acid-Base Excess: 7 mmol/L — ABNORMAL HIGH (ref 0.0–2.0)
Bicarbonate: 32.2 mmol/L — ABNORMAL HIGH (ref 20.0–28.0)
Calcium, Ion: 1.26 mmol/L (ref 1.15–1.40)
HCT: 32 % — ABNORMAL LOW (ref 39.0–52.0)
Hemoglobin: 10.9 g/dL — ABNORMAL LOW (ref 13.0–17.0)
O2 Saturation: 95 %
Patient temperature: 99.1
Potassium: 5.1 mmol/L (ref 3.5–5.1)
Sodium: 166 mmol/L (ref 135–145)
TCO2: 34 mmol/L — ABNORMAL HIGH (ref 22–32)
pCO2 arterial: 48.7 mmHg — ABNORMAL HIGH (ref 32.0–48.0)
pH, Arterial: 7.43 (ref 7.350–7.450)
pO2, Arterial: 74 mmHg — ABNORMAL LOW (ref 83.0–108.0)

## 2021-04-02 LAB — GLUCOSE, CAPILLARY
Glucose-Capillary: 272 mg/dL — ABNORMAL HIGH (ref 70–99)
Glucose-Capillary: 197 mg/dL — ABNORMAL HIGH (ref 70–99)
Glucose-Capillary: 220 mg/dL — ABNORMAL HIGH (ref 70–99)
Glucose-Capillary: 248 mg/dL — ABNORMAL HIGH (ref 70–99)
Glucose-Capillary: 242 mg/dL — ABNORMAL HIGH (ref 70–99)
Glucose-Capillary: 230 mg/dL — ABNORMAL HIGH (ref 70–99)

## 2021-04-02 LAB — SODIUM
Sodium: 161 mmol/L (ref 135–145)
Sodium: 159 mmol/L — ABNORMAL HIGH (ref 135–145)

## 2021-04-02 MED ORDER — FREE WATER
300.0000 mL | Status: DC
  Administered 2021-04-02 – 2021-04-05 (×19): 300 mL

## 2021-04-02 MED ORDER — INSULIN DETEMIR 100 UNIT/ML ~~LOC~~ SOLN
20.0000 [IU] | Freq: Two times a day (BID) | SUBCUTANEOUS | Status: DC
  Administered 2021-04-02: 20 [IU] via SUBCUTANEOUS
  Filled 2021-04-02 (×3): qty 0.2

## 2021-04-02 NOTE — Progress Notes (Signed)
Patient at times is breathing over the vent. RR  closer to 40 thought ventilation RR set at 34. Did contact e-link and had ABG drawn to see gasses. Patient right at border, see results. Not wanting to make any vent changes at this time, appears to be working according to ABG draw. Patient currently on 1 of versed and 50 of fentanyl per MAR. Will continue to monitor and give PRN meds if patient shows less toleration of ventilation. Currently trying to wean sedation to see if patient will awaken.

## 2021-04-02 NOTE — Progress Notes (Signed)
Critical value reported to RN at this time

## 2021-04-02 NOTE — Progress Notes (Addendum)
NAME:  Edwin West, MRN:  [id number], DOB:  November 30, 1960, LOS: 9 ADMISSION DATE:  04/03/2021, CONSULTATION DATE:  04/05/2021 REFERRING MD:  Criss Alvine, CHIEF COMPLAINT:  Respiratory Failure/ Shock 2/2 Covid 11   Brief Hx  60 year old male who presented 8/16 with severe ARDS secondary to COVID-19, streptococcal pneumonia, multiorgan failure.   Pertinent  Medical History  Cirrhosis - no recent drinking   Significant Hospital Events: Including procedures, antibiotic start and stop dates in addition to other pertinent events   8/16 Admit with R PNA, COVID +. Urine strep + Pf 87 8/17 Peak 26/ Pplat 21, propofol stopped with elevated triglycerides. BC+ for strep pneumoniae 8/18 proned x 16 hours and remain supine. S/p right radial A thrombectomy 8/19 Initially responded to increased PEEP 8/20 Worsening hypoxemia. Proned 8/21 Amio for afib, weaning pressors 8/22 Remains prone 8/23 P/F ratio improved so proning held. Off pressors, diuresis started 8/24 Free water and d5W for hypernatremia  Interim History / Subjective:   Stable on the ventilator.  PEEP is weaning down  Objective   Blood pressure (!) 148/67, pulse 82, temperature 98 F (36.7 C), temperature source Axillary, resp. rate (!) 38, height 5\' 9"  (1.753 m), weight 94.6 kg, SpO2 98 %. CVP:  [16 mmHg-19 mmHg] 17 mmHg  Vent Mode: PRVC FiO2 (%):  [40 %-50 %] 40 % Set Rate:  [34 bmp] 34 bmp Vt Set:  [560 mL] 560 mL PEEP:  [8 cmH20-12 cmH20] 8 cmH20 Plateau Pressure:  [21 cmH20-27 cmH20] 21 cmH20   Intake/Output Summary (Last 24 hours) at 04/02/2021 0945 Last data filed at 04/02/2021 0900 Gross per 24 hour  Intake 3430.69 ml  Output 4550 ml  Net -1119.31 ml   Filed Weights   03/30/21 0454 03/31/21 0417 04/02/21 0500  Weight: 94.6 kg 95 kg 94.6 kg   Physical Exam: Blood pressure (!) 148/67, pulse 82, temperature 98 F (36.7 C), temperature source Axillary, resp. rate (!) 38, height 5\' 9"  (1.753 m), weight 94.6 kg, SpO2  98 %. Gen:      No acute distress, chronically ill-appearing HEENT:  EOMI, jaundiced Neck:     No masses; no thyromegaly, ETT Lungs:    Clear to auscultation bilaterally; normal respiratory effort CV:         Regular rate and rhythm; no murmurs Abd:      + bowel sounds; soft, non-tender; no palpable masses, no distension Ext:    No edema; adequate peripheral perfusion Skin:      Warm and dry; no rash Neuro: Sedated  Labs/imaging personally reviewed Sodium remains elevated at 163.  WBC count slightly elevated to 13.7  Resolved Hospital Problem list   Hypothermia  Lactic Acidosis  Septic shock  Assessment & Plan:   ARDS secondary to Streptococcal/Pseudomonal PNA + COVID R radial a-line associated blood clot Strep PNA on R with associated bacteremia likely cause of initial decline, additional COVID positive.   Decompensated alcoholic cirrhosis Sedation Needs for Mechanical Ventilation - Failed propofol 8/17 with elevated triglycerides  Continue full vent support, low tidal volume ventilation, goal driving pressure less than 15 Lighten sedation.   Fentanyl, weaning Versed to off Finished remdesivir and steroid As needed paralytics. Lasix 20 mg daily  Severe sepsis secondary to Streptococcal/Pseudomonal PNA with Bacteremia-  Off pressors Monitor CVP Narrowed antibiotics to cefazolin to complete 14 days of therapy  AKI - Renal within normal limits; probably just septic ATN but should treat empirically for HRS Improving Monitor urine output and creatinine  Paroxysmal Atrial Fibrillation  Back in a fin now Continue amiodarone  Hepatorenal syndrome Decompensated Cirrhosis 2/2 ?EtOH Lactulose held due to diarrhea Albumin as needed Right upper quadrant ultrasound reviewed  Acute metabolic encephalopathy secondary critical illness, sedation, uremia Supportive care, lighten sedation  Possible Ileus proved on KUB Tube feeds at goal  Right radial thrombus provoked 2/2  line in setting of COVID-19 Hold on anticoagulation due to thrombocytopenia  Thrombocytopenia in setting of sepsis + known prior ETOH use / cirrhosis; low 4T score Off heparin Monitor labs.  Transfuse for active bleeding or platelets less than 10  Hypernatremia Increase free water, increase D5W to 150 an hour  Hyperglycemia Levemir, NovoLog tube feed coverage  Best Practice (right click and "Reselect all SmartList Selections" daily)  Diet/type: TF DVT prophylaxis: SCDs.  Holding aspirin due to thrombocytopenia GI prophylaxis: PPI Lines: Central line Foley:  yes, still needed  Code Status:  full code Last date of multidisciplinary goals of care discussion: Daughter daily updated by me daily. No answer on 8/25   The patient is critically ill with multiple organ system failure and requires high complexity decision making for assessment and support, frequent evaluation and titration of therapies, advanced monitoring, review of radiographic studies and interpretation of complex data.   Critical Care Time devoted to patient care services, exclusive of separately billable procedures, described in this note is 45 minutes.   Chilton Greathouse MD [facility] See Amion for pager  If no response to pager , please call [phone] until 7pm After 7:00 pm call Elink  [phone] 04/02/2021, 9:45 AM

## 2021-04-03 ENCOUNTER — Other Ambulatory Visit (HOSPITAL_COMMUNITY): Payer: Commercial Managed Care - PPO

## 2021-04-03 ENCOUNTER — Inpatient Hospital Stay (HOSPITAL_COMMUNITY): Payer: Commercial Managed Care - PPO

## 2021-04-03 DIAGNOSIS — J9601 Acute respiratory failure with hypoxia: Secondary | ICD-10-CM | POA: Diagnosis not present

## 2021-04-03 DIAGNOSIS — K7031 Alcoholic cirrhosis of liver with ascites: Secondary | ICD-10-CM

## 2021-04-03 LAB — POCT I-STAT 7, (LYTES, BLD GAS, ICA,H+H)
Acid-Base Excess: 7 mmol/L — ABNORMAL HIGH (ref 0.0–2.0)
Bicarbonate: 32.4 mmol/L — ABNORMAL HIGH (ref 20.0–28.0)
Calcium, Ion: 1.22 mmol/L (ref 1.15–1.40)
HCT: 31 % — ABNORMAL LOW (ref 39.0–52.0)
Hemoglobin: 10.5 g/dL — ABNORMAL LOW (ref 13.0–17.0)
O2 Saturation: 87 %
Patient temperature: 100
Potassium: 6.2 mmol/L — ABNORMAL HIGH (ref 3.5–5.1)
Sodium: 159 mmol/L — ABNORMAL HIGH (ref 135–145)
TCO2: 34 mmol/L — ABNORMAL HIGH (ref 22–32)
pCO2 arterial: 50.8 mmHg — ABNORMAL HIGH (ref 32.0–48.0)
pH, Arterial: 7.415 (ref 7.350–7.450)
pO2, Arterial: 55 mmHg — ABNORMAL LOW (ref 83.0–108.0)

## 2021-04-03 LAB — DIC (DISSEMINATED INTRAVASCULAR COAGULATION)PANEL
D-Dimer, Quant: 3.54 ug/mL-FEU — ABNORMAL HIGH (ref 0.00–0.50)
Fibrinogen: 180 mg/dL — ABNORMAL LOW (ref 210–475)
INR: 1.8 — ABNORMAL HIGH (ref 0.8–1.2)
Platelets: 51 10*3/uL — ABNORMAL LOW (ref 150–400)
Prothrombin Time: 20.6 seconds — ABNORMAL HIGH (ref 11.4–15.2)
Smear Review: NONE SEEN
aPTT: 31 seconds (ref 24–36)

## 2021-04-03 LAB — GLUCOSE, CAPILLARY
Glucose-Capillary: 209 mg/dL — ABNORMAL HIGH (ref 70–99)
Glucose-Capillary: 226 mg/dL — ABNORMAL HIGH (ref 70–99)
Glucose-Capillary: 234 mg/dL — ABNORMAL HIGH (ref 70–99)
Glucose-Capillary: 231 mg/dL — ABNORMAL HIGH (ref 70–99)
Glucose-Capillary: 229 mg/dL — ABNORMAL HIGH (ref 70–99)
Glucose-Capillary: 218 mg/dL — ABNORMAL HIGH (ref 70–99)
Glucose-Capillary: 289 mg/dL — ABNORMAL HIGH (ref 70–99)
Glucose-Capillary: 230 mg/dL — ABNORMAL HIGH (ref 70–99)
Glucose-Capillary: 243 mg/dL — ABNORMAL HIGH (ref 70–99)
Glucose-Capillary: 229 mg/dL — ABNORMAL HIGH (ref 70–99)
Glucose-Capillary: 231 mg/dL — ABNORMAL HIGH (ref 70–99)
Glucose-Capillary: 220 mg/dL — ABNORMAL HIGH (ref 70–99)
Glucose-Capillary: 209 mg/dL — ABNORMAL HIGH (ref 70–99)
Glucose-Capillary: 185 mg/dL — ABNORMAL HIGH (ref 70–99)

## 2021-04-03 LAB — BASIC METABOLIC PANEL
Anion gap: 7 (ref 5–15)
Anion gap: 4 — ABNORMAL LOW (ref 5–15)
Anion gap: 5 (ref 5–15)
BUN: 63 mg/dL — ABNORMAL HIGH (ref 6–20)
BUN: 58 mg/dL — ABNORMAL HIGH (ref 6–20)
BUN: 57 mg/dL — ABNORMAL HIGH (ref 6–20)
CO2: 32 mmol/L (ref 22–32)
CO2: 30 mmol/L (ref 22–32)
CO2: 29 mmol/L (ref 22–32)
Calcium: 8.8 mg/dL — ABNORMAL LOW (ref 8.9–10.3)
Calcium: 8.3 mg/dL — ABNORMAL LOW (ref 8.9–10.3)
Calcium: 8.3 mg/dL — ABNORMAL LOW (ref 8.9–10.3)
Chloride: 121 mmol/L — ABNORMAL HIGH (ref 98–111)
Chloride: 120 mmol/L — ABNORMAL HIGH (ref 98–111)
Chloride: 118 mmol/L — ABNORMAL HIGH (ref 98–111)
Creatinine, Ser: 0.87 mg/dL (ref 0.61–1.24)
Creatinine, Ser: 0.87 mg/dL (ref 0.61–1.24)
Creatinine, Ser: 0.87 mg/dL (ref 0.61–1.24)
GFR, Estimated: >60 mL/min (ref >60)
GFR, Estimated: >60 mL/min (ref >60)
GFR, Estimated: >60 mL/min (ref >60)
Glucose, Bld: 272 mg/dL — ABNORMAL HIGH (ref 70–99)
Glucose, Bld: 269 mg/dL — ABNORMAL HIGH (ref 70–99)
Glucose, Bld: 273 mg/dL — ABNORMAL HIGH (ref 70–99)
Potassium: 6.1 mmol/L — ABNORMAL HIGH (ref 3.5–5.1)
Potassium: 6.2 mmol/L — ABNORMAL HIGH (ref 3.5–5.1)
Potassium: 5.6 mmol/L — ABNORMAL HIGH (ref 3.5–5.1)
Sodium: 160 mmol/L — ABNORMAL HIGH (ref 135–145)
Sodium: 154 mmol/L — ABNORMAL HIGH (ref 135–145)
Sodium: 152 mmol/L — ABNORMAL HIGH (ref 135–145)

## 2021-04-03 LAB — HEPATIC FUNCTION PANEL
ALT: 118 U/L — ABNORMAL HIGH (ref 0–44)
AST: 170 U/L — ABNORMAL HIGH (ref 15–41)
Albumin: 2.6 g/dL — ABNORMAL LOW (ref 3.5–5.0)
Alkaline Phosphatase: 153 U/L — ABNORMAL HIGH (ref 38–126)
Bilirubin, Direct: 14.6 mg/dL — ABNORMAL HIGH (ref 0.0–0.2)
Indirect Bilirubin: 8.4 mg/dL — ABNORMAL HIGH (ref 0.3–0.9)
Total Bilirubin: 23 mg/dL (ref 0.3–1.2)
Total Protein: 5.5 g/dL — ABNORMAL LOW (ref 6.5–8.1)

## 2021-04-03 LAB — CBC
HCT: 32.3 % — ABNORMAL LOW (ref 39.0–52.0)
Hemoglobin: 10.5 g/dL — ABNORMAL LOW (ref 13.0–17.0)
MCH: 36.3 pg — ABNORMAL HIGH (ref 26.0–34.0)
MCHC: 32.5 g/dL (ref 30.0–36.0)
MCV: 111.8 fL — ABNORMAL HIGH (ref 80.0–100.0)
Platelets: 48 10*3/uL — ABNORMAL LOW (ref 150–400)
RBC: 2.89 MIL/uL — ABNORMAL LOW (ref 4.22–5.81)
RDW: 20.4 % — ABNORMAL HIGH (ref 11.5–15.5)
WBC: 18.4 10*3/uL — ABNORMAL HIGH (ref 4.0–10.5)
nRBC: 0.3 % — ABNORMAL HIGH (ref 0.0–0.2)

## 2021-04-03 LAB — AMMONIA: Ammonia: 57 umol/L — ABNORMAL HIGH (ref 9–35)

## 2021-04-03 LAB — SODIUM
Sodium: 158 mmol/L — ABNORMAL HIGH (ref 135–145)
Sodium: 154 mmol/L — ABNORMAL HIGH (ref 135–145)

## 2021-04-03 LAB — MAGNESIUM: Magnesium: 3.5 mg/dL — ABNORMAL HIGH (ref 1.7–2.4)

## 2021-04-03 MED ORDER — INSULIN REGULAR(HUMAN) IN NACL 100-0.9 UT/100ML-% IV SOLN
INTRAVENOUS | Status: DC
  Administered 2021-04-03: 12 [IU]/h via INTRAVENOUS
  Administered 2021-04-03: 5 [IU]/h via INTRAVENOUS
  Filled 2021-04-03 (×2): qty 100

## 2021-04-03 MED ORDER — SODIUM POLYSTYRENE SULFONATE 15 GM/60ML PO SUSP
60.0000 g | Freq: Once | ORAL | Status: AC
  Administered 2021-04-03: 60 g
  Filled 2021-04-03: qty 240

## 2021-04-03 MED ORDER — SODIUM ZIRCONIUM CYCLOSILICATE 10 G PO PACK
10.0000 g | PACK | Freq: Two times a day (BID) | ORAL | Status: DC
  Administered 2021-04-03: 10 g
  Filled 2021-04-03: qty 1

## 2021-04-03 MED ORDER — SODIUM POLYSTYRENE SULFONATE 15 GM/60ML PO SUSP
30.0000 g | Freq: Once | ORAL | Status: AC
  Administered 2021-04-03: 30 g
  Filled 2021-04-03: qty 120

## 2021-04-03 MED ORDER — DEXTROSE 50 % IV SOLN: 0.0000 mL | INTRAVENOUS | Status: DC | PRN

## 2021-04-03 NOTE — Progress Notes (Addendum)
NAME:  Edwin West, MRN:  [id number], DOB:  November 12, 1960, LOS: 10 ADMISSION DATE:  04/16/2021, CONSULTATION DATE:  Apr 16, 2021 REFERRING MD:  Criss Alvine, CHIEF COMPLAINT:  Respiratory Failure/ Shock 2/2 Covid 37   Brief Hx  60 year old male who presented 8/16 with severe ARDS secondary to COVID-19, streptococcal pneumonia, multiorgan failure.   Pertinent  Medical History  Cirrhosis - no recent drinking   Significant Hospital Events: Including procedures, antibiotic start and stop dates in addition to other pertinent events   8/16 Admit with R PNA, COVID +. Urine strep + Pf 87 8/17 Peak 26/ Pplat 21, propofol stopped with elevated triglycerides. BC+ for strep pneumoniae 8/18 proned x 16 hours and remain supine. S/p right radial A thrombectomy 8/19 Initially responded to increased PEEP 8/20 Worsening hypoxemia. Proned 8/21 Amio for afib, weaning pressors 8/22 Remains prone 8/23 P/F ratio improved so proning held. Off pressors, diuresis started 8/24 Free water and d5W for hypernatremia 8/26 Off versed drip, PEEP/Fio2 down to 5/40  Interim History / Subjective:   Na continues to be elevated, off versed drip Continues to have poor mental status Received Lokelma for high potassium level   Objective   Blood pressure (!) 147/65, pulse 67, temperature 98.8 F (37.1 C), temperature source Bladder, resp. rate (!) 21, height 5\' 9"  (1.753 m), weight 94.6 kg, SpO2 92 %. CVP:  [9 mmHg-17 mmHg] 15 mmHg  Vent Mode: PRVC FiO2 (%):  [40 %] 40 % Set Rate:  [34 bmp] 34 bmp Vt Set:  [560 mL] 560 mL PEEP:  [5 cmH20-8 cmH20] 5 cmH20 Plateau Pressure:  [16 cmH20-19 cmH20] 16 cmH20   Intake/Output Summary (Last 24 hours) at 04/03/2021 0817 Last data filed at 04/03/2021 0600 Gross per 24 hour  Intake 4506.93 ml  Output 3650 ml  Net 856.93 ml   Filed Weights   03/31/21 0417 04/02/21 0500 04/03/21 0500  Weight: 95 kg 94.6 kg 94.6 kg   Physical Exam: Gen:      No acute distress HEENT:   EOMI, jaundice Neck:     No masses; no thyromegaly, ETT Lungs:    Clear to auscultation bilaterally; normal respiratory effort CV:        Systolic murmur Abd:      + bowel sounds; soft, non-tender; no palpable masses, no distension Ext:    No edema; adequate peripheral perfusion Skin:      Warm and dry; no rash Neuro: Sedated,  Labs/imaging personally reviewed Sodium 160, potassium 6.1, glucose remains elevated at 272 Creatinine 0.87, WBC elevated at 18.4 No new imaging  Resolved Hospital Problem list   Hypothermia  Lactic Acidosis  Septic shock  Assessment & Plan:   ARDS secondary to Streptococcal/Pseudomonal PNA + COVID R radial a-line associated blood clot Strep PNA on R with associated bacteremia likely cause of initial decline, additional COVID positive.   Decompensated alcoholic cirrhosis Sedation Needs for Mechanical Ventilation - Failed propofol 8/17 with elevated triglycerides  Is on minimal vent settings but looks more agonal now.  Check ABG Intermittent chest x-ray Lighten sedation.   Finished remdesivir and steroid  Severe sepsis secondary to Streptococcal/Pseudomonal PNA with Bacteremia-  Off pressors Monitor CVP Narrowed antibiotics to cefazolin to complete 14 days of therapy  AKI - Renal 9/17 within normal limits; probably just septic ATN but should treat empirically for HRS Improving Monitor urine output and creatinine  Paroxysmal Atrial Fibrillation  In NSR now New systolic murmur noted. Will recheck echo  Hepatorenal syndrome Decompensated Cirrhosis 2/2 ?EtOH  Lactulose held due to diarrhea Right upper quadrant ultrasound reviewed Bilirubin markedly elevated with transaminitis.  Will consult GI  Acute metabolic encephalopathy secondary critical illness, sedation, uremia Supportive care, lighten sedation  Possible Ileus proved on KUB Tube feeds at goal  Right radial thrombus provoked 2/2 line in setting of COVID-19 Hold on anticoagulation due to  thrombocytopenia  Thrombocytopenia in setting of sepsis + known prior ETOH use / cirrhosis; low 4T score Off heparin Monitor labs.  Transfuse for active bleeding or platelets less than 10  Hypernatremia Increase D5W and free water Hold Lasix for now as he has significant free water deficit  Hyperglycemia Sugars continue to be elevated.  Start insulin drip  Best Practice (right click and "Reselect all SmartList Selections" daily)  Diet/type: TF DVT prophylaxis: SCDs.  Holding aspirin due to thrombocytopenia GI prophylaxis: PPI Lines: Central line Foley:  yes, still needed  Code Status:  full code Last date of multidisciplinary goals of care discussion: Daughter daily updated by me daily.  Updated at bedside on 8/25 evening  The patient is critically ill with multiple organ system failure and requires high complexity decision making for assessment and support, frequent evaluation and titration of therapies, advanced monitoring, review of radiographic studies and interpretation of complex data.   Critical Care Time devoted to patient care services, exclusive of separately billable procedures, described in this note is 45 minutes.   Chilton Greathouse MD [facility] See Amion for pager  If no response to pager , please call [phone] until 7pm After 7:00 pm call Elink  [phone] 04/03/2021, 8:17 AM

## 2021-04-03 NOTE — Consult Note (Signed)
[facility] Consult: 9:29 AM 04/03/2021  LOS: 10 days    Referring Provider: Dr Bernita Raisin  Primary Care Physician:  Lesleigh Noe, MD Primary Gastroenterologist:  Dr. Bonna Gains     Reason for Consultation: Rising LFTs in a cirrhotic with COVID-19, streptococcal pneumonia, ARDS/sepsis.   HPI: Edwin West is a 60 y.o. male.  PMH Emphysema. Not Covid vaccinated.   Cirrhosis of the liver due to alcohol, ascites first discovered in summer 2019.  Iron deficiency anemia. 06/2018 colonoscopy.  Screening study.  5 mm polyp (hyperplastic) resected from rectum.  Prep described as fair but in adequate for satisfactory exam/colorectal cancer screening and repeat recommended in 1 year.  Will need 2-day prep for next colonoscopy 06/2018 EGD.  Nonbleeding, grade 2 esophageal varices.  Portal hypertensive gastropathy.  Started on nadolol. 07/2018 VCE.  No AVMs or lesions identified.  Nonspecific red spots not consistent with AVMs, no bleeding.     Has also been prescribed oral iron daily resulting in normalization of ferritin and iron levels. Child Pugh class B, MELD 8 in November 2019.  Hepatitis serologies and studies to rule out metabolic, autoimmune hepatitis were unrevealing other than mild elevation of F-actin antibody. Paracentesis in August (7.2 L)and September ( 7.4 L)2019.  No SBP on fluid studies x 2.   Do not see AFP levels in epic.  Per GI office notes patient has not been reliably compliant with follow-ups, nadolol, arranging appointment with hematology.  Per notes he has maintained sobriety. Prescribed diuretic doses were Lasix 60 mg daily, Aldactone 200 mg daily.  However at office visit in January 07, 2021 he was only taking 20 of Lasix and 100 of Aldactone.  Although patient did not complain of increased  distention, Dr. Bonna Gains noted distention and asked him to start taking the Lasix 60 daily, Aldactone 200 daily.  Before the GI OV, pt had started OTC Prilosec prn for epigastric discomfort.  MD suggested he take the omeprazole 40 mg daily rather than prn.  Serum H Ptylori IgM, IgA, IgG antibodies were all within normal limits on 01/07/2021 197#/89.4 Kg at OV.    Pt now day 67 of admission w [id number] pneumonia complicated by ARDS, sepsis, multiorgan failure.  + Coagulopathy.  Atrial fibrillation.  Remains intubated.  Tolerating tube feeds.   04/02/2021 R radial artery embolectomy for acute occlusion. LFTs are rising T bili 1.7 >> 23.  Alk phos 472 >> 153.  AST/ALT 58/36 >> 170/118. Ammonia level not assayed INR 1.7-1.8.  AKI improved, GFR now greater than 60.  Hypernatremic into the 160s, 159 today Hgb has been improving from a low of 8.5 to 10.5 today.  Thrombocytopenia with nadir of 27K to 51K today.   04/01/2021 abdominal ultrasound limited: Cirrhosis, small RUQ ascites.  CBD and portal vein not well delineated.  Partially distended GB with wall thickness of 3 mm likely due to chronic liver disease.  No gallstones.    Patient is intubated so unable to complete a GI or any other type of review of systems.  Family history  of cervical cancer in his sister.  Heart failure in his sister and some other family member.  Stroke in his brother.  Hypertension in his father.  Smoker.  21.5 pack year history.  At presentation/admission denied recent alcohol use.    Past Medical History:  Diagnosis Date   Cirrhosis of liver ([place]) 04/2018    Past Surgical History:  Procedure Laterality Date   COLONOSCOPY WITH PROPOFOL N/A 06/16/2018   Procedure: COLONOSCOPY WITH PROPOFOL;  Surgeon: Virgel Manifold, MD;  Location: [facility];  Service: Endoscopy;  Laterality: N/A;   ESOPHAGOGASTRODUODENOSCOPY (EGD) WITH PROPOFOL N/A 06/16/2018   Procedure: ESOPHAGOGASTRODUODENOSCOPY (EGD) WITH  PROPOFOL;  Surgeon: Virgel Manifold, MD;  Location: [facility]COPY;  Service: Endoscopy;  Laterality: N/A;   GIVENS CAPSULE STUDY N/A 07/12/2018   Procedure: GIVENS CAPSULE STUDY;  Surgeon: Virgel Manifold, MD;  Location: [facility];  Service: Endoscopy;  Laterality: N/A;   THROMBECTOMY BRACHIAL ARTERY Right 03/27/2021   Procedure: THROMBECTOMY RIGHT ARM;  Surgeon: Waynetta Sandy, MD;  Location: [facility];  Service: Vascular;  Laterality: Right;    Prior to Admission medications   Medication Sig Start Date End Date Taking? Authorizing Provider  albuterol (VENTOLIN HFA) 108 (90 Base) MCG/ACT inhaler TAKE 2 PUFFS BY MOUTH EVERY 6 HOURS AS NEEDED FOR WHEEZE OR SHORTNESS OF BREATH Patient taking differently: Inhale 2 puffs into the lungs every 6 (six) hours as needed for wheezing or shortness of breath. 02/16/21   Lesleigh Noe, MD  Fluticasone-Salmeterol (ADVAIR DISKUS) 250-50 MCG/DOSE AEPB Inhale 1 puff into the lungs 2 (two) times daily. 11/10/20   Lesleigh Noe, MD  furosemide (LASIX) 20 MG tablet Take 2 tablets (40 mg total) by mouth daily. 01/07/21 04/07/21  Virgel Manifold, MD  ibuprofen (ADVIL) 400 MG tablet Take 400 mg by mouth every 6 (six) hours as needed for moderate pain or mild pain.    [provider]  omeprazole (PRILOSEC) 40 MG capsule Take 1 capsule (40 mg total) by mouth daily for 28 days. 01/07/21 05/16/21  Virgel Manifold, MD  spironolactone (ALDACTONE) 100 MG tablet Take 2 tablets (200 mg total) by mouth daily for 90 doses. 01/07/21 04/07/21  Virgel Manifold, MD  traMADol (ULTRAM) 50 MG tablet Take 1 tablet (50 mg total) by mouth every 8 (eight) hours as needed for moderate pain. 01/07/21   Copland, Frederico Hamman, MD    Scheduled Meds:  budesonide (PULMICORT) nebulizer solution  0.5 mg Nebulization BID   And   arformoterol  15 mcg Nebulization BID   chlorhexidine gluconate (MEDLINE KIT)  15 mL Mouth Rinse BID   Chlorhexidine Gluconate Cloth  6 each  Topical Q0600   feeding supplement (PROSource TF)  90 mL Per Tube BID   free water  300 mL Per Tube Q4H   mouth rinse  15 mL Mouth Rinse 10 times per day   pantoprazole sodium  40 mg Per Tube QHS   sodium chloride flush  10-40 mL Intracatheter Q12H   sodium zirconium cyclosilicate  10 g Per Tube BID   Infusions:  sodium chloride Stopped (03/11/2021 1241)    ceFAZolin (ANCEF) IV Stopped (04/03/21 0547)   dextrose 250 mL/hr at 04/03/21 0928   feeding supplement (VITAL 1.5 CAL) 60 mL/hr at 04/01/21 2226   fentaNYL infusion INTRAVENOUS 75 mcg/hr (04/03/21 0600)   insulin     PRN Meds: albuterol, dextrose, fentaNYL, midazolam, ondansetron (ZOFRAN) IV, polyethylene glycol, sennosides, sodium chloride flush   Allergies as of  03/19/2021   (No Known Allergies)    Family History  Problem Relation Age of Onset   Heart failure Sister    Cervical cancer Sister    Breast cancer Other 41   Heart failure Other    Hypertension Father    Stroke Brother     Social History   Socioeconomic History   Marital status: Single    Spouse name: Not on file   Number of children: 3   Years of education: 11th grade   Highest education level: Not on file  Occupational History   Not on file  Tobacco Use   Smoking status: Every Day    Packs/day: 0.50    Years: 43.00    Pack years: 21.50    Types: Cigarettes   Smokeless tobacco: Former    Types: Chew   Tobacco comments:    previously smoked more than 1/2 ppd  Vaping Use   Vaping Use: Some days   Substances: Nicotine  Substance and Sexual Activity   Alcohol use: Not Currently    Comment: sober - 2019   Drug use: Not Currently    Types: Marijuana, Oxycodone    Comment: none since 80'   Sexual activity: Yes    Partners: Female    Birth control/protection: Condom  Other Topics Concern   Not on file  Social History Narrative   09/10/20   From: the area   Living: with daughter - Lannette Donath    Work: pipe fitter       Family: 3 children -  [place], Research scientist (medical), Museum/gallery conservator - 3 grandchildren      Enjoys: resting, has low energy      Exercise: work is active   Diet: meat and some veggies/fruit      Safety   Seat belts: Yes    Guns: Yes  and secure   Safe in relationships: Yes    Social Determinants of Radio broadcast assistant Strain: Not on file  Food Insecurity: Not on file  Transportation Needs: Not on file  Physical Activity: Not on file  Stress: Not on file  Social Connections: Not on file  Intimate Partner Violence: Not on file    REVIEW OF SYSTEMS: Unable to obtain a review of systems from this patient who is sedated on vent. Per review of charting he continues to have poor mental status even off Versed drip.  PHYSICAL EXAM: Vital signs in last 24 hours: Vitals:   04/03/21 0752 04/03/21 0754  BP:    Pulse:    Resp:    Temp:    SpO2: 92% 92%   Wt Readings from Last 3 Encounters:  04/03/21 94.6 kg  01/07/21 89.4 kg  01/07/21 89.1 kg    General: Intubated, looks ill, ashen coloring. Head: No signs of head trauma or swelling. Eyes: Scleral icterus. Ears: Unable to assess hearing as he is sedated. Nose: No congestion or discharge.  Small bore feeding tube in L nare, tube feeds running at 60 mL/hour. Mouth: OG tube in place.  Terrible dentition with many missing teeth, extensive caries/necrosis.  Mucosa dry Neck: No JVD visible. Lungs: Intubated, even/rhythmic breathing. Heart: RRR. Abdomen: Soft, active bowel sounds.  No tenderness elicited with palpation.  No organomegaly, masses, hernias appreciated..   GU: No scrotal edema Rectal: Flexi-Seal in place with nonbloody, brown watery/liquid stool. Musc/Skeltl: No gross joint deformities. Extremities: 1-2+ pitting at the top of the left foot but no pitting edema on the right foot. Neurologic: Unresponsive to exam  and voice.  Did not apply noxious stimuli. Skin: Few telangiectasia across the upper chest.  Palmar erythema   Intake/Output from previous  day: 08/25 0701 - 08/26 0700 In: 4656.2 [I.V.:3006; NG/GT:1350; IV Piggyback:300.3] Out: 3650 [Urine:3650] Intake/Output this shift: No intake/output data recorded.  LAB RESULTS: Recent Labs    04/01/21 0311 04/02/21 0212 04/02/21 0436 04/03/21 0401  WBC 10.1  --  13.7* 18.4*  HGB 9.3* 10.9* 10.1* 10.5*  HCT 28.7* 32.0* 28.1* 32.3*  PLT 33*  --  40* 48*   BMET Lab Results  Component Value Date   NA 160 (H) 04/03/2021   NA 159 (H) 04/02/2021   NA 161 (HH) 04/02/2021   K 6.1 (H) 04/03/2021   K 5.3 (H) 04/02/2021   K 5.1 04/02/2021   CL 121 (H) 04/03/2021   CL 126 (H) 04/02/2021   CL 126 (H) 04/01/2021   CO2 32 04/03/2021   CO2 32 04/02/2021   CO2 35 (H) 04/01/2021   GLUCOSE 272 (H) 04/03/2021   GLUCOSE 248 (H) 04/02/2021   GLUCOSE 273 (H) 04/01/2021   BUN 63 (H) 04/03/2021   BUN 78 (H) 04/02/2021   BUN 93 (H) 04/01/2021   CREATININE 0.87 04/03/2021   CREATININE 0.94 04/02/2021   CREATININE 0.87 04/01/2021   CALCIUM 8.8 (L) 04/03/2021   CALCIUM 8.9 04/02/2021   CALCIUM 8.6 (L) 04/01/2021   LFT Recent Labs    04/02/21 1023  PROT 5.9*  ALBUMIN 2.9*  AST 201*  ALT 129*  ALKPHOS 142*  BILITOT 18.8*   PT/INR Lab Results  Component Value Date   INR 1.7 (H) 03/31/2021   INR 1.7 (H) 03/30/2021   INR 1.7 (H) 03/29/2021   Hepatitis Panel No results for input(s): HEPBSAG, HCVAB, HEPAIGM, HEPBIGM in the last 72 hours. C-Diff No components found for: CDIFF Lipase     Component Value Date/Time   LIPASE 32 04/03/2018 1209    Drugs of Abuse  No results found for: LABOPIA, COCAINSCRNUR, LABBENZ, AMPHETMU, THCU, LABBARB   RADIOLOGY STUDIES: US Abdomen Limited RUQ (LIVER/GB)  Result Date: 04/01/2021 CLINICAL DATA:  Elevated LFTs.  History of cirrhosis. EXAM: ULTRASOUND ABDOMEN LIMITED RIGHT UPPER QUADRANT COMPARISON:  Abdominal ultrasound 04/03/2018 FINDINGS: Gallbladder: Partially distended. No gallstones. Borderline wall thickness of 3 mm, likely secondary  to chronic liver disease. No sonographic Murphy sign noted by sonographer. Common bile duct: Not clearly delineated on the current exam. Liver: Heterogeneous hepatic parenchymal echogenicity. Nodular capsular contours. No obvious focal lesion. Portal vein was not clearly delineated on the current exam. Other: Small volume right upper quadrant ascites. Technically challenging and limited exam due to intubated patient. IMPRESSION: 1. Hepatic cirrhosis.  Small volume ascites. 2. Technical limited evaluation due to patient's medical condition. Common bile duct and portal vein are not well delineated on the current exam. 3. Partially distended gallbladder. Borderline wall thickness of 3 mm is likely due to chronic liver disease. No gallstones. Electronically Signed   By: Keith Rake M.D.   On: 04/01/2021 19:30      IMPRESSION:     Covid 19/streptococcal PNA w sepsis, ARDS, multiorgan failure.      Cirrhosis. Small volume ascites.  Rising LFTs.  MELD and MELD-Na is 25.  Discr Fx score is 58.       coagulopathy.       AKI.  Improving.    A fib.    Hypernatremia.  Acute metabolic encephalopathy.  ? HE, no current or recent past records of ammonia levels.  Was receiving lactulose during this admission but discontinued 8/24 due to diarrhea.     Thrombocytopenia.  Hyperglycemia with serum glucose is into the mid 200s-mid 300s.  On insulin.  No pre-existing diagnosis of DM 2.      PLAN:       Check ammonia level.  Add Lactulose   Azucena Freed  04/03/2021, 9:29 AM Phone [phone]

## 2021-04-03 NOTE — Progress Notes (Signed)
eLink Physician-Brief Progress Note Patient Name: Edwin West DOB: 05-06-1961 MRN: [id number]   Date of Service  04/03/2021  HPI/Events of Note  K+ 6.1.  eICU Interventions  Lokelma 10 gm via NG tube Q 12 hours x 2 doses.        Thomasene Lot Odessa Paone 04/03/2021, 5:08 AM

## 2021-04-03 NOTE — Progress Notes (Signed)
Patient had recent echo 03/25/21.   Please advise echo department if repeat echo is necessary.

## 2021-04-03 NOTE — Progress Notes (Signed)
eLink Physician-Brief Progress Note Patient Name: Edwin West DOB: [birth year] MRN: [id number]   Date of Service  04/03/2021  HPI/Events of Note  Patient dyssynchronous on the ventilator earlier but has improved with a Fentanyl bolus, he also has PRN Versed on the MAR, heart rate ia on the low side of normal and his Triglycerides are elevated.  eICU Interventions  Bedside RN encouraged to treat dyssynchrony with PRN Fentanyl and  / or Versed.        Migdalia Dk 04/03/2021, 10:52 PM

## 2021-04-04 ENCOUNTER — Other Ambulatory Visit (HOSPITAL_COMMUNITY): Payer: Commercial Managed Care - PPO

## 2021-04-04 ENCOUNTER — Inpatient Hospital Stay (HOSPITAL_COMMUNITY): Payer: Commercial Managed Care - PPO

## 2021-04-04 DIAGNOSIS — R011 Cardiac murmur, unspecified: Secondary | ICD-10-CM | POA: Diagnosis not present

## 2021-04-04 DIAGNOSIS — J9601 Acute respiratory failure with hypoxia: Secondary | ICD-10-CM | POA: Diagnosis not present

## 2021-04-04 LAB — COMPREHENSIVE METABOLIC PANEL
ALT: 115 U/L — ABNORMAL HIGH (ref 0–44)
AST: 178 U/L — ABNORMAL HIGH (ref 15–41)
Albumin: 2.2 g/dL — ABNORMAL LOW (ref 3.5–5.0)
Alkaline Phosphatase: 136 U/L — ABNORMAL HIGH (ref 38–126)
Anion gap: 4 — ABNORMAL LOW (ref 5–15)
BUN: 57 mg/dL — ABNORMAL HIGH (ref 6–20)
CO2: 30 mmol/L (ref 22–32)
Calcium: 8.2 mg/dL — ABNORMAL LOW (ref 8.9–10.3)
Chloride: 116 mmol/L — ABNORMAL HIGH (ref 98–111)
Creatinine, Ser: 0.97 mg/dL (ref 0.61–1.24)
GFR, Estimated: >60 mL/min (ref >60)
Glucose, Bld: 167 mg/dL — ABNORMAL HIGH (ref 70–99)
Potassium: 4.7 mmol/L (ref 3.5–5.1)
Sodium: 150 mmol/L — ABNORMAL HIGH (ref 135–145)
Total Bilirubin: 25 mg/dL (ref 0.3–1.2)
Total Protein: 4.9 g/dL — ABNORMAL LOW (ref 6.5–8.1)

## 2021-04-04 LAB — CBC
HCT: 25.4 % — ABNORMAL LOW (ref 39.0–52.0)
Hemoglobin: 9.3 g/dL — ABNORMAL LOW (ref 13.0–17.0)
MCH: 43.1 pg — ABNORMAL HIGH (ref 26.0–34.0)
MCHC: 36.6 g/dL — ABNORMAL HIGH (ref 30.0–36.0)
MCV: 117.6 fL — ABNORMAL HIGH (ref 80.0–100.0)
Platelets: 50 10*3/uL — ABNORMAL LOW (ref 150–400)
RBC: 2.16 MIL/uL — ABNORMAL LOW (ref 4.22–5.81)
RDW: 24.1 % — ABNORMAL HIGH (ref 11.5–15.5)
WBC: 18.3 10*3/uL — ABNORMAL HIGH (ref 4.0–10.5)
nRBC: 0.6 % — ABNORMAL HIGH (ref 0.0–0.2)

## 2021-04-04 LAB — ECHOCARDIOGRAM LIMITED
Height: 69 in
Weight: 3326.3 oz

## 2021-04-04 LAB — MAGNESIUM: Magnesium: 3.1 mg/dL — ABNORMAL HIGH (ref 1.7–2.4)

## 2021-04-04 LAB — GLUCOSE, CAPILLARY
Glucose-Capillary: 126 mg/dL — ABNORMAL HIGH (ref 70–99)
Glucose-Capillary: 167 mg/dL — ABNORMAL HIGH (ref 70–99)
Glucose-Capillary: 172 mg/dL — ABNORMAL HIGH (ref 70–99)
Glucose-Capillary: 188 mg/dL — ABNORMAL HIGH (ref 70–99)
Glucose-Capillary: 194 mg/dL — ABNORMAL HIGH (ref 70–99)
Glucose-Capillary: 196 mg/dL — ABNORMAL HIGH (ref 70–99)
Glucose-Capillary: 199 mg/dL — ABNORMAL HIGH (ref 70–99)
Glucose-Capillary: 218 mg/dL — ABNORMAL HIGH (ref 70–99)
Glucose-Capillary: 226 mg/dL — ABNORMAL HIGH (ref 70–99)

## 2021-04-04 LAB — PHOSPHORUS: Phosphorus: 4.5 mg/dL (ref 2.5–4.6)

## 2021-04-04 LAB — SODIUM
Sodium: 150 mmol/L — ABNORMAL HIGH (ref 135–145)
Sodium: 148 mmol/L — ABNORMAL HIGH (ref 135–145)

## 2021-04-04 LAB — HAPTOGLOBIN: Haptoglobin: 39 mg/dL (ref 29–370)

## 2021-04-04 LAB — MRSA NEXT GEN BY PCR, NASAL: MRSA by PCR Next Gen: NOT DETECTED

## 2021-04-04 MED ORDER — ALBUMIN HUMAN 25 % IV SOLN
12.5000 g | Freq: Once | INTRAVENOUS | Status: AC
  Administered 2021-04-04: 12.5 g via INTRAVENOUS
  Filled 2021-04-04: qty 50

## 2021-04-04 MED ORDER — FUROSEMIDE 10 MG/ML IJ SOLN
40.0000 mg | Freq: Once | INTRAMUSCULAR | Status: AC
  Administered 2021-04-04: 40 mg via INTRAVENOUS
  Filled 2021-04-04: qty 4

## 2021-04-04 MED ORDER — INSULIN DETEMIR 100 UNIT/ML ~~LOC~~ SOLN
14.0000 [IU] | Freq: Two times a day (BID) | SUBCUTANEOUS | Status: DC
  Administered 2021-04-04 – 2021-04-05 (×4): 14 [IU] via SUBCUTANEOUS
  Filled 2021-04-04 (×7): qty 0.14

## 2021-04-04 MED ORDER — SODIUM CHLORIDE 0.9 % IV SOLN
2.0000 g | Freq: Three times a day (TID) | INTRAVENOUS | Status: DC
  Administered 2021-04-04 (×3): 2 g via INTRAVENOUS
  Filled 2021-04-04 (×3): qty 2

## 2021-04-04 MED ORDER — FUROSEMIDE 10 MG/ML IJ SOLN
INTRAMUSCULAR | Status: AC
  Administered 2021-04-04: 40 mg via INTRAVENOUS
  Filled 2021-04-04: qty 4

## 2021-04-04 MED ORDER — FUROSEMIDE 10 MG/ML IJ SOLN: 40.0000 mg | Freq: Once | INTRAMUSCULAR | Status: AC

## 2021-04-04 MED ORDER — INSULIN ASPART 100 UNIT/ML IJ SOLN
2.0000 [IU] | INTRAMUSCULAR | Status: DC
  Administered 2021-04-04: 2 [IU] via SUBCUTANEOUS
  Administered 2021-04-04: 6 [IU] via SUBCUTANEOUS
  Administered 2021-04-04 (×2): 4 [IU] via SUBCUTANEOUS
  Administered 2021-04-04: 6 [IU] via SUBCUTANEOUS
  Administered 2021-04-05: 4 [IU] via SUBCUTANEOUS
  Administered 2021-04-05 (×2): 2 [IU] via SUBCUTANEOUS
  Administered 2021-04-05 (×3): 4 [IU] via SUBCUTANEOUS
  Administered 2021-04-06: 2 [IU] via SUBCUTANEOUS

## 2021-04-04 NOTE — Progress Notes (Signed)
PROGRESS NOTE FOR [facility]  Subjective: Intubated.  Objective: Vital signs in last 24 hours: Temp:  [99.3 F (37.4 C)-100.4 F (38 C)] 99.3 F (37.4 C) (08/27 0900) Pulse Rate:  [67-93] 87 (08/27 0900) Resp:  [18-40] 37 (08/27 0900) BP: (108-154)/(50-80) 115/67 (08/27 0900) SpO2:  [88 %-93 %] 93 % (08/27 0900) FiO2 (%):  [40 %-60 %] 60 % (08/27 0856) Weight:  [94.3 kg] 94.3 kg (08/27 0500) Last BM Date: 04/03/21 (flexiseal)  Intake/Output from previous day: 08/26 0701 - 08/27 0700 In: 8366.2 [I.V.:5093.2; NG/GT:1320; IV Piggyback:200.1] Out: 2600 [Urine:2600] Intake/Output this shift: No intake/output data recorded.  General appearance: intubated and sedated Resp: clear to auscultation bilaterally Cardio: regular rate and rhythm GI: distended, + ascites Extremities: edema 4+  Lab Results: Recent Labs    04/02/21 0436 04/03/21 0401 04/03/21 1125 04/03/21 1231 04/04/21 0245  WBC 13.7* 18.4*  --   --  18.3*  HGB 10.1* 10.5*  --  10.5* 9.3*  HCT 28.1* 32.3*  --  31.0* 25.4*  PLT 40* 48* 51*  --  50*   BMET Recent Labs    04/03/21 1521 04/03/21 1742 04/03/21 2226 04/04/21 0245  NA 154* 152* 154* 150*  K 6.2* 5.6*  --  4.7  CL 120* 118*  --  116*  CO2 30 29  --  30  GLUCOSE 269* 273*  --  167*  BUN 58* 57*  --  57*  CREATININE 0.87 0.87  --  0.97  CALCIUM 8.3* 8.3*  --  8.2*   LFT Recent Labs    04/03/21 1125 04/04/21 0245  PROT 5.5* 4.9*  ALBUMIN 2.6* 2.2*  AST 170* 178*  ALT 118* 115*  ALKPHOS 153* 136*  BILITOT 23.0* 25.0*  BILIDIR 14.6*  --   IBILI 8.4*  --    PT/INR Recent Labs    04/03/21 1125  LABPROT 20.6*  INR 1.8*   Hepatitis Panel No results for input(s): HEPBSAG, HCVAB, HEPAIGM, HEPBIGM in the last 72 hours. C-Diff No results for input(s): CDIFFTOX in the last 72 hours. Fecal Lactopherrin No results for input(s): FECLLACTOFRN in the last 72 hours.  Studies/Results: DG Chest Port 1 View  Result Date:  04/04/2021 CLINICAL DATA:  Acute respiratory failure. EXAM: PORTABLE CHEST 1 VIEW COMPARISON:  03/31/2021 FINDINGS: ETT tip is above the carina. There is a left subclavian central venous catheter with tip projecting over the SVC. There has been significant interval worsening of aeration to the right mid lung and right lower lobe. Worsening aeration to the left lung base is also noted. Suspect new bilateral pleural effusions. IMPRESSION: 1. Worsening aeration to both lungs, right greater than left. 2. Suspect new bilateral pleural effusions. Electronically Signed   By: Kerby Moors M.D.   On: 04/04/2021 08:27    Medications: Scheduled:  budesonide (PULMICORT) nebulizer solution  0.5 mg Nebulization BID   And   arformoterol  15 mcg Nebulization BID   chlorhexidine gluconate (MEDLINE KIT)  15 mL Mouth Rinse BID   Chlorhexidine Gluconate Cloth  6 each Topical Q0600   feeding supplement (PROSource TF)  90 mL Per Tube BID   free water  300 mL Per Tube Q4H   insulin aspart  2-6 Units Subcutaneous Q4H   mouth rinse  15 mL Mouth Rinse 10 times per day   pantoprazole sodium  40 mg Per Tube QHS   sodium chloride flush  10-40 mL Intracatheter Q12H   Continuous:  sodium chloride Stopped (03/29/2021 1241)  ceFEPime (MAXIPIME) IV     feeding supplement (VITAL 1.5 CAL) 1,000 mL (04/03/21 2240)   fentaNYL infusion INTRAVENOUS 75 mcg/hr (04/04/21 0500)    Assessment/Plan: 1) Hepatic failure in the setting of cirrhosis. 2) Respiratory failure. 3) COVID-19.  4) Anemia.   The patient remains critically ill.  His TB continues to increase.  The INR is relatively stable at 1.8 as well as his liver enzymes.  The hepatic failure is multifactorial.  It is not clear if the patient will be able to make a recovery.  Currently he is not a transplant candidate.  Plan: 1) Continue with supportive care. 2) Monitor liver panel and INR.  LOS: 11 days   Edwin West D 04/04/2021, 10:43 AM

## 2021-04-04 NOTE — Progress Notes (Signed)
NAME:  Edwin West, MRN:  [id number], DOB:  1961/05/12, LOS: 11 ADMISSION DATE:  03/22/2021, CONSULTATION DATE:  04/01/2021 REFERRING MD:  Criss Alvine, CHIEF COMPLAINT:  Respiratory Failure/ Shock 2/2 Covid 31   Brief Hx  60 year old male who presented 8/16 with severe ARDS secondary to COVID-19, streptococcal pneumonia, multiorgan failure.   Pertinent  Medical History  Cirrhosis - no recent drinking   Significant Hospital Events: Including procedures, antibiotic start and stop dates in addition to other pertinent events   8/16 Admit with R PNA, COVID +. Urine strep + Pf 87 8/17 Peak 26/ Pplat 21, propofol stopped with elevated triglycerides. BC+ for strep pneumoniae 8/18 proned x 16 hours and remain supine. S/p right radial A thrombectomy 8/19 Initially responded to increased PEEP 8/20 Worsening hypoxemia. Proned 8/21 Amio for afib, weaning pressors 8/22 Remains prone 8/23 P/F ratio improved so proning held. Off pressors, diuresis started 8/24 Free water and d5W for hypernatremia 8/26 Off versed drip, PEEP/Fio2 down to 5/40. Gi consulted for elevated LFTs  Interim History / Subjective:   Remains unresponsive, worsening LFTs noted and GI consulted  Objective   Blood pressure 122/64, pulse 68, temperature 99.7 F (37.6 C), resp. rate (!) 27, height 5\' 9"  (1.753 m), weight 94.3 kg, SpO2 91 %. CVP:  [18 mmHg-20 mmHg] 20 mmHg  Vent Mode: PRVC FiO2 (%):  [40 %-60 %] 60 % Set Rate:  [34 bmp] 34 bmp Vt Set:  [560 mL] 560 mL PEEP:  [5 cmH20] 5 cmH20 Plateau Pressure:  [17 cmH20-21 cmH20] 21 cmH20   Intake/Output Summary (Last 24 hours) at 04/04/2021 0809 Last data filed at 04/04/2021 0600 Gross per 24 hour  Intake 6553.25 ml  Output 2600 ml  Net 3953.25 ml   Filed Weights   04/02/21 0500 04/03/21 0500 04/04/21 0500  Weight: 94.6 kg 94.6 kg 94.3 kg   Physical Exam: Gen:      No acute distress, chronically ill appearing HEENT:  EOMI, sclera jaundiced Neck:     No masses;  no thyromegaly, ETT Lungs:    Clear to auscultation bilaterally; normal respiratory effort CV:         Regular rate and rhythm; no murmurs Abd:      + bowel sounds; soft, non-tender; no palpable masses, no distension Ext:    No edema; adequate peripheral perfusion Skin:      Warm and dry; no rash Neuro: Sedated, unresponsive  Labs/imaging personally reviewed Sodium improved to 150, creatinine 0.97 LFTs remain elevated, total bilirubin 25 Chest x-ray reviewed with worsening right lower lobe consolidation.  Resolved Hospital Problem list   Hypothermia  Lactic Acidosis  Septic shock AKI  Assessment & Plan:   ARDS secondary to Streptococcal/Pseudomonal PNA + COVID R radial a-line associated blood clot Strep PNA on R with associated bacteremia likely cause of initial decline, additional COVID positive.   Decompensated alcoholic cirrhosis Sedation Needs for Mechanical Ventilation - Failed propofol 8/17 with elevated triglycerides  Continue vent support.  He has a new right lower lobe infiltrate concerning for HAP Broaden antibiotics to cefepime, check MRSA PCR and trach aspirate  Severe sepsis secondary to Streptococcal/Pseudomonal PNA with Bacteremia-  Off pressors Monitor CVP Antibiotics as above  Paroxysmal Atrial Fibrillation  In NSR now New systolic murmur noted. Will recheck echo  Hepatorenal syndrome Decompensated Cirrhosis 2/2 ?EtOH Lactulose held due to diarrhea Right upper quadrant ultrasound reviewed Bilirubin markedly elevated with transaminitis.   Appreciate input from GI  Acute metabolic encephalopathy secondary critical illness,  sedation, uremia Supportive care, lighten sedation  Possible Ileus proved on KUB Tube feeds at goal  Right radial thrombus provoked 2/2 line in setting of COVID-19 Hold on anticoagulation due to thrombocytopenia  Thrombocytopenia in setting of sepsis + known prior ETOH use / cirrhosis; Low-grade DIC Off heparin Monitor labs.   Transfuse for active bleeding or platelets less than 10  Hypernatremia > improving Continue free water Hold Lasix for now as he has significant free water deficit.  Consider resuming tomorrow if his sodium is normalized  Hyperglycemia SSI coverage  Best Practice (right click and "Reselect all SmartList Selections" daily)  Diet/type: TF DVT prophylaxis: SCDs.  Holding heparin due to thrombocytopenia GI prophylaxis: PPI Lines: Central line Foley:  yes, still needed  Code Status:  full code Last date of multidisciplinary goals of care discussion: Daughter daily updated by me daily.  Updated at bedside on 8/25 evening  The patient is critically ill with multiple organ system failure and requires high complexity decision making for assessment and support, frequent evaluation and titration of therapies, advanced monitoring, review of radiographic studies and interpretation of complex data.   Critical Care Time devoted to patient care services, exclusive of separately billable procedures, described in this note is 45 minutes.   Chilton Greathouse MD [facility] See Amion for pager  If no response to pager , please call [phone] until 7pm After 7:00 pm call Elink  [phone] 04/04/2021, 8:09 AM

## 2021-04-04 NOTE — Progress Notes (Addendum)
eLink Physician-Brief Progress Note Patient Name: Edwin West DOB: [birth year] MRN: [id number]   Date of Service  04/04/2021  HPI/Events of Note  Patient's serum sodium now down to 152, he is getting 300 ml of free water Q 4 hours via NG tube and 250 ml / hour iv D 5 % water gtt. He is also on an insulin gtt, last blood sugar was 167 mg / dl.  eICU Interventions  D 5 % water gtt and Insulin gtt discontinued, and  patient transitioned to Hyperglycemia SQ Insulin protocol.        [name] 04/04/2021, 2:43 AM

## 2021-04-04 NOTE — Progress Notes (Addendum)
eLink Physician-Brief Progress Note Patient Name: Edwin West DOB: 1960/08/26 MRN: [id number]   Date of Service  04/04/2021  HPI/Events of Note  Lungs sound rhonchus and wet with CVP 18-19,  Rn adjusted FIO2 from 60 to 100 temporarily as well as adjusting Peep on her own due to desating,  Camera: Peep down to 5  current   PRVC   80/5/34/560 .  pt is gupping breathing. Crackles. No cough, no gag as per RN.  Off of fentanyl since AM. Primary team is aware of this breathing since AM. UOP low, 20 /hr. 600 today as per RN.  Cirrhosis Pneumonia/Covid PNA. BC+ Strep PNA. Full code. P A fib. Encephalopathy.  Thrombocytopenia, right radial thrombus. Not on AC. Elevated LFT.  AHRF   eICU Interventions  -lasix IV once stat - no sedation. - guarded prognosis.      Intervention Category Intermediate Interventions: Respiratory distress - evaluation and management  Ranee Gosselin 04/04/2021, 8:40 PM  22:04 UOP: nill post Lasix.  Re ordered Lasix + albumin.   4:16 AM labs reviewed. K at 5.7, Cr up 2.22, BUN 87. Co2 at 27 2. Anemia sable. Wbc 11.9 K, Plt 53 K  Discussed with RN. Start Levophed gtt. Stat ABG. Potassium in AM.  6:14 K going up to 5.7 Dextrose and insulin shift. follow K at 8 AM.

## 2021-04-04 NOTE — Progress Notes (Signed)
   04/04/21 0856  Airway 8 mm  Placement Date/Time: 03/13/2021 1350   Placed By: (c) ICU physician  Laryngoscope Blade: MAC;4  ETT Types: Oral  Size (mm): 8 mm  Cuffed: Cuffed  Insertion attempts: 1  Airway Equipment: Stylet  Placement Confirmation: (c) Direct Visualization;ETCO2 Det...  Secured at (cm) 26 cm  Measured From Lips  Secured Location Left  Secured By Actuary Repositioned Yes  Prone position No  Cuff Pressure (cm H2O) Green OR 18-26 CmH2O  Site Condition Dry  Adult Ventilator Settings  Vent Type Servo i  Humidity HME  Vent Mode PRVC  Vt Set 560 mL  Set Rate 34 bmp  FiO2 (%) 60 % (tried 50% spo2 down to 88)  I Time 0.7 Sec(s)  PEEP 5 cmH20  Adult Ventilator Measurements  Peak Airway Pressure 21 L/min  Mean Airway Pressure 13 cmH20  Plateau Pressure 12 cmH20  Resp Rate Spontaneous 4 br/min  Resp Rate Total 38 br/min  Exhaled Vt 612 mL  Measured Ve 21.9 mL  I:E Ratio Measured 1:1.5  Auto PEEP 0 cmH20  Total PEEP 5 cmH20  SpO2 93 %  Adult Ventilator Alarms  Alarms On Y  Ve High Alarm 26 L/min  Ve Low Alarm 4 L/min  Resp Rate High Alarm 45 br/min  Resp Rate Low Alarm 10  PEEP Low Alarm 3 cmH2O  Press High Alarm 45 cmH2O  Daily Weaning Assessment  Daily Assessment of Readiness to Wean Wean protocol criteria not met  Reason not met Fi02 > 40%  Breath Sounds  Bilateral Breath Sounds Rhonchi  Airway Suctioning/Secretions  Suction Type ETT  Suction Device  Catheter  Secretion Amount Small  Secretion Color White  Secretion Consistency Thick  Suction Tolerance Tolerated well  Suctioning Adverse Effects None

## 2021-04-04 NOTE — Progress Notes (Signed)
  Echocardiogram 2D Echocardiogram has been performed.  Delcie Roch 04/04/2021, 3:18 PM

## 2021-04-05 DIAGNOSIS — Z515 Encounter for palliative care: Secondary | ICD-10-CM

## 2021-04-05 DIAGNOSIS — J9601 Acute respiratory failure with hypoxia: Secondary | ICD-10-CM | POA: Diagnosis not present

## 2021-04-05 DIAGNOSIS — Z7189 Other specified counseling: Secondary | ICD-10-CM

## 2021-04-05 LAB — CBC
HCT: 27.5 % — ABNORMAL LOW (ref 39.0–52.0)
Hemoglobin: 9.1 g/dL — ABNORMAL LOW (ref 13.0–17.0)
MCH: 38.1 pg — ABNORMAL HIGH (ref 26.0–34.0)
MCHC: 33.1 g/dL (ref 30.0–36.0)
MCV: 115.1 fL — ABNORMAL HIGH (ref 80.0–100.0)
Platelets: 53 10*3/uL — ABNORMAL LOW (ref 150–400)
RBC: 2.39 MIL/uL — ABNORMAL LOW (ref 4.22–5.81)
RDW: 22.2 % — ABNORMAL HIGH (ref 11.5–15.5)
WBC: 11.9 10*3/uL — ABNORMAL HIGH (ref 4.0–10.5)
nRBC: 2.3 % — ABNORMAL HIGH (ref 0.0–0.2)

## 2021-04-05 LAB — PHOSPHORUS: Phosphorus: 9.3 mg/dL — ABNORMAL HIGH (ref 2.5–4.6)

## 2021-04-05 LAB — POCT I-STAT 7, (LYTES, BLD GAS, ICA,H+H)
Acid-Base Excess: 1 mmol/L (ref 0.0–2.0)
Bicarbonate: 26.9 mmol/L (ref 20.0–28.0)
Calcium, Ion: 1.09 mmol/L — ABNORMAL LOW (ref 1.15–1.40)
HCT: 28 % — ABNORMAL LOW (ref 39.0–52.0)
Hemoglobin: 9.5 g/dL — ABNORMAL LOW (ref 13.0–17.0)
O2 Saturation: 89 %
Patient temperature: 36.8
Potassium: 5.5 mmol/L — ABNORMAL HIGH (ref 3.5–5.1)
Sodium: 148 mmol/L — ABNORMAL HIGH (ref 135–145)
TCO2: 28 mmol/L (ref 22–32)
pCO2 arterial: 48.1 mmHg — ABNORMAL HIGH (ref 32.0–48.0)
pH, Arterial: 7.355 (ref 7.350–7.450)
pO2, Arterial: 59 mmHg — ABNORMAL LOW (ref 83.0–108.0)

## 2021-04-05 LAB — GLUCOSE, CAPILLARY
Glucose-Capillary: 133 mg/dL — ABNORMAL HIGH (ref 70–99)
Glucose-Capillary: 134 mg/dL — ABNORMAL HIGH (ref 70–99)
Glucose-Capillary: 150 mg/dL — ABNORMAL HIGH (ref 70–99)
Glucose-Capillary: 161 mg/dL — ABNORMAL HIGH (ref 70–99)
Glucose-Capillary: 161 mg/dL — ABNORMAL HIGH (ref 70–99)
Glucose-Capillary: 168 mg/dL — ABNORMAL HIGH (ref 70–99)

## 2021-04-05 LAB — BASIC METABOLIC PANEL
Anion gap: 10 (ref 5–15)
BUN: 87 mg/dL — ABNORMAL HIGH (ref 6–20)
CO2: 26 mmol/L (ref 22–32)
Calcium: 8.3 mg/dL — ABNORMAL LOW (ref 8.9–10.3)
Chloride: 111 mmol/L (ref 98–111)
Creatinine, Ser: 2.22 mg/dL — ABNORMAL HIGH (ref 0.61–1.24)
GFR, Estimated: 34 mL/min — ABNORMAL LOW (ref >60)
Glucose, Bld: 181 mg/dL — ABNORMAL HIGH (ref 70–99)
Potassium: 5.7 mmol/L — ABNORMAL HIGH (ref 3.5–5.1)
Sodium: 147 mmol/L — ABNORMAL HIGH (ref 135–145)

## 2021-04-05 LAB — HEPATIC FUNCTION PANEL
ALT: 146 U/L — ABNORMAL HIGH (ref 0–44)
AST: 375 U/L — ABNORMAL HIGH (ref 15–41)
Albumin: 2.2 g/dL — ABNORMAL LOW (ref 3.5–5.0)
Alkaline Phosphatase: 131 U/L — ABNORMAL HIGH (ref 38–126)
Bilirubin, Direct: 21.9 mg/dL — ABNORMAL HIGH (ref 0.0–0.2)
Indirect Bilirubin: 11.5 mg/dL — ABNORMAL HIGH (ref 0.3–0.9)
Total Bilirubin: 33.4 mg/dL (ref 0.3–1.2)
Total Protein: 4.9 g/dL — ABNORMAL LOW (ref 6.5–8.1)

## 2021-04-05 LAB — POTASSIUM
Potassium: 5.7 mmol/L — ABNORMAL HIGH (ref 3.5–5.1)
Potassium: 5.7 mmol/L — ABNORMAL HIGH (ref 3.5–5.1)

## 2021-04-05 LAB — SODIUM: Sodium: 146 mmol/L — ABNORMAL HIGH (ref 135–145)

## 2021-04-05 LAB — MAGNESIUM: Magnesium: 3.3 mg/dL — ABNORMAL HIGH (ref 1.7–2.4)

## 2021-04-05 MED ORDER — INSULIN ASPART 100 UNIT/ML IV SOLN
5.0000 [IU] | Freq: Once | INTRAVENOUS | Status: AC
  Administered 2021-04-05: 5 [IU] via INTRAVENOUS

## 2021-04-05 MED ORDER — SODIUM ZIRCONIUM CYCLOSILICATE 10 G PO PACK
10.0000 g | PACK | Freq: Once | ORAL | Status: AC
  Administered 2021-04-05: 10 g
  Filled 2021-04-05: qty 1

## 2021-04-05 MED ORDER — NOREPINEPHRINE 4 MG/250ML-% IV SOLN
0.0000 ug/min | INTRAVENOUS | Status: DC
  Administered 2021-04-05: 2 ug/min via INTRAVENOUS
  Filled 2021-04-05: qty 250

## 2021-04-05 MED ORDER — DEXTROSE 50 % IV SOLN
25.0000 g | Freq: Once | INTRAVENOUS | Status: AC
  Administered 2021-04-05: 25 g via INTRAVENOUS
  Filled 2021-04-05: qty 50

## 2021-04-05 MED ORDER — DEXTROSE 250 MG/ML IV SOLN
25.0000 g | Freq: Once | INTRAVENOUS | Status: DC
  Filled 2021-04-05: qty 100

## 2021-04-05 MED ORDER — SODIUM CHLORIDE 0.9 % IV SOLN
2.0000 g | Freq: Two times a day (BID) | INTRAVENOUS | Status: DC
  Administered 2021-04-05 (×2): 2 g via INTRAVENOUS
  Filled 2021-04-05 (×2): qty 2

## 2021-04-05 NOTE — Progress Notes (Signed)
PROGRESS NOTE FOR [facility]  Subjective: Pulmonary noted gulp breathing overnight  Objective: Vital signs in last 24 hours: Temp:  [98.2 F (36.8 C)-99.3 F (37.4 C)] 98.4 F (36.9 C) (08/28 0830) Pulse Rate:  [63-82] 72 (08/28 0830) Resp:  [18-36] 23 (08/28 0830) BP: (96-123)/(41-64) 100/46 (08/28 0830) SpO2:  [88 %-97 %] 94 % (08/28 0830) FiO2 (%):  [60 %-100 %] 90 % (08/28 0815) Weight:  [98.4 kg] 98.4 kg (08/28 0442) Last BM Date: 04/04/21  Intake/Output from previous day: 08/27 0701 - 08/28 0700 In: 2593 [I.V.:59.3; NG/GT:2220; IV Piggyback:313.7] Out: 710 [Urine:560; Stool:150] Intake/Output this shift: Total I/O In: 14.6 [I.V.:14.6] Out: 10 [Urine:10]  General appearance: intubated, unresponsive Resp: clear to auscultation in the anterior upper lung fields, upper airway rhonchi Cardio: regular rate and rhythm GI: distended, soft, + ascites from the mid point down Extremities: 4+ edema  Lab Results: Recent Labs    04/03/21 0401 04/03/21 1125 04/03/21 1231 04/04/21 0245 04/05/21 0309 04/05/21 0430  WBC 18.4*  --   --  18.3* 11.9*  --   HGB 10.5*  --    < > 9.3* 9.1* 9.5*  HCT 32.3*  --    < > 25.4* 27.5* 28.0*  PLT 48* 51*  --  50* 53*  --    < > = values in this interval not displayed.   BMET Recent Labs    04/03/21 1742 04/03/21 2226 04/04/21 0245 04/04/21 1143 04/05/21 0011 04/05/21 0309 04/05/21 0430 04/05/21 0522 04/05/21 0753  NA 152*   < > 150*   < > 146* 147* 148*  --   --   K 5.6*  --  4.7  --   --  5.7* 5.5* 5.7* 5.7*  CL 118*  --  116*  --   --  111  --   --   --   CO2 29  --  30  --   --  26  --   --   --   GLUCOSE 273*  --  167*  --   --  181*  --   --   --   BUN 57*  --  57*  --   --  87*  --   --   --   CREATININE 0.87  --  0.97  --   --  2.22*  --   --   --   CALCIUM 8.3*  --  8.2*  --   --  8.3*  --   --   --    < > = values in this interval not displayed.   LFT Recent Labs    04/03/21 1125 04/04/21 0245  PROT 5.5*  4.9*  ALBUMIN 2.6* 2.2*  AST 170* 178*  ALT 118* 115*  ALKPHOS 153* 136*  BILITOT 23.0* 25.0*  BILIDIR 14.6*  --   IBILI 8.4*  --    PT/INR Recent Labs    04/03/21 1125  LABPROT 20.6*  INR 1.8*   Hepatitis Panel No results for input(s): HEPBSAG, HCVAB, HEPAIGM, HEPBIGM in the last 72 hours. C-Diff No results for input(s): CDIFFTOX in the last 72 hours. Fecal Lactopherrin No results for input(s): FECLLACTOFRN in the last 72 hours.  Studies/Results: DG Chest Port 1 View  Result Date: 04/04/2021 CLINICAL DATA:  Acute respiratory failure. EXAM: PORTABLE CHEST 1 VIEW COMPARISON:  03/31/2021 FINDINGS: ETT tip is above the carina. There is a left subclavian central venous catheter with tip projecting over the SVC. There has  been significant interval worsening of aeration to the right mid lung and right lower lobe. Worsening aeration to the left lung base is also noted. Suspect new bilateral pleural effusions. IMPRESSION: 1. Worsening aeration to both lungs, right greater than left. 2. Suspect new bilateral pleural effusions. Electronically Signed   By: Kerby Moors M.D.   On: 04/04/2021 08:27   ECHOCARDIOGRAM LIMITED  Result Date: 04/04/2021    ECHOCARDIOGRAM LIMITED REPORT   Patient Name:   Edwin West Date of Exam: 04/04/2021 Medical Rec #:  [id number]            Height:       69.0 in Accession #:    [id number]           Weight:       207.9 lb Date of Birth:  March 19, 1961            BSA:          2.100 m Patient Age:    60 years             BP:           118/61 mmHg Patient Gender: M                    HR:           84 bpm. Exam Location:  Inpatient Procedure: Limited Echo, Limited Color Doppler and Cardiac Doppler Indications:    murmur  History:        Patient has prior history of Echocardiogram examinations, most                 recent 03/25/2021. Covid. Alcoholic cirrhosis.  Sonographer:    Johny Chess RDCS Referring Phys: [id number] [facility]  Sonographer Comments: Echo  performed with patient supine and on artificial respirator, suboptimal apical window, suboptimal parasternal window, Technically difficult study due to poor echo windows and Technically challenging study due to limited acoustic  windows. Image acquisition challenging due to respiratory motion. IMPRESSIONS  1. Left ventricular ejection fraction, by estimation, is 55 to 60%. The left ventricle has normal function. Left ventricular endocardial border not optimally defined to evaluate regional wall motion. Left ventricular diastolic function could not be evaluated.  2. Right ventricular systolic function was not well visualized. The right ventricular size is normal.  3. The mitral valve is grossly normal. No evidence of mitral valve regurgitation. No evidence of mitral stenosis.  4. The aortic valve is grossly normal. Aortic valve regurgitation is not visualized. No aortic stenosis is present.  5. The inferior vena cava is normal in size with greater than 50% respiratory variability, suggesting right atrial pressure of 3 mmHg.  6. The pericardial effusion is circumferential. Moderate pleural effusion in the right lateral region. FINDINGS  Left Ventricle: Left ventricular ejection fraction, by estimation, is 55 to 60%. The left ventricle has normal function. Left ventricular endocardial border not optimally defined to evaluate regional wall motion. The left ventricular internal cavity size was normal in size. There is no left ventricular hypertrophy. Left ventricular diastolic function could not be evaluated. Right Ventricle: The right ventricular size is normal. No increase in right ventricular wall thickness. Right ventricular systolic function was not well visualized. Left Atrium: Left atrial size was normal in size. Right Atrium: Right atrial size was normal in size. Pericardium: Trivial pericardial effusion is present. The pericardial effusion is circumferential. Mitral Valve: The mitral valve is grossly normal. No  evidence of mitral valve stenosis.  Tricuspid Valve: The tricuspid valve is normal in structure. Tricuspid valve regurgitation is not demonstrated. No evidence of tricuspid stenosis. Aortic Valve: The aortic valve is grossly normal. Aortic valve regurgitation is not visualized. No aortic stenosis is present. Pulmonic Valve: The pulmonic valve was normal in structure. Pulmonic valve regurgitation is not visualized. No evidence of pulmonic stenosis. Aorta: The aortic root is normal in size and structure. Venous: The inferior vena cava is normal in size with greater than 50% respiratory variability, suggesting right atrial pressure of 3 mmHg. IAS/Shunts: The interatrial septum appears to be lipomatous. No atrial level shunt detected by color flow Doppler. Additional Comments: There is a moderate pleural effusion in the right lateral region. Fransico Him MD Electronically signed by Fransico Him MD Signature Date/Time: 04/04/2021/3:22:55 PM    Final     Medications: Scheduled:  budesonide (PULMICORT) nebulizer solution  0.5 mg Nebulization BID   And   arformoterol  15 mcg Nebulization BID   chlorhexidine gluconate (MEDLINE KIT)  15 mL Mouth Rinse BID   Chlorhexidine Gluconate Cloth  6 each Topical Q0600   feeding supplement (PROSource TF)  90 mL Per Tube BID   free water  300 mL Per Tube Q4H   insulin aspart  2-6 Units Subcutaneous Q4H   insulin detemir  14 Units Subcutaneous BID   mouth rinse  15 mL Mouth Rinse 10 times per day   pantoprazole sodium  40 mg Per Tube QHS   sodium chloride flush  10-40 mL Intracatheter Q12H   Continuous:  sodium chloride Stopped (04/07/2021 1241)   ceFEPime (MAXIPIME) IV 2 g (04/05/21 1009)   feeding supplement (VITAL 1.5 CAL) 1,000 mL (04/03/21 2240)   fentaNYL infusion INTRAVENOUS Stopped (04/04/21 0807)   norepinephrine (LEVOPHED) Adult infusion 3 mcg/min (04/05/21 0800)    Assessment/Plan: 1) Hepatic failure. 2) AKI overnight. 3) Respiratory failure. 4) Anemia -  stable.   The patient's renal function acutely worsened.  Review of the vital signs did show some relative hypotension starting 04/05/2021 and levophed was started at 8 AM.  Edwin West is currently not on sedation and Edwin West remains unresponsive.  It is likely that his renal function is as a result of his hypotension.    Plan: 1) Continue supportive care. 2) No new recommendations from the GI standpoint.  Prognosis is poor.  LOS: 12 days   Gleason Ardoin D 04/05/2021, 10:18 AM

## 2021-04-05 NOTE — Progress Notes (Signed)
NAME:  Edwin West, MRN:  [id number], DOB:  1961/04/05, LOS: 12 ADMISSION DATE:  04/05/2021, CONSULTATION DATE:  04/02/2021 REFERRING MD:  Criss Alvine, CHIEF COMPLAINT:  Respiratory Failure/ Shock 2/2 Covid 17   Brief Hx  60 year old male who presented 8/16 with severe ARDS secondary to COVID-19, streptococcal pneumonia, multiorgan failure.   Pertinent  Medical History  Cirrhosis - no recent drinking   Significant Hospital Events: Including procedures, antibiotic start and stop dates in addition to other pertinent events   8/16 Admit with R PNA, COVID +. Urine strep + Pf 87 8/17 Peak 26/ Pplat 21, propofol stopped with elevated triglycerides. BC+ for strep pneumoniae 8/18 proned x 16 hours and remain supine. S/p right radial A thrombectomy 8/19 Initially responded to increased PEEP 8/20 Worsening hypoxemia. Proned 8/21 Amio for afib, weaning pressors 8/22 Remains prone 8/23 P/F ratio improved so proning held. Off pressors, diuresis started 8/24 Free water and d5W for hypernatremia 8/26 Off versed drip, PEEP/Fio2 down to 5/40. Gi consulted for elevated LFTs  Interim History / Subjective:   Remains unresponsive, low urine output.  Given Lasix overnight, Creatinine worse Restarted Levophed  Objective   Blood pressure (!) 100/46, pulse 72, temperature 98.4 F (36.9 C), resp. rate (!) 23, height 5\' 9"  (1.753 m), weight 98.4 kg, SpO2 94 %. CVP:  [4 mmHg-47 mmHg] 13 mmHg  Vent Mode: PRVC FiO2 (%):  [60 %-100 %] 90 % Set Rate:  [34 bmp] 34 bmp Vt Set:  [560 mL] 560 mL PEEP:  [5 cmH20] 5 cmH20 Plateau Pressure:  [20 cmH20-27 cmH20] 22 cmH20   Intake/Output Summary (Last 24 hours) at 04/05/2021 1019 Last data filed at 04/05/2021 0809 Gross per 24 hour  Intake 2584.12 ml  Output 720 ml  Net 1864.12 ml   Filed Weights   04/03/21 0500 04/04/21 0500 04/05/21 0442  Weight: 94.6 kg 94.3 kg 98.4 kg   Physical Exam: Gen:      No acute distress HEENT:  EOMI, jaundiced Neck:      No masses; no thyromegaly, ETT Lungs:    Clear to auscultation bilaterally; normal respiratory effort CV:         Regular rate and rhythm; no murmurs Abd:      Distended abdomen Ext:    No edema; adequate peripheral perfusion Skin:      Warm and dry; no rash Neuro: Sedated, unresponsive  Labs/imaging personally reviewed Sodium 148, potassium elevated to 5.7, creatinine elevated to 2.2  Resolved Hospital Problem list   Hypothermia  Lactic Acidosis  Septic shock  Assessment & Plan:  ARDS secondary to Streptococcal/Pseudomonal PNA + COVID Septic shock present on admission Strep PNA on R with associated bacteremia likely cause of initial decline, additional COVID positive.   Now with new HAP, gram-negative rods in sputum Continue vent support.  Unable to obtain due to altered mental status and new oxygen requirements Follow intermittent chest x-ray Antibiotics broadened to cefepime Monitor CVP. Wean pressors  AKI secondary to ATN Hyperkalemia Creatinine worse today in the setting of new infection, shock Lokelma Monitor  Paroxysmal Atrial Fibrillation  In NSR now  Hepatorenal syndrome Decompensated Cirrhosis 2/2 ?EtOH Lactulose held due to diarrhea Right upper quadrant ultrasound reviewed Bilirubin markedly elevated with transaminitis.   Appreciate input from GI  Acute metabolic encephalopathy secondary critical illness, sedation, uremia Supportive care  Possible Ileus proved on KUB Tube feeds at goal  Right radial thrombus provoked 2/2 line in setting of COVID-19 s/p thrombectomy Hold on anticoagulation due  to thrombocytopenia  Thrombocytopenia in setting of sepsis + known prior ETOH use / cirrhosis; Low-grade DIC Off heparin Monitor labs.  Transfuse for active bleeding or platelets less than 10  Hypernatremia > improving Continue free water  Hyperglycemia SSI coverage  Goals of care Discussed with daughter over telephone on 8/28.  Given poor overall  prognosis we have decided to make him DNR Palliative care consulted to  Best Practice (right click and "Reselect all SmartList Selections" daily)  Diet/type: TF DVT prophylaxis: SCDs.  Holding heparin due to thrombocytopenia GI prophylaxis: PPI Lines: Central line Foley:  yes, still needed  Code Status:  full code Last date of multidisciplinary goals of care discussion: Daughter daily updated by me daily.    The patient is critically ill with multiple organ system failure and requires high complexity decision making for assessment and support, frequent evaluation and titration of therapies, advanced monitoring, review of radiographic studies and interpretation of complex data.   Critical Care Time devoted to patient care services, exclusive of separately billable procedures, described in this note is 45 minutes.   Chilton Greathouse MD [facility] See Amion for pager  If no response to pager , please call [phone] until 7pm After 7:00 pm call Elink  [phone] 04/05/2021, 10:19 AM

## 2021-04-05 NOTE — Consult Note (Signed)
Palliative Medicine Inpatient West Note  Consulting Provider: Marshell Garfinkel, MD  Reason for West:   Edwin West  Reason for West? Goals of care    HPI:  Per intake H&P --> 60 year old male with a PMG of liver cirrhosis who presented 8/16 with severe ARDS secondary to COVID-19, streptococcal pneumonia, multiorgan failure  Palliative care has been asked to aid in further goals of care conversations given patients exceptionally poor prognosis.   Clinical Assessment/Goals of Care:  *Please note that this is a verbal dictation therefore any spelling or grammatical errors are due to the "[employer]" system interpretation.  I have reviewed medical records including EPIC notes, labs and imaging, received report from bedside RN, assessed the patient who is intubated, non-responsive.    I met with patients daughter, Edwin West and son in Sports coach. Patients son was called over speaker-phone to further discuss diagnosis prognosis, GOC, EOL wishes, disposition and options.  We discussed Edwin West's history of cirrhosis and how trial-some his time has been contending with this diagnosis. His family shares that Edwin West had quit drinking and was very reliable in terms of medications to control his disease.   We reviewed Edwin West's acute illnesses. His daughter shares that he was initially improving after being diagnosed with COVID and for a period appeared that he would "get better". She shares that she spoke to Dr. Vaughan Browner this morning and now understand how poor his prognosis is. We reviewed his multiorgan system failure and how each organ system affects another. Edwin West shares that she works at [employer] so she is very in tube with exceptionally ill patients.    I introduced Palliative Medicine as specialized medical care for people living with serious illness. It focuses on providing relief from the symptoms and stress of a serious illness.  The goal is to improve quality of life for both the patient and the family.  A brief life review was completed. Edwin West shares that her father lives with she and her spouse in [place], [place]. He is separate from her mother. He has a son and a daughter as well as three grandchildren. 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 He is a man of faith and practices within the [facility] denomination.   A detailed discussion was had today regarding advanced directives - he has never made these.    Concepts specific to code status, artifical feeding and hydration, continued IV antibiotics and rehospitalization was had. Dr. Vaughan Browner had a detailed conversation with the patients daughter this morning and Khylen was made DNAR. This was confirmed again.  The difference between a aggressive medical intervention path  and a palliative comfort care path for this patient at this time was had. We reviewed that [name]'s present conditions are severe and it is onlay a matter of time before systems start to decompensate further. We discussed the idea of changing our focus to comfort oriented care. We talked about transition to comfort measures in house and what that would entail inclusive of medications to control pain, dyspnea, agitation, nausea, itching, and hiccups.  We discussed stopping all uneccessary measures such as mechanical ventilation, NGT feedings, cardiac monitoring, antibiotics, blood draws, needle sticks, and frequent vital signs.   WE reviewed that presently there are family members planning to visit the patient though once everyone has come to see the patient who would like to cessation of care and moving forward with a compassionate extubation and focusing on comfort.   Discussed the importance  of continued conversation with family and their  medical providers regarding overall plan of care and treatment options, ensuring decisions are within the context of the patients values and GOCs.  Decision  Maker: Edwin West (Daughter) [phone]  SUMMARY OF RECOMMENDATIONS   DNAR  Plan for family the opportunity to visit  Plan for compassionate extubation and transition for comfort measures in the next 24-48 hours  Ongoing PMT support  Code Status/Advance Care Planning: DNAR   Palliative Prophylaxis:  Oral care, turn q2h  Additional Recommendations (Limitations, Scope, Preferences): Continue current scope   Psycho-social/Spiritual:  Desire for further Chaplaincy support: Yes Additional Recommendations: Education on MSOF   Prognosis: Poor overall. Limited hours to days when extubated.  Discharge Planning: Discharge will likely be celestial  Vitals:   04/05/21 1015 04/05/21 1030  BP: (!) 106/52 (!) 98/52  Pulse: 75 73  Resp: (!) 23 (!) 25  Temp: 98.2 F (36.8 C) 98.2 F (36.8 C)  SpO2: 95% 95%    Intake/Output Summary (Last 24 hours) at 04/05/2021 1250 Last data filed at 04/05/2021 1000 Gross per 24 hour  Intake 2636.47 ml  Output 735 ml  Net 1901.47 ml   Last Weight  Most recent update: 04/05/2021  4:42 AM    Weight  98.4 kg (216 lb 14.9 oz)            Gen:  Middle aged M in NAD HEENT: ETT, NGT, drymucous membranes CV: Regular rate and rhythm  PULM: Mechanical ventilator ABD: soft/nontender  EXT: Generalized edema Neuro: Somnolent  PPS: 10%   This conversation/these recommendations were discussed with patient primary care team, Dr. Vaughan Browner  Time In: 1300 Time Out: 1410 Total Time: 70 Greater than 50%  of this time was spent counseling and coordinating care related to the above assessment and plan.  [place] Team Team Cell Phone: [phone] Please utilize secure chat with additional questions, if there is no response within 30 minutes please call the above phone number  Palliative Medicine Team providers are available by phone from 7am to 7pm daily and can be reached through the team cell  phone.  Should this patient require assistance outside of these hours, please call the patient's attending physician.

## 2021-04-05 NOTE — Progress Notes (Signed)
04/05/21 0815  Airway 8 mm  Placement Date/Time: 03/22/2021 1350   Placed By: (c) ICU physician  Laryngoscope Blade: MAC;4  ETT Types: Oral  Size (mm): 8 mm  Cuffed: Cuffed  Insertion attempts: 1  Airway Equipment: Stylet  Placement Confirmation: (c) Direct Visualization;ETCO2 Det...  Secured at (cm) 26 cm  Measured From Lips  Secured Location Right  Secured By Actuary Repositioned Yes  Prone position No  Cuff Pressure (cm H2O) Clear OR 27-39 CmH2O  Adult Ventilator Settings  Vent Type Servo i  Humidity HME  Vent Mode PRVC  Vt Set 560 mL  Set Rate 34 bmp  FiO2 (%) 90 %  I Time 0.7 Sec(s)  PEEP 5 cmH20  Adult Ventilator Measurements  Peak Airway Pressure 25 L/min  Mean Airway Pressure 13 cmH20  Plateau Pressure 22 cmH20  Resp Rate Spontaneous 0 br/min  Resp Rate Total 34 br/min  Exhaled Vt 665 mL  Measured Ve 21.4 mL  I:E Ratio Measured 1:1.5  Auto PEEP 0 cmH20  Total PEEP 5 cmH20  SpO2 95 %  Adult Ventilator Alarms  Alarms On Y  Ve High Alarm 26 L/min  Ve Low Alarm 4 L/min  Resp Rate High Alarm 45 br/min  Resp Rate Low Alarm 10  PEEP Low Alarm 3 cmH2O  Press High Alarm 45 cmH2O  Daily Weaning Assessment  Daily Assessment of Readiness to Wean Wean protocol criteria not met  Breath Sounds  Bilateral Breath Sounds Rhonchi  Airway Suctioning/Secretions  Suction Type ETT  Suction Device  Catheter  Secretion Amount Small  Secretion Color Tan  Secretion Consistency Thick  Suction Tolerance Tolerated well  Suctioning Adverse Effects None

## 2021-04-05 NOTE — Progress Notes (Signed)
Notified E link of decrease UO about 10 ml/hr and decrease BP. Sent AM labs. Will follow up with results and continue to monitor.

## 2021-04-06 ENCOUNTER — Inpatient Hospital Stay (HOSPITAL_COMMUNITY): Payer: Commercial Managed Care - PPO

## 2021-04-06 DIAGNOSIS — N179 Acute kidney failure, unspecified: Secondary | ICD-10-CM | POA: Diagnosis not present

## 2021-04-06 DIAGNOSIS — J9601 Acute respiratory failure with hypoxia: Secondary | ICD-10-CM | POA: Diagnosis not present

## 2021-04-06 DIAGNOSIS — R6521 Severe sepsis with septic shock: Secondary | ICD-10-CM

## 2021-04-06 DIAGNOSIS — A419 Sepsis, unspecified organism: Secondary | ICD-10-CM | POA: Diagnosis not present

## 2021-04-06 DIAGNOSIS — U071 COVID-19: Secondary | ICD-10-CM | POA: Diagnosis not present

## 2021-04-06 LAB — COMPREHENSIVE METABOLIC PANEL
ALT: 327 U/L — ABNORMAL HIGH (ref 0–44)
AST: 1028 U/L — ABNORMAL HIGH (ref 15–41)
Albumin: 2 g/dL — ABNORMAL LOW (ref 3.5–5.0)
Alkaline Phosphatase: 146 U/L — ABNORMAL HIGH (ref 38–126)
Anion gap: 11 (ref 5–15)
BUN: 119 mg/dL — ABNORMAL HIGH (ref 6–20)
CO2: 23 mmol/L (ref 22–32)
Calcium: 7.8 mg/dL — ABNORMAL LOW (ref 8.9–10.3)
Chloride: 107 mmol/L (ref 98–111)
Creatinine, Ser: 3.8 mg/dL — ABNORMAL HIGH (ref 0.61–1.24)
GFR, Estimated: 18 mL/min — ABNORMAL LOW (ref >60)
Glucose, Bld: 156 mg/dL — ABNORMAL HIGH (ref 70–99)
Potassium: 6.5 mmol/L (ref 3.5–5.1)
Sodium: 141 mmol/L (ref 135–145)
Total Bilirubin: 34.9 mg/dL (ref 0.3–1.2)
Total Protein: 4.7 g/dL — ABNORMAL LOW (ref 6.5–8.1)

## 2021-04-06 LAB — BASIC METABOLIC PANEL
Anion gap: 11 (ref 5–15)
BUN: 118 mg/dL — ABNORMAL HIGH (ref 6–20)
CO2: 23 mmol/L (ref 22–32)
Calcium: 7.6 mg/dL — ABNORMAL LOW (ref 8.9–10.3)
Chloride: 108 mmol/L (ref 98–111)
Creatinine, Ser: 3.96 mg/dL — ABNORMAL HIGH (ref 0.61–1.24)
GFR, Estimated: 17 mL/min — ABNORMAL LOW (ref >60)
Glucose, Bld: 136 mg/dL — ABNORMAL HIGH (ref 70–99)
Potassium: 6.9 mmol/L (ref 3.5–5.1)
Sodium: 142 mmol/L (ref 135–145)

## 2021-04-06 LAB — CBC
HCT: 27.1 % — ABNORMAL LOW (ref 39.0–52.0)
Hemoglobin: 9 g/dL — ABNORMAL LOW (ref 13.0–17.0)
MCH: 38.6 pg — ABNORMAL HIGH (ref 26.0–34.0)
MCHC: 33.2 g/dL (ref 30.0–36.0)
MCV: 116.3 fL — ABNORMAL HIGH (ref 80.0–100.0)
Platelets: 92 10*3/uL — ABNORMAL LOW (ref 150–400)
RBC: 2.33 MIL/uL — ABNORMAL LOW (ref 4.22–5.81)
RDW: 23.4 % — ABNORMAL HIGH (ref 11.5–15.5)
WBC: 14.4 10*3/uL — ABNORMAL HIGH (ref 4.0–10.5)
nRBC: 14.3 % — ABNORMAL HIGH (ref 0.0–0.2)

## 2021-04-06 LAB — PHOSPHORUS: Phosphorus: >30 mg/dL — ABNORMAL HIGH (ref 2.5–4.6)

## 2021-04-06 LAB — CULTURE, RESPIRATORY W GRAM STAIN

## 2021-04-06 LAB — GLUCOSE, CAPILLARY: Glucose-Capillary: 129 mg/dL — ABNORMAL HIGH (ref 70–99)

## 2021-04-06 LAB — MAGNESIUM: Magnesium: 3.5 mg/dL — ABNORMAL HIGH (ref 1.7–2.4)

## 2021-04-06 MED ORDER — SODIUM BICARBONATE 8.4 % IV SOLN: 50.0000 meq | Freq: Once | INTRAVENOUS | Status: AC

## 2021-04-06 MED ORDER — SODIUM ZIRCONIUM CYCLOSILICATE 10 G PO PACK
10.0000 g | PACK | Freq: Once | ORAL | Status: AC
  Administered 2021-04-06: 10 g
  Filled 2021-04-06: qty 1

## 2021-04-06 MED ORDER — DEXTROSE 50 % IV SOLN: 50.0000 mL | Freq: Once | INTRAVENOUS | Status: AC

## 2021-04-06 MED ORDER — GLYCOPYRROLATE 0.2 MG/ML IJ SOLN: 0.2000 mg | INTRAMUSCULAR | Status: DC | PRN

## 2021-04-06 MED ORDER — VASOPRESSIN 20 UNITS/100 ML INFUSION FOR SHOCK
0.0000 [IU]/min | INTRAVENOUS | Status: DC
  Administered 2021-04-06: 0.03 [IU]/min via INTRAVENOUS
  Filled 2021-04-06: qty 100

## 2021-04-06 MED ORDER — HYDROCORTISONE NA SUCCINATE PF 100 MG IJ SOLR: 100.0000 mg | Freq: Three times a day (TID) | INTRAMUSCULAR | Status: DC

## 2021-04-06 MED ORDER — MORPHINE SULFATE (PF) 2 MG/ML IV SOLN: 1.0000 mg | INTRAVENOUS | Status: DC | PRN

## 2021-04-06 MED ORDER — DIPHENHYDRAMINE HCL 50 MG/ML IJ SOLN: 25.0000 mg | INTRAMUSCULAR | Status: DC | PRN

## 2021-04-06 MED ORDER — POLYVINYL ALCOHOL 1.4 % OP SOLN
1.0000 [drp] | Freq: Four times a day (QID) | OPHTHALMIC | Status: DC | PRN
  Filled 2021-04-06: qty 15

## 2021-04-06 MED ORDER — NOREPINEPHRINE 16 MG/250ML-% IV SOLN
0.0000 ug/min | INTRAVENOUS | Status: DC
Start: 2021-04-06 — End: 2021-04-06
  Administered 2021-04-06: 18 ug/min via INTRAVENOUS
  Filled 2021-04-06: qty 250

## 2021-04-06 MED ORDER — ATROPINE SULFATE 1 MG/10ML IJ SOSY
PREFILLED_SYRINGE | INTRAMUSCULAR | Status: AC
  Filled 2021-04-06: qty 10

## 2021-04-06 MED ORDER — CALCIUM GLUCONATE-NACL 1-0.675 GM/50ML-% IV SOLN
1.0000 g | Freq: Once | INTRAVENOUS | Status: DC
  Filled 2021-04-06: qty 50

## 2021-04-06 MED ORDER — DEXTROSE 50 % IV SOLN
INTRAVENOUS | Status: AC
  Administered 2021-04-06: 50 mL via INTRAVENOUS
  Filled 2021-04-06: qty 50

## 2021-04-06 MED ORDER — DEXTROSE 5 % IV SOLN: INTRAVENOUS | Status: DC

## 2021-04-06 MED ORDER — GLYCOPYRROLATE 1 MG PO TABS: 1.0000 mg | ORAL_TABLET | ORAL | Status: DC | PRN

## 2021-04-06 MED ORDER — DEXTROSE 50 % IV SOLN
1.0000 | Freq: Once | INTRAVENOUS | Status: AC
  Administered 2021-04-06: 50 mL via INTRAVENOUS
  Filled 2021-04-06: qty 50

## 2021-04-06 MED ORDER — INSULIN ASPART 100 UNIT/ML IV SOLN
5.0000 [IU] | Freq: Once | INTRAVENOUS | Status: AC
  Administered 2021-04-06: 5 [IU] via INTRAVENOUS

## 2021-04-06 MED ORDER — INSULIN ASPART 100 UNIT/ML IV SOLN
10.0000 [IU] | Freq: Once | INTRAVENOUS | Status: AC
  Administered 2021-04-06: 10 [IU] via INTRAVENOUS

## 2021-04-06 MED ORDER — ALBUTEROL SULFATE (2.5 MG/3ML) 0.083% IN NEBU
10.0000 mg | INHALATION_SOLUTION | Freq: Once | RESPIRATORY_TRACT | Status: AC
  Administered 2021-04-06: 10 mg via RESPIRATORY_TRACT
  Filled 2021-04-06: qty 12

## 2021-04-06 MED ORDER — SODIUM BICARBONATE 8.4 % IV SOLN
INTRAVENOUS | Status: AC
  Administered 2021-04-06: 50 meq via INTRAVENOUS
  Filled 2021-04-06: qty 50

## 2021-04-07 LAB — PATHOLOGIST SMEAR REVIEW: Path Review: INCREASED

## 2021-04-09 NOTE — Progress Notes (Signed)
   Palliative Medicine Inpatient Follow Up Note  Chart reviewed this morning. Given patients decreased stability the CCM team called patients family to bedside. Patients family determined the path of comfort mediated care and per secure chat with Canary Brim, NP, he passed shortly thereafter.  The Palliative care team will remain available if any additional support needs should arise.  No Charge ______________________________________________________________________________________ Edwin West  Palliative Medicine Team Team Cell Phone: [phone] Please utilize secure chat with additional questions, if there is no response within 30 minutes please call the above phone number  Palliative Medicine Team providers are available by phone from 7am to 7pm daily and can be reached through the team cell phone.  Should this patient require assistance outside of these hours, please call the patient's attending physician.

## 2021-04-09 NOTE — Progress Notes (Addendum)
eLink Physician-Brief Progress Note Patient Name: Layth Cerezo DOB: [birth year] MRN: [id number]   Date of Service  April 13, 2021  HPI/Events of Note  please raise ceiling on levophed, trying to keep pt with [place] til tom,   W/drawl Mon or Tue    at now   Discussed with RN. 60 year old male with a PMG of liver cirrhosis who presented 8/16 with severe ARDS secondary to COVID-19, streptococcal pneumonia, multiorgan failure   Palliative care has been asked to aid in further goals of care conversations given patients exceptionally poor prognosis.   eICU Interventions  Vasopressin gtt ordered. DNR.      Intervention Category Intermediate Interventions: Hypotension - evaluation and management  Ranee Gosselin Apr 13, 2021, 1:48 AM  6:36 AM K 6.5, on Vent. CBG 156. Has a C line.  Discussed with RN.  - hyperkalemia Rx for now.

## 2021-04-09 NOTE — Progress Notes (Signed)
On initial assessment pt bradycardic in the 30s-40s with BP 60s/40s. Increased levophed to max dose 40. Attempted chemical dialysis with dextrose, IV insulin, and bicarb x2 to support life until family able to come to bedside. Family at bedside decided to initiate comfort measures, but pt expired on the vent before they could be implemented. TOD [id number], pronounced by 2 RNs. Emotional support provided by RN to family at bedside. Attending notified of TOD.

## 2021-04-09 NOTE — Death Summary Note (Signed)
DEATH SUMMARY   Patient Details  Name: Edwin West MRN: [id number] DOB: 01-Mar-1961  Admission/Discharge Information   Admit Date:  04/15/2021  Date of Death: Date of Death: April 28, 2021  Time of Death: Time of Death: 0924  Length of Stay: January 11, 2023  Referring Physician: Lynnda Child, MD   Reason(s) for Hospitalization  Severe ARDS from covid-19 pneumonia Septic shock due to streptococcus pneumonia bacteremia  Septic shock due pseudomonas ventilator associated pneumonia  Diagnoses  Preliminary cause of death:  Secondary Diagnoses (including complications and co-morbidities):  Active Problems:   Encounter for central line placement   Pneumonia due to COVID-19 virus   Acute respiratory failure with hypoxia (HCC)   Pressure injury of skin   Acute kidney injury (HCC)   Septic shock [facility])   Brief Hospital Course (including significant findings, care, treatment, and services provided and events leading to death)  Edwin West is a 60 y.o. year old male with cirrhosis who was admitted 04-15-21 for severe ARDS due to covid-19 pneumonia, septic shock due to strep pneumonia bacteremia with course complicated by acute right radial arterial thrombus s/p thrombectomy, septic shock due pseudomonas ventilator associated pneumonia and multiorgan failure. Initially responded well to salvage therapies for ARDS, treatment of severe covid-19 and antibiotics for strep bacteremia with minimal vent settings and came off of vasopressors however on 8/27 developed septic shock due to pseudomonas VAP. AFter discussion with patient's daughter on 8/28 regarding the patient's poor overall prognosis with limited likelihood of neurologically meaningful recovery he was made DNR and family was hopeful to make to the ICU to visit before he passed. On 04/29/23 developed worsening shock despite aggressive supportive measures. After discussion with pt's daughter, attempt was made to transition to comfort measures after  arrival of his son however but the patient died before transition to comfort measures, pronounced dead at 9:24am.     Pertinent Labs and Studies  Significant Diagnostic Studies US RENAL  Result Date: 03/25/2021 CLINICAL DATA:  Annular E a EXAM: RENAL / URINARY TRACT ULTRASOUND COMPLETE COMPARISON:  None. FINDINGS: Right Kidney: Renal measurements: 9.4 x 5.1 x 4.6 cm = volume: 116.4 mL. Echogenicity within normal limits. No mass or hydronephrosis visualized. Left Kidney: Renal measurements: 11.7 x 6 x 4 cm = volume: 144.4 mL. Echogenicity within normal limits. No mass or hydronephrosis visualized. Bladder: Visualized and probably empty Other: None. IMPRESSION: Normal renal ultrasound Electronically Signed   By: Jasmine Pang M.D.   On: 03/25/2021 00:01   DG Chest Port 1 View  Result Date: 04-28-21 CLINICAL DATA:  Shortness of breath, COVID-19 positive. EXAM: PORTABLE CHEST 1 VIEW COMPARISON:  April 04, 2021. FINDINGS: Stable cardiomediastinal silhouette. Endotracheal and feeding tubes are unchanged in position. Stable bilateral lung opacities are noted, right greater than left, consistent with pneumonia. Bony thorax is unremarkable. IMPRESSION: Stable support apparatus.  Stable bilateral lung opacities. Electronically Signed   By: Lupita Raider M.D.   On: Apr 28, 2021 08:03   DG Chest Port 1 View  Result Date: 04/04/2021 CLINICAL DATA:  Acute respiratory failure. EXAM: PORTABLE CHEST 1 VIEW COMPARISON:  03/31/2021 FINDINGS: ETT tip is above the carina. There is a left subclavian central venous catheter with tip projecting over the SVC. There has been significant interval worsening of aeration to the right mid lung and right lower lobe. Worsening aeration to the left lung base is also noted. Suspect new bilateral pleural effusions. IMPRESSION: 1. Worsening aeration to both lungs, right greater than left. 2. Suspect new  bilateral pleural effusions. Electronically Signed   By: Signa Kell M.D.    On: 04/04/2021 08:27   DG Chest Port 1 View  Result Date: 03/31/2021 CLINICAL DATA:  Minimal enters more.  Acute respiratory failure. EXAM: PORTABLE CHEST 1 VIEW COMPARISON:  03/28/2021. FINDINGS: Tracheal digit 5-6 cm above the carina. Nasogastric tube in soft feeding tube enters the abdomen. Left subclavian central line tip in SVC 4 cm above the right atrium. The improvement in bilateral pulmonary infiltrates, common less dense. Better aeration of the lung bases. No worsening or new finding. IMPRESSION: Pattern of improving bilateral pulmonary infiltrates. No worsening or new finding. Lines and tubes appear satisfactory. Electronically Signed   By: Paulina Fusi M.D.   On: 03/31/2021 07:28   DG CHEST PORT 1 VIEW  Result Date: 03/28/2021 CLINICAL DATA:  Hypoxia. EXAM: PORTABLE CHEST 1 VIEW COMPARISON:  Chest radiograph dated 03/25/2021. FINDINGS: Endotracheal tube with tip approximately 7 cm above the carina. Enteric tube extends below the diaphragm with tip beyond the inferior margin of the image. Left subclavian central venous line with tip over central SVC. Interval progression of bilateral pulmonary opacities most consistent with multilobar pneumonia. No large pleural effusion. No pneumothorax. Stable cardiac silhouette. No acute osseous pathology. IMPRESSION: Interval progression of bilateral pulmonary opacities most consistent with worsening multilobar pneumonia. Clinical correlation and follow-up to resolution recommended. Electronically Signed   By: Elgie Collard M.D.   On: 03/28/2021 03:52   DG CHEST PORT 1 VIEW  Result Date: 04/07/2021 CLINICAL DATA:  Acute respiratory failure with hypoxia in a 60 year old male. EXAM: PORTABLE CHEST 1 VIEW COMPARISON:  March 25, 2021. FINDINGS: Endotracheal tube terminating between clavicular heads approximately 6.5 cm above the level of the carina. LEFT-sided central venous access device entering via subclavian approach terminating in the mid SVC area. A  feeding tube and gastric tube are in place, feeding tube appears to have been passed since the recent chest x-ray, tip off the field of the radiograph. EKG leads projecting over the patient's chest. Cardiomediastinal contours are stable. Hilar structures partially obscured on the RIGHT due to RIGHT mid chest airspace disease with confluent airspace process in this location. Patchy opacities again seen in the LEFT chest. No signs of effusion. On limited assessment no acute skeletal process. IMPRESSION: 1. Confluent airspace disease in the RIGHT mid chest and patchy opacities in the LEFT chest are unchanged. This remains suspicious for multifocal pneumonia. 2. Interval placement of feeding tube also with presence of gastric tube, tips of these tubes not visualized. Electronically Signed   By: Donzetta Kohut M.D.   On: 03/09/2021 08:32   DG Chest Port 1 View  Result Date: 03/25/2021 CLINICAL DATA:  Respiratory failure, shortness of breath, COVID positive EXAM: PORTABLE CHEST 1 VIEW COMPARISON:  Chest radiograph 1 day prior FINDINGS: The endotracheal tube tip terminates in the midthoracic trachea approximately 5.5 cm from the carina. The left subclavian central venous catheter tip terminates in the mid SVC, unchanged. The enteric catheter tip is off the field of view. The cardiomediastinal silhouette is stable. Confluent opacity in the right midlung has worsened in the interim. Patchy opacities in the left lung and base are similar. There is no significant pleural effusion. There is no pneumothorax. IMPRESSION: 1. Worsening opacity in the right midlung. No significant interval change in patchy opacities in the left lung. 2. Support devices as above, unchanged. Electronically Signed   By: Lesia Hausen M.D.   On: 03/25/2021 08:27   DG Chest  Port 1 View  Result Date: 03/10/2021 CLINICAL DATA:  Central line placement EXAM: PORTABLE CHEST 1 VIEW COMPARISON:  04/07/2021 at 0802 hours FINDINGS: Interval placement of a  left subclavian approach central venous catheter with distal tip terminating at the level of the mid SVC. Interval placement of a endotracheal tube which terminates approximately 4.2 cm above the carina. New enteric tube courses below the diaphragm with distal tip beyond the inferior margin of the film. Stable heart size. Dense airspace opacities within the mid lung fields, right worse than left. No pneumothorax. IMPRESSION: 1. Interval placement of a left subclavian approach central venous catheter with distal tip terminating at the level of the mid SVC. No pneumothorax. 2. ET tube terminates 4.2 cm above the carina. Electronically Signed   By: Duanne Guess D.O.   On: 03/12/2021 15:12   DG Chest Port 1 View  Result Date: 03/23/2021 CLINICAL DATA:  Over, hypoxia EXAM: PORTABLE CHEST 1 VIEW COMPARISON:  Chest radiograph 04/03/2018 FINDINGS: The cardiomediastinal silhouette is within normal limits. There is extensive confluent opacity projecting over the right midlung and patchy opacities project over the left midlung. There is no significant pleural effusion. There is no pneumothorax. There is no acute osseous abnormality. IMPRESSION: Extensive confluent opacity throughout the right mid lung and patchy opacities in the left lung concerning for COVID pneumonitis. Recommend follow-up radiographs to resolution. Electronically Signed   By: Lesia Hausen M.D.   On: 03/10/2021 08:17   DG Abd Portable 1V  Result Date: 03/25/2021 CLINICAL DATA:  Feeding tube placement EXAM: PORTABLE ABDOMEN - 1 VIEW COMPARISON:  X-ray dated March 24, 2021 FINDINGS: Partially visualized abdomen demonstrates decreased number of distended bowel loops. NG tube tip and side port project over the expected area of the stomach. Interval placement of feeding tube with tip projecting over the expected area of the proximal duodenum. Visualized lung bases demonstrate right lung consolidation, better evaluated on same day chest x-ray.  IMPRESSION: Interval placement of feeding tube with tip projecting over the expected area of the proximal duodenum. Electronically Signed   By: Allegra Lai M.D.   On: 03/25/2021 12:20   DG Abd Portable 1V  Result Date: 03/25/2021 CLINICAL DATA:  Orogastric tube placement in a 60 year old male. EXAM: PORTABLE ABDOMEN - 1 VIEW COMPARISON:  Chest x-ray of March 24, 2021. FINDINGS: Lung bases with interstitial and airspace opacities in near confluent disease at the LEFT lung base with similar appearance to the recent chest x-ray. EKG leads projecting over lower chest and upper abdomen. Gastric tube, tip in the stomach. Side port of the gastric tube is below the GE junction. Bowel distension throughout the abdomen, difficult to discern whether there is a small or large bowel or both large and small bowel with moderate gaseous distension. IMPRESSION: 1. Gastric tube, tip in the stomach, side port below GE junction. 2. Bowel distension throughout the abdomen, difficult to discern whether there is a small or large bowel or both large bowel with moderate gaseous distension. Findings raising the question of ileus. Dedicated abdominal radiograph to include the pelvis may be helpful for further assessment. 3. Parenchymal findings in the chest compatible with pneumonitis better demonstrated on recent chest radiograph. Electronically Signed   By: Donzetta Kohut M.D.   On: 04/02/2021 15:14   VAS [place] UPPER EXTREMITY ARTERIAL DUPLEX  Result Date: 04/04/2021  UPPER EXTREMITY DUPLEX STUDY Patient Name:  Jaymien YESENIA FONTENETTE  Date of Exam:   03/27/2021 Medical Rec #: [id number]  Accession #:    [id number] Date of Birth: [birth year]             Patient Gender: M Patient Age:   68 years Exam Location:  [facility] Procedure:      VAS [place] UPPER EXTREMITY ARTERIAL DUPLEX Referring Phys: Levon Hedger --------------------------------------------------------------------------------  Indications: Ischemic right hand.  History:     Patient has a history of COVID, strep pneumonia.  Comparison Study: No prior study Performing Technologist: Gertie Fey MHA, RDMS, RVT, RDCS  Examination Guidelines: A complete evaluation includes B-mode imaging, spectral Doppler, color Doppler, and power Doppler as needed of all accessible portions of each vessel. Bilateral testing is considered an integral part of a complete examination. Limited examinations for reoccurring indications may be performed as noted.  Right Doppler Findings: +--------------+---------+----------------------------------+--------+--------+ Site          PSV      Waveform                          StenosisComments               (cm/s)                                                      +--------------+---------+----------------------------------+--------+--------+ Subclavian    103      triphasic                                          Prox                                                                      +--------------+---------+----------------------------------+--------+--------+ Axillary      91       triphasic                                          +--------------+---------+----------------------------------+--------+--------+ Brachial Prox 68       triphasic                                          +--------------+---------+----------------------------------+--------+--------+ Brachial Dist 100      triphasic                                          +--------------+---------+----------------------------------+--------+--------+ Radial Mid    25       triphasic; decreased deceleration                                         time                                               +--------------+---------+----------------------------------+--------+--------+  Radial Dist                                              occluded          +--------------+---------+----------------------------------+--------+--------+ Ulnar Prox    117      triphasic                                          +--------------+---------+----------------------------------+--------+--------+ Ulnar Mid     95       triphasic                                          +--------------+---------+----------------------------------+--------+--------+ Ulnar Dist    79       triphasic                                          +--------------+---------+----------------------------------+--------+--------+ Palmar Arch                                              occluded         +--------------+---------+----------------------------------+--------+--------+    Summary:  Right: Total occlusion visualized in the right radial artery and        palmar arch. *See table(s) above for measurements and observations. Electronically signed by Lemar Livings MD on 03/16/2021 at 1:34:24 PM.    Final    ECHOCARDIOGRAM LIMITED  Result Date: 04/04/2021    ECHOCARDIOGRAM LIMITED REPORT   Patient Name:   Rush Karis Juba Date of Exam: 04/04/2021 Medical Rec #:  [id number]            Height:       69.0 in Accession #:    [id number]           Weight:       207.9 lb Date of Birth:  July 19, 1961            BSA:          2.100 m Patient Age:    60 years             BP:           118/61 mmHg Patient Gender: M                    HR:           84 bpm. Exam Location:  Inpatient Procedure: Limited Echo, Limited Color Doppler and Cardiac Doppler Indications:    murmur  History:        Patient has prior history of Echocardiogram examinations, most                 recent 03/25/2021. Covid. Alcoholic cirrhosis.  Sonographer:    Delcie Roch RDCS Referring Phys: [id number] [facility]  Sonographer Comments: Echo performed with patient supine and on artificial respirator, suboptimal apical window, suboptimal parasternal window, Technically difficult study due to poor echo windows and  Technically challenging study due  to limited acoustic  windows. Image acquisition challenging due to respiratory motion. IMPRESSIONS  1. Left ventricular ejection fraction, by estimation, is 55 to 60%. The left ventricle has normal function. Left ventricular endocardial border not optimally defined to evaluate regional wall motion. Left ventricular diastolic function could not be evaluated.  2. Right ventricular systolic function was not well visualized. The right ventricular size is normal.  3. The mitral valve is grossly normal. No evidence of mitral valve regurgitation. No evidence of mitral stenosis.  4. The aortic valve is grossly normal. Aortic valve regurgitation is not visualized. No aortic stenosis is present.  5. The inferior vena cava is normal in size with greater than 50% respiratory variability, suggesting right atrial pressure of 3 mmHg.  6. The pericardial effusion is circumferential. Moderate pleural effusion in the right lateral region. FINDINGS  Left Ventricle: Left ventricular ejection fraction, by estimation, is 55 to 60%. The left ventricle has normal function. Left ventricular endocardial border not optimally defined to evaluate regional wall motion. The left ventricular internal cavity size was normal in size. There is no left ventricular hypertrophy. Left ventricular diastolic function could not be evaluated. Right Ventricle: The right ventricular size is normal. No increase in right ventricular wall thickness. Right ventricular systolic function was not well visualized. Left Atrium: Left atrial size was normal in size. Right Atrium: Right atrial size was normal in size. Pericardium: Trivial pericardial effusion is present. The pericardial effusion is circumferential. Mitral Valve: The mitral valve is grossly normal. No evidence of mitral valve stenosis. Tricuspid Valve: The tricuspid valve is normal in structure. Tricuspid valve regurgitation is not demonstrated. No evidence of tricuspid  stenosis. Aortic Valve: The aortic valve is grossly normal. Aortic valve regurgitation is not visualized. No aortic stenosis is present. Pulmonic Valve: The pulmonic valve was normal in structure. Pulmonic valve regurgitation is not visualized. No evidence of pulmonic stenosis. Aorta: The aortic root is normal in size and structure. Venous: The inferior vena cava is normal in size with greater than 50% respiratory variability, suggesting right atrial pressure of 3 mmHg. IAS/Shunts: The interatrial septum appears to be lipomatous. No atrial level shunt detected by color flow Doppler. Additional Comments: There is a moderate pleural effusion in the right lateral region. Armanda Magic MD Electronically signed by Armanda Magic MD Signature Date/Time: 04/04/2021/3:22:55 PM    Final    ECHOCARDIOGRAM LIMITED  Result Date: 03/25/2021    ECHOCARDIOGRAM LIMITED REPORT   Patient Name:   Carlo Karis Juba Date of Exam: 03/25/2021 Medical Rec #:  [id number]            Height:       69.0 in Accession #:    [id number]           Weight:       199.5 lb Date of Birth:  May 07, 1961            BSA:          2.064 m Patient Age:    60 years             BP:           119/41 mmHg Patient Gender: M                    HR:           75 bpm. Exam Location:  Inpatient Procedure: Cardiac Doppler, Color Doppler and Limited Echo Indications:    I48.91* Unspeicified atrial fibrillation  History:  Patient has no prior history of Echocardiogram examinations.                 Abnormal ECG, Arrythmias:Atrial Fibrillation; Risk                 Factors:Current Smoker. Covid positive. ETOH. Hypoxia.  Sonographer:    Sheralyn Boatman RDCS Referring Phys: [id number] [place]  Sonographer Comments: Technically difficult study due to poor echo windows and echo performed with patient supine and on artificial respirator. Supine, on vent. IMPRESSIONS  1. Left ventricular ejection fraction, by estimation, is 55 to 60%. The left ventricle has normal function. Left  ventricular endocardial border not optimally defined to evaluate regional wall motion. Left ventricular diastolic function could not be evaluated.  2. Right ventricular systolic function is normal. The right ventricular size is normal. There is normal pulmonary artery systolic pressure.  3. The mitral valve is normal in structure. Trivial mitral valve regurgitation. No evidence of mitral stenosis.  4. The aortic valve is tricuspid. Aortic valve regurgitation is not visualized. No aortic stenosis is present.  5. The inferior vena cava is dilated in size with <50% respiratory variability, suggesting right atrial pressure of 15 mmHg. FINDINGS  Left Ventricle: Left ventricular ejection fraction, by estimation, is 55 to 60%. The left ventricle has normal function. Left ventricular endocardial border not optimally defined to evaluate regional wall motion. The left ventricular internal cavity size was normal in size. There is no left ventricular hypertrophy. Left ventricular diastolic function could not be evaluated. Right Ventricle: The right ventricular size is normal. No increase in right ventricular wall thickness. Right ventricular systolic function is normal. There is normal pulmonary artery systolic pressure. The tricuspid regurgitant velocity is 2.20 m/s, and  with an assumed right atrial pressure of 15 mmHg, the estimated right ventricular systolic pressure is 34.4 mmHg. Left Atrium: Left atrial size was normal in size. Right Atrium: Right atrial size was normal in size. Pericardium: There is no evidence of pericardial effusion. Mitral Valve: The mitral valve is normal in structure. Trivial mitral valve regurgitation. No evidence of mitral valve stenosis. Tricuspid Valve: The tricuspid valve is normal in structure. Tricuspid valve regurgitation is trivial. No evidence of tricuspid stenosis. Aortic Valve: The aortic valve is tricuspid. Aortic valve regurgitation is not visualized. No aortic stenosis is present.  Pulmonic Valve: The pulmonic valve was grossly normal. Aorta: The aortic root is normal in size and structure. Venous: The inferior vena cava is dilated in size with less than 50% respiratory variability, suggesting right atrial pressure of 15 mmHg. LEFT VENTRICLE PLAX 2D LVIDd:         4.90 cm LVIDs:         3.20 cm LV PW:         0.90 cm LV IVS:        0.80 cm LVOT diam:     2.50 cm LVOT Area:     4.91 cm  LV Volumes (MOD) LV vol d, MOD A2C: 81.4 ml LV vol d, MOD A4C: 83.8 ml LV vol s, MOD A2C: 38.1 ml LV vol s, MOD A4C: 37.0 ml LV SV MOD A2C:     43.3 ml LV SV MOD A4C:     83.8 ml LV SV MOD BP:      46.5 ml IVC IVC diam: 2.20 cm LEFT ATRIUM         Index LA diam:    3.50 cm 1.70 cm/m   AORTA Ao Root diam:  3.30 cm MITRAL VALVE               TRICUSPID VALVE MV Area (PHT): 4.60 cm    TR Peak grad:   19.4 mmHg MV Decel Time: 165 msec    TR Vmax:        220.00 cm/s MV E velocity: 91.20 cm/s MV A velocity: 73.40 cm/s  SHUNTS MV E/A ratio:  1.24        Systemic Diam: 2.50 cm Chilton Si MD Electronically signed by Chilton Si MD Signature Date/Time: 03/25/2021/2:48:20 PM    Final    US Abdomen Limited RUQ (LIVER/GB)  Result Date: 04/01/2021 CLINICAL DATA:  Elevated LFTs.  History of cirrhosis. EXAM: ULTRASOUND ABDOMEN LIMITED RIGHT UPPER QUADRANT COMPARISON:  Abdominal ultrasound 04/03/2018 FINDINGS: Gallbladder: Partially distended. No gallstones. Borderline wall thickness of 3 mm, likely secondary to chronic liver disease. No sonographic Murphy sign noted by sonographer. Common bile duct: Not clearly delineated on the current exam. Liver: Heterogeneous hepatic parenchymal echogenicity. Nodular capsular contours. No obvious focal lesion. Portal vein was not clearly delineated on the current exam. Other: Small volume right upper quadrant ascites. Technically challenging and limited exam due to intubated patient. IMPRESSION: 1. Hepatic cirrhosis.  Small volume ascites. 2. Technical limited evaluation due  to patient's medical condition. Common bile duct and portal vein are not well delineated on the current exam. 3. Partially distended gallbladder. Borderline wall thickness of 3 mm is likely due to chronic liver disease. No gallstones. Electronically Signed   By: Narda Rutherford M.D.   On: 04/01/2021 19:30    Microbiology Recent Results (from the past 240 hour(s))  MRSA Next Gen by PCR, Nasal     Status: None   Collection Time: 04/04/21  8:17 AM   Specimen: Nasal Mucosa; Nasal Swab  Result Value Ref Range Status   MRSA by PCR Next Gen NOT DETECTED NOT DETECTED Final    Comment: (NOTE) The GeneXpert MRSA Assay (FDA approved for NASAL specimens only), is one component of a comprehensive MRSA colonization surveillance program. It is not intended to diagnose MRSA infection nor to guide or monitor treatment for MRSA infections. Test performance is not FDA approved in patients less than 29 years old. Performed at [facility], [address] [place], [place] [address]   Culture, Respiratory w Gram Stain     Status: None   Collection Time: 04/04/21  9:03 AM   Specimen: Tracheal Aspirate; Respiratory  Result Value Ref Range Status   Specimen Description TRACHEAL ASPIRATE  Final   Special Requests NONE  Final   Gram Stain   Final    ABUNDANT WBC PRESENT, PREDOMINANTLY PMN ABUNDANT GRAM NEGATIVE RODS Performed at [facility], [address] [place], [place] [address]    Culture ABUNDANT PSEUDOMONAS AERUGINOSA  Final   Report Status 03/21/2021 FINAL  Final   Organism ID, Bacteria PSEUDOMONAS AERUGINOSA  Final      Susceptibility   Pseudomonas aeruginosa - MIC*    CEFTAZIDIME 4 SENSITIVE Sensitive     CIPROFLOXACIN <=0.25 SENSITIVE Sensitive     GENTAMICIN <=1 SENSITIVE Sensitive     IMIPENEM 2 SENSITIVE Sensitive     PIP/TAZO 8 SENSITIVE Sensitive     CEFEPIME 2 SENSITIVE Sensitive     * ABUNDANT PSEUDOMONAS AERUGINOSA    Lab Basic Metabolic Panel: Recent Labs  Lab  03/31/21 0418 03/31/21 0527 04/01/21 0311 04/02/21 1914 04/03/21 0401 04/03/21 1125 04/03/21 1742 04/03/21 2226 04/04/21 0245 04/04/21 1143  04/05/21 0011 04/05/21 0309 04/05/21 0430 04/05/21 0522 04/05/21 0753 04/01/2021 0505 03/16/2021 0645  NA 157*   < > 163*   < > 160*   < > 152*   < > 150*   < > 146* 147* 148*  --   --  141 142  K 4.7   < > 5.0   < > 6.1*   < > 5.6*  --  4.7  --   --  5.7* 5.5* 5.7* 5.7* 6.5* 6.9*  CL 120*  --  126*   < > 121*   < > 118*  --  116*  --   --  111  --   --   --  107 108  CO2 34*  --  35*   < > 32   < > 29  --  30  --   --  26  --   --   --  23 23  GLUCOSE 229*  --  273*   < > 272*   < > 273*  --  167*  --   --  181*  --   --   --  156* 136*  BUN 116*  --  93*   < > 63*   < > 57*  --  57*  --   --  87*  --   --   --  119* 118*  CREATININE 1.00  --  0.87   < > 0.87   < > 0.87  --  0.97  --   --  2.22*  --   --   --  3.80* 3.96*  CALCIUM 8.6*  --  8.6*   < > 8.8*   < > 8.3*  --  8.2*  --   --  8.3*  --   --   --  7.8* 7.6*  MG 4.5*  --  4.5*  --  3.5*  --   --   --  3.1*  --   --  3.3*  --   --   --  3.5*  --   PHOS 4.2  --  4.3  --   --   --   --   --  4.5  --   --  9.3*  --   --   --  >30.0*  --    < > = values in this interval not displayed.   Liver Function Tests: Recent Labs  Lab 04/02/21 1023 04/03/21 1125 04/04/21 0245 04/05/21 0753 03/13/2021 0505  AST 201* 170* 178* 375* 1,028*  ALT 129* 118* 115* 146* 327*  ALKPHOS 142* 153* 136* 131* 146*  BILITOT 18.8* 23.0* 25.0* 33.4* 34.9*  PROT 5.9* 5.5* 4.9* 4.9* 4.7*  ALBUMIN 2.9* 2.6* 2.2* 2.2* 2.0*   No results for input(s): LIPASE, AMYLASE in the last 168 hours. Recent Labs  Lab 04/03/21 1343  AMMONIA 57*   CBC: Recent Labs  Lab 04/02/21 0436 04/03/21 0401 04/03/21 1125 04/03/21 1231 04/04/21 0245 04/05/21 0309 04/05/21 0430 03/09/2021 0505  WBC 13.7* 18.4*  --   --  18.3* 11.9*  --  14.4*  HGB 10.1* 10.5*  --  10.5* 9.3* 9.1* 9.5* 9.0*  HCT 28.1* 32.3*  --  31.0* 25.4* 27.5*  28.0* 27.1*  MCV 115.2* 111.8*  --   --  117.6* 115.1*  --  116.3*  PLT 40* 48* 51*  --  50* 53*  --  92*   Cardiac Enzymes: No results for input(s):  CKTOTAL, CKMB, CKMBINDEX, TROPONINI in the last 168 hours. Sepsis Labs: Recent Labs  Lab 04/03/21 0401 04/04/21 0245 04/05/21 0309 03/23/2021 0505  WBC 18.4* 18.3* 11.9* 14.4*    Procedures/Operations  8/16 intubation 8/16 right radial arterial line 8/16 left subclavian central line 8/18 r radial artery thrombectomy   Omar Personathaniel M Anacleto Batterman 04/04/2021, 1:52 PM

## 2021-04-09 NOTE — Progress Notes (Addendum)
NAME:  Edwin West, MRN:  [id number], DOB:  1961-01-06, LOS: 13 ADMISSION DATE:  03/27/2021, CONSULTATION DATE:  04/08/2021 REFERRING MD:  Criss Alvine, CHIEF COMPLAINT:  Respiratory Failure/ Shock 2/2 Covid 36   Brief Hx  60 year old male who presented 8/16 with severe ARDS secondary to COVID-19, streptococcal pneumonia, multiorgan failure.   Pertinent  Medical History  Cirrhosis - no recent drinking   Significant Hospital Events: Including procedures, antibiotic start and stop dates in addition to other pertinent events   8/16 Admit with R PNA, COVID +. Urine strep + Pf 87 8/17 Peak 26/ Pplat 21, propofol stopped with elevated triglycerides. BC+ for strep pneumoniae 8/18 proned x 16 hours and remain supine. S/p right radial A thrombectomy 8/19 Initially responded to increased PEEP 8/20 Worsening hypoxemia. Proned 8/21 Amio for afib, weaning pressors 8/22 Remains prone 8/23 P/F ratio improved so proning held. Off pressors, diuresis started 8/24 Free water and d5W for hypernatremia 8/26 Off versed drip, PEEP/Fio2 down to 5/40. Gi consulted for elevated LFTs  Interim History / Subjective:  RN reports patient continues to have agonal respirations on vent  Hypotensive despite two vasopressors   Objective   Blood pressure (!) 80/43, pulse (!) 59, temperature 98.4 F (36.9 C), resp. rate (!) 37, height 5\' 9"  (1.753 m), weight 98.4 kg, SpO2 (!) 87 %. CVP:  [15 mmHg-18 mmHg] 18 mmHg  Vent Mode: PRVC FiO2 (%):  [90 %-100 %] 100 % Set Rate:  [34 bmp] 34 bmp Vt Set:  [560 mL] 560 mL PEEP:  [5 cmH20-8 cmH20] 8 cmH20 Plateau Pressure:  [22 cmH20-23 cmH20] 22 cmH20   Intake/Output Summary (Last 24 hours) at 03/14/2021 0845 Last data filed at 03/27/2021 0600 Gross per 24 hour  Intake 1014.3 ml  Output 25 ml  Net 989.3 ml   Filed Weights   04/03/21 0500 04/04/21 0500 04/05/21 0442  Weight: 94.6 kg 94.3 kg 98.4 kg   Physical Exam: General:  critically ill appearing adult male  lying in bed on vent, jaundice HEENT: MM pink/moist, ETT, icterus  Neuro: obtunded  CV: s1s2 regular, brady in 30's, no m/r/g PULM: agonal respirations on vent, lungs distant but clear anterior  GI: soft, protuberant, bsx4 active  Extremities: warm/dry, anasarca  Skin: no rashes or lesions  Resolved Hospital Problem list   Hypothermia  Lactic Acidosis  Septic shock  Assessment & Plan:   ARDS secondary to Streptococcal/Pseudomonal PNA + COVID Septic Shock (POA) Strep PNA on R with associated bacteremia likely cause of initial decline, additional COVID positive. Pseudomonal HAP  -PRVC 8cc/kg until son arrives, family wishes to remove mechanical ventilation  -PRN morphine for air hunger / SOB   AKI secondary to ATN Hyperkalemia -hold further labs  -supportive care   Paroxysmal Atrial Fibrillation  Bradycardia in setting of Hyperkalemia  -tele monitoring  -treated overnight and this am for hyperkalemia   Hepatorenal Syndrome Decompensated Cirrhosis 2/2 ?EtOH Transaminitis  Reported no recent use.  Lactulose held due to diarrhea.    -comfort measures / no further aggressive interventions  Acute metabolic encephalopathy secondary critical illness, sedation, uremia -comfort care  Possible Ileus proved on KUB -stop TF  Right radial thrombus provoked 2/2 line in setting of COVID-19 s/p thrombectomy -no anticoagulation due to thrombocytopenia -no further interventions  Thrombocytopenia  In setting of sepsis + known prior ETOH use / cirrhosis; Low-grade DIC -comfort care  Hypernatremia  -comfort care   Hyperglycemia -comfort care   Goals of care -DNR  -discussed patients  status in detail with daughter, son-in-law and daughter's mother at bedside. They indicate that he has fought hard but understand that this is his time.  Daughter would like to stop aggressive support and transition to comfort when son arrives.  Support offered. Chaplain called.    Best Practice  (right click and "Reselect all SmartList Selections" daily)  Diet/type: TF DVT prophylaxis: SCDs.  Holding heparin due to thrombocytopenia GI prophylaxis: PPI Lines: Central line Foley:  yes, still needed  Code Status:  full code Last date of multidisciplinary goals of care discussion: see above   Critical Care Time: 32 minutes   Canary Brim, MSN, APRN, NP-C, AGACNP-BC [facility] 03/14/2021, 8:46 AM   Please see [email or web address] for pager details.   From 7A-7P if no response, please call [phone] After hours, please call ELink [phone]

## 2021-04-09 NOTE — Progress Notes (Signed)
S: Called to bedside for bradycardia, hypotension. RN reports patient has been hypotensive overnight -vasopressin added, hyperkalemia treated overnight.  Levophed increased to  O: Blood pressure 67/39, pulse 32, temperature 98.4 F (36.9 C), resp. rate (!) 33, height 5\' 9"  (1.753 m), weight 98.4 kg, SpO2 92 %.  On vent with agonal respirations Jaundice   A: ARDS secondary to COVID  Streptococcal PNA Multi-system Organ Failure  Hyperkalemia  AKI  Anemia  Thrombocytopenia    P: Increase ceiling of levophed to 40 mcg Now bicarbonate, insulin, dextrose, calcium  Continue vasopressin  Daughter ) called for update.  Reviewed patient decline overnight - increased vasopressin needs, worsening hyperkalemia.  Discussed despite treatment for these issues, he is dying. Daughter indicates she is on the way to the hospital and based on Palliative discussion from 8/28 was aware he may not survive. Support offered to daughter.  RN informed of family pending visit.    9/28, MSN, APRN, NP-C, AGACNP-BC [facility] 03/27/2021, 8:06 AM   Please see [email or web address] for pager details.   From 7A-7P if no response, please call [phone] After hours, please call ELink [phone]

## 2021-04-09 DEATH — deceased

## 2021-04-16 ENCOUNTER — Ambulatory Visit: Payer: Commercial Managed Care - PPO | Admitting: Gastroenterology

## 2022-08-31 IMAGING — DX DG LUMBAR SPINE COMPLETE 4+V
5 series · 5 of 5 positions shown · non-contrast
Comparison: Chest radiographs 04/03/2018

CLINICAL DATA: Lumbar radiculopathy for 3 months

EXAM:
LUMBAR SPINE - COMPLETE 4+ VIEW

[l-spine ap]
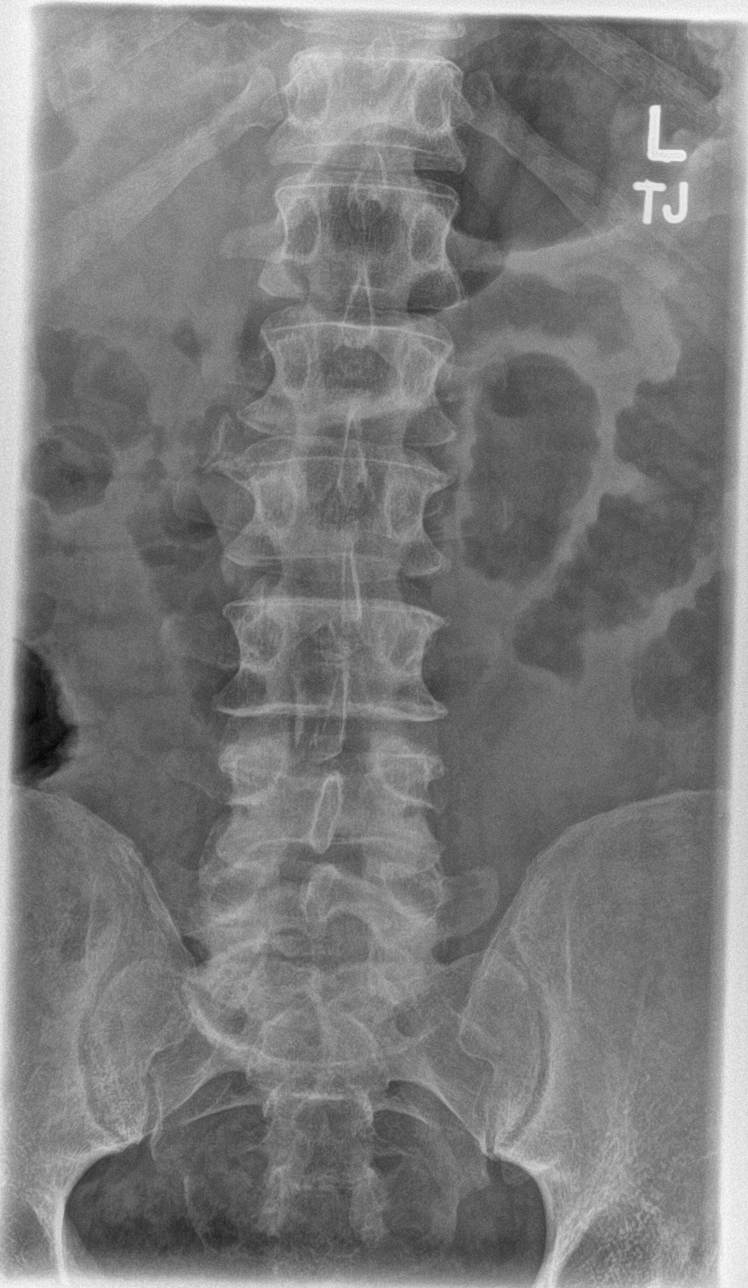

[l-spine obl (1 of 2)]
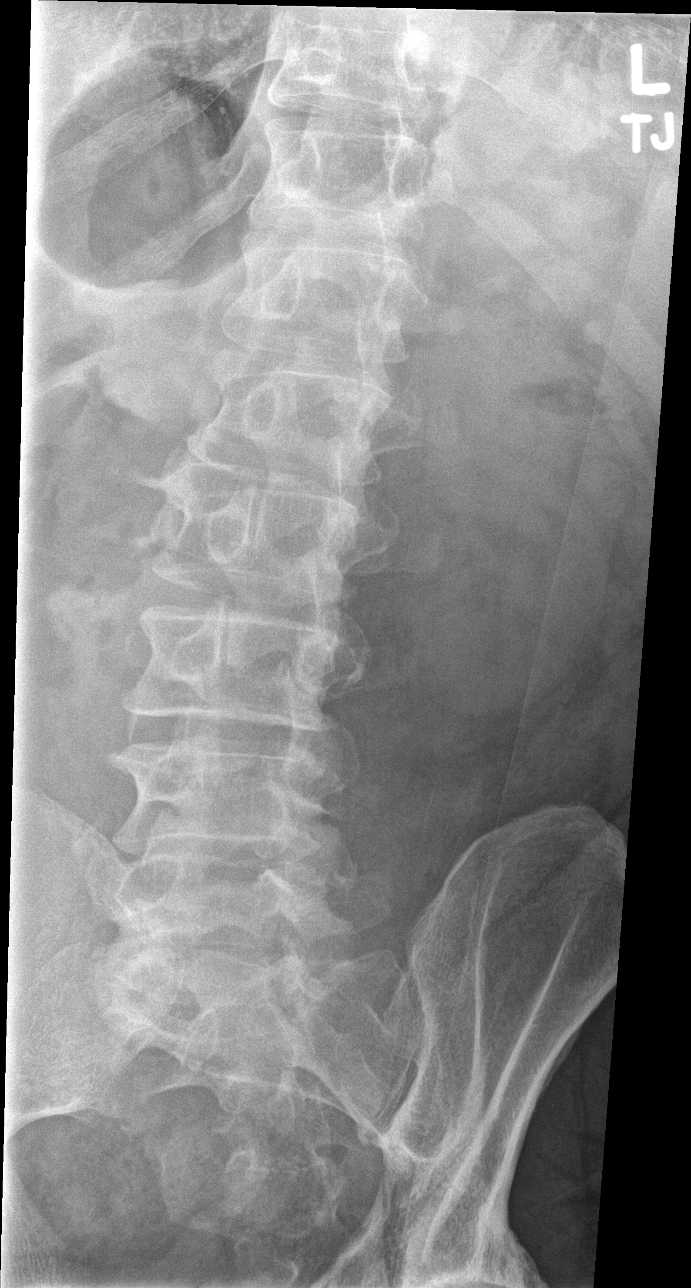

[l-spine obl (2 of 2)]
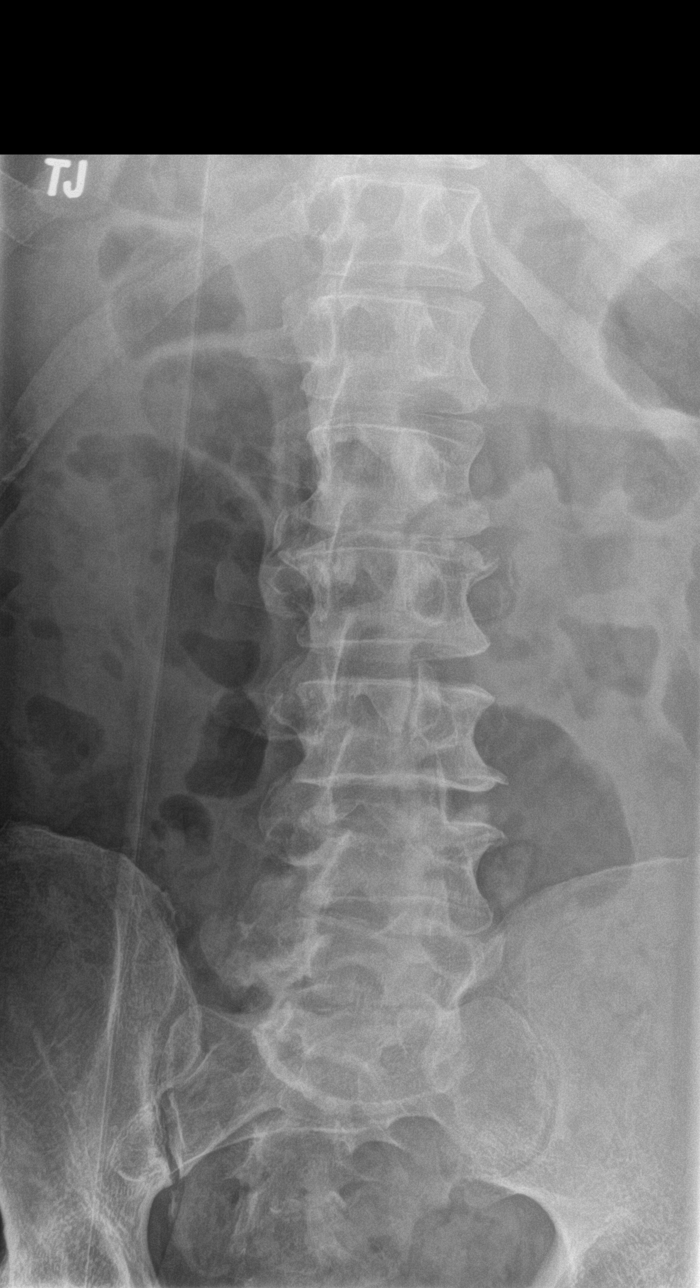

[l-spine lat]
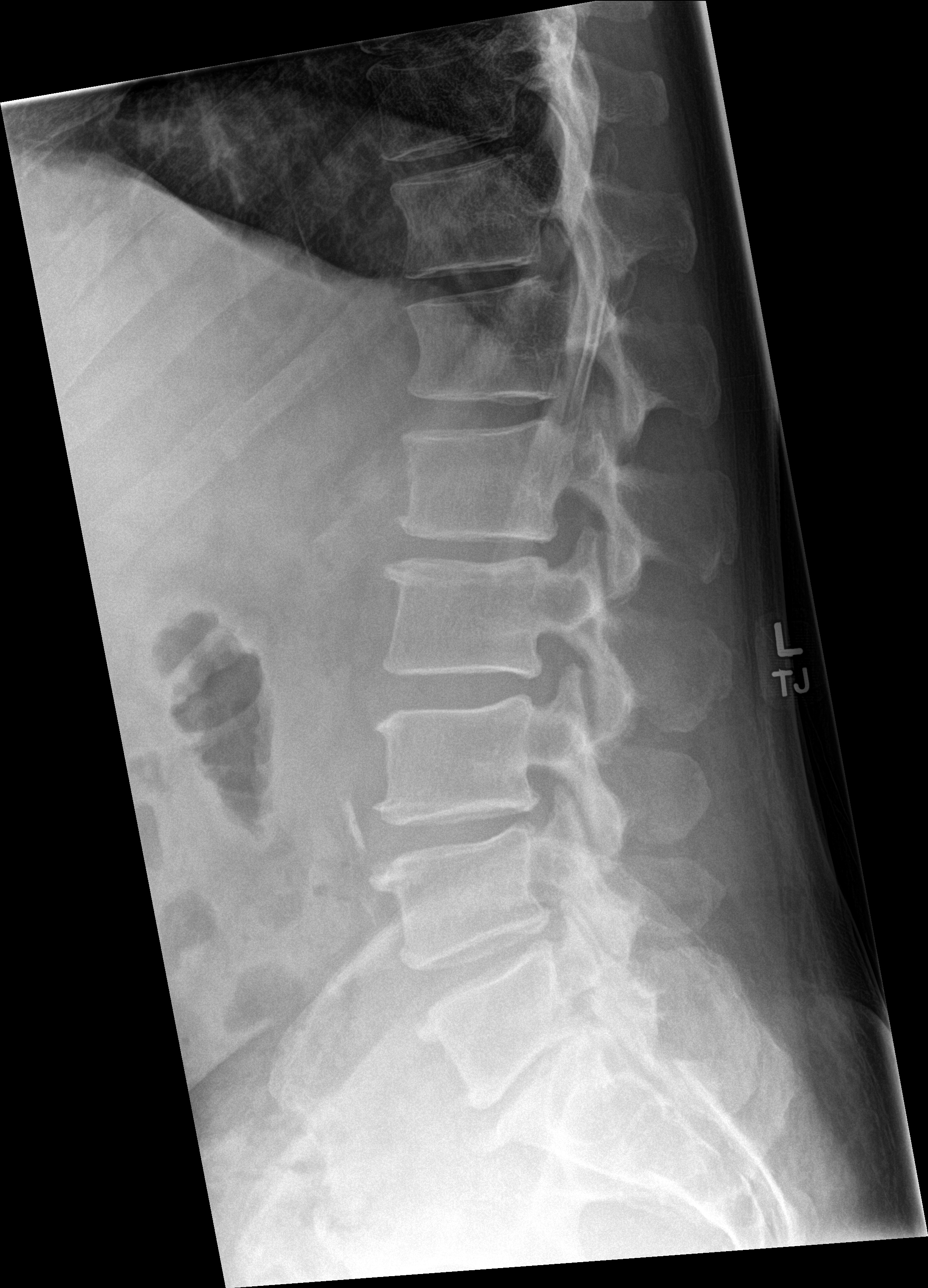

[l-spine l5/s1]
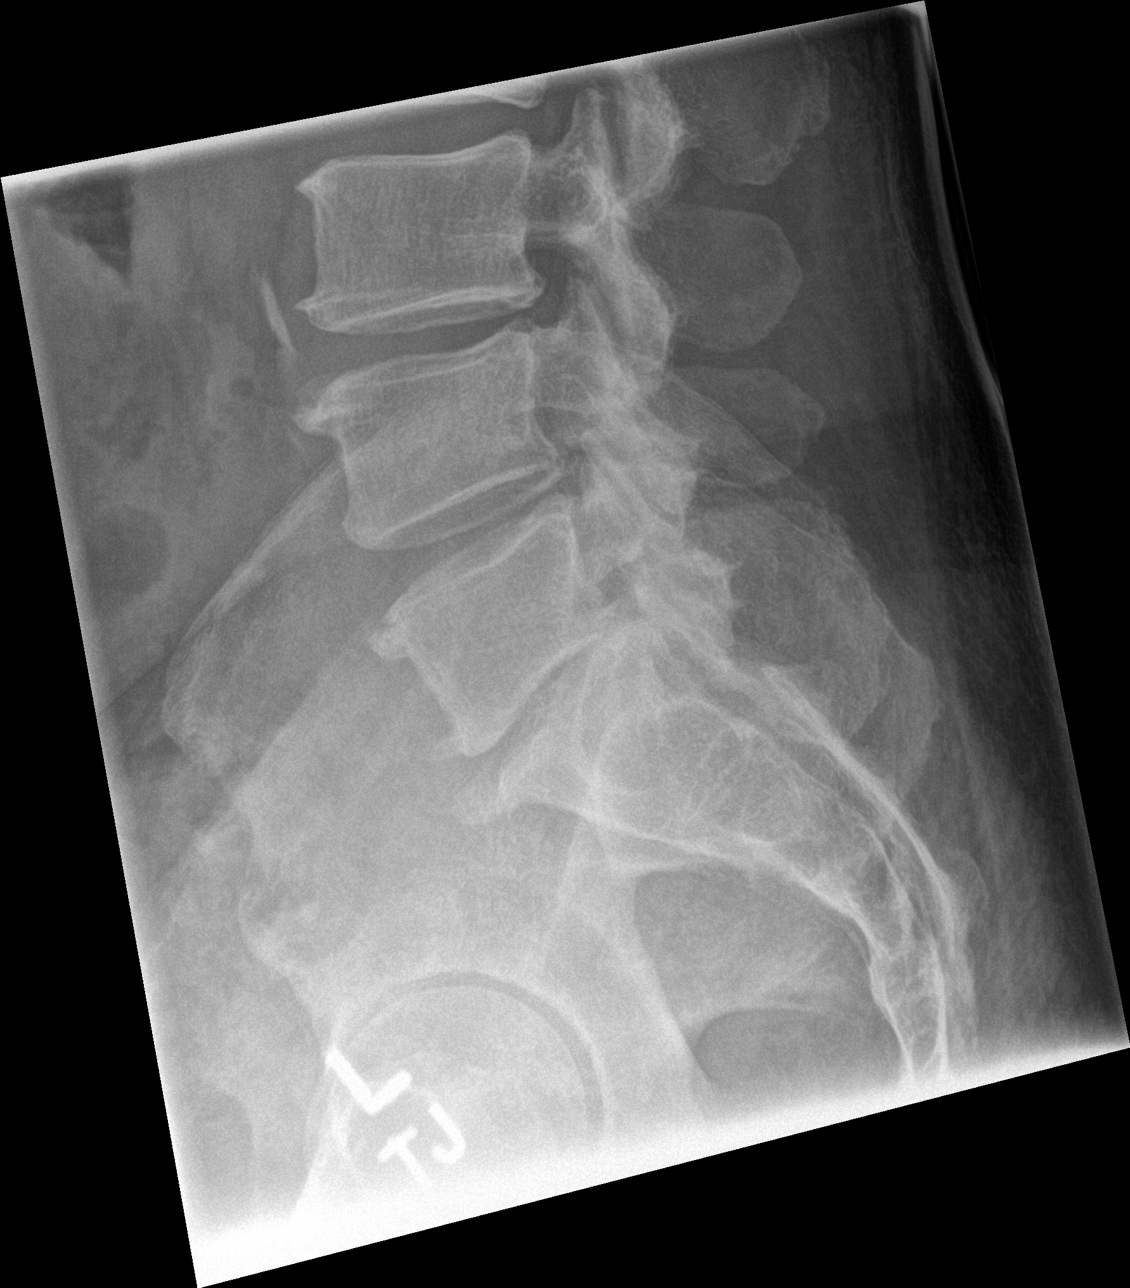

[5 of 5 positions shown; findings below may reference images not displayed]

FINDINGS: 6 non-rib-bearing lumbar type vertebra.

Scattered disc space narrowing and minimal endplate spur formation.

Minimal chronic anterior height loss of L1, unchanged since 3836,
potentially developmental.

No acute fracture, subluxation, or bone destruction.

No definite spondylolysis.

SI joints preserved.
IMPRESSION: Mild scattered degenerative disc disease changes lumbar spine.

No acute abnormalities.
